# Patient Record
Sex: Male | Born: 1950 | Race: White | Hispanic: No | Marital: Single | Smoking: Never smoker
Health system: Southern US, Community
[De-identification: ages and names within clinical notes are randomized; demographics above are authoritative.]

## PROBLEM LIST (undated history)

## (undated) DIAGNOSIS — M6281 Muscle weakness (generalized): Secondary | ICD-10-CM

## (undated) DIAGNOSIS — E871 Hypo-osmolality and hyponatremia: Secondary | ICD-10-CM

## (undated) DIAGNOSIS — D649 Anemia, unspecified: Secondary | ICD-10-CM

## (undated) DIAGNOSIS — C8330 Diffuse large B-cell lymphoma, unspecified site: Secondary | ICD-10-CM

## (undated) DIAGNOSIS — R1310 Dysphagia, unspecified: Secondary | ICD-10-CM

## (undated) DIAGNOSIS — D72829 Elevated white blood cell count, unspecified: Secondary | ICD-10-CM

## (undated) DIAGNOSIS — R2681 Unsteadiness on feet: Secondary | ICD-10-CM

## (undated) DIAGNOSIS — D179 Benign lipomatous neoplasm, unspecified: Secondary | ICD-10-CM

## (undated) DIAGNOSIS — N179 Acute kidney failure, unspecified: Secondary | ICD-10-CM

## (undated) DIAGNOSIS — S069X9A Unspecified intracranial injury with loss of consciousness of unspecified duration, initial encounter: Secondary | ICD-10-CM

## (undated) HISTORY — PX: APPENDECTOMY: SHX54

---
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
## 1974-07-25 DIAGNOSIS — S069X9A Unspecified intracranial injury with loss of consciousness of unspecified duration, initial encounter: Secondary | ICD-10-CM

## 1974-07-25 DIAGNOSIS — S069XAA Unspecified intracranial injury with loss of consciousness status unknown, initial encounter: Secondary | ICD-10-CM

## 1974-07-25 HISTORY — PX: BRAIN SURGERY: SHX531

## 1974-07-25 HISTORY — DX: Unspecified intracranial injury with loss of consciousness of unspecified duration, initial encounter: S06.9X9A

## 1974-07-25 HISTORY — DX: Unspecified intracranial injury with loss of consciousness status unknown, initial encounter: S06.9XAA

## 2016-12-01 DIAGNOSIS — L720 Epidermal cyst: Secondary | ICD-10-CM | POA: Diagnosis not present

## 2017-03-25 DIAGNOSIS — E860 Dehydration: Secondary | ICD-10-CM | POA: Diagnosis present

## 2017-03-25 DIAGNOSIS — R1310 Dysphagia, unspecified: Secondary | ICD-10-CM

## 2017-03-25 HISTORY — DX: Dysphagia, unspecified: R13.10

## 2017-03-27 ENCOUNTER — Emergency Department (HOSPITAL_COMMUNITY): Payer: Medicare HMO

## 2017-03-27 ENCOUNTER — Encounter (HOSPITAL_COMMUNITY): Payer: Self-pay | Admitting: Internal Medicine

## 2017-03-27 ENCOUNTER — Inpatient Hospital Stay (HOSPITAL_COMMUNITY)
Admission: EM | Admit: 2017-03-27 | Discharge: 2017-04-11 | DRG: 823 | Disposition: A | Discharge status: Skilled Nursing Facility | Payer: Medicare HMO | Attending: Family Medicine | Admitting: Family Medicine

## 2017-03-27 ENCOUNTER — Inpatient Hospital Stay (HOSPITAL_COMMUNITY): Payer: Medicare HMO

## 2017-03-27 DIAGNOSIS — C8588 Other specified types of non-Hodgkin lymphoma, lymph nodes of multiple sites: Secondary | ICD-10-CM | POA: Diagnosis not present

## 2017-03-27 DIAGNOSIS — R911 Solitary pulmonary nodule: Secondary | ICD-10-CM

## 2017-03-27 DIAGNOSIS — R740 Nonspecific elevation of levels of transaminase and lactic acid dehydrogenase [LDH]: Secondary | ICD-10-CM | POA: Diagnosis present

## 2017-03-27 DIAGNOSIS — R2681 Unsteadiness on feet: Secondary | ICD-10-CM | POA: Diagnosis not present

## 2017-03-27 DIAGNOSIS — D72829 Elevated white blood cell count, unspecified: Secondary | ICD-10-CM

## 2017-03-27 DIAGNOSIS — C8580 Other specified types of non-Hodgkin lymphoma, unspecified site: Secondary | ICD-10-CM

## 2017-03-27 DIAGNOSIS — E860 Dehydration: Secondary | ICD-10-CM | POA: Diagnosis present

## 2017-03-27 DIAGNOSIS — R591 Generalized enlarged lymph nodes: Secondary | ICD-10-CM

## 2017-03-27 DIAGNOSIS — C8518 Unspecified B-cell lymphoma, lymph nodes of multiple sites: Secondary | ICD-10-CM | POA: Diagnosis not present

## 2017-03-27 DIAGNOSIS — M6281 Muscle weakness (generalized): Secondary | ICD-10-CM | POA: Diagnosis not present

## 2017-03-27 DIAGNOSIS — R945 Abnormal results of liver function studies: Secondary | ICD-10-CM | POA: Diagnosis present

## 2017-03-27 DIAGNOSIS — D7589 Other specified diseases of blood and blood-forming organs: Secondary | ICD-10-CM | POA: Diagnosis not present

## 2017-03-27 DIAGNOSIS — J90 Pleural effusion, not elsewhere classified: Secondary | ICD-10-CM

## 2017-03-27 DIAGNOSIS — E871 Hypo-osmolality and hyponatremia: Secondary | ICD-10-CM | POA: Diagnosis not present

## 2017-03-27 DIAGNOSIS — N2889 Other specified disorders of kidney and ureter: Secondary | ICD-10-CM | POA: Diagnosis not present

## 2017-03-27 DIAGNOSIS — E43 Unspecified severe protein-calorie malnutrition: Secondary | ICD-10-CM | POA: Diagnosis present

## 2017-03-27 DIAGNOSIS — R32 Unspecified urinary incontinence: Secondary | ICD-10-CM | POA: Diagnosis present

## 2017-03-27 DIAGNOSIS — L89152 Pressure ulcer of sacral region, stage 2: Secondary | ICD-10-CM | POA: Diagnosis present

## 2017-03-27 DIAGNOSIS — H6123 Impacted cerumen, bilateral: Secondary | ICD-10-CM | POA: Diagnosis present

## 2017-03-27 DIAGNOSIS — R599 Enlarged lymph nodes, unspecified: Secondary | ICD-10-CM

## 2017-03-27 DIAGNOSIS — R531 Weakness: Secondary | ICD-10-CM

## 2017-03-27 DIAGNOSIS — D649 Anemia, unspecified: Secondary | ICD-10-CM | POA: Diagnosis not present

## 2017-03-27 DIAGNOSIS — Z5111 Encounter for antineoplastic chemotherapy: Secondary | ICD-10-CM | POA: Diagnosis not present

## 2017-03-27 DIAGNOSIS — I313 Pericardial effusion (noninflammatory): Secondary | ICD-10-CM | POA: Diagnosis not present

## 2017-03-27 DIAGNOSIS — Z87820 Personal history of traumatic brain injury: Secondary | ICD-10-CM

## 2017-03-27 DIAGNOSIS — C8330 Diffuse large B-cell lymphoma, unspecified site: Secondary | ICD-10-CM | POA: Diagnosis not present

## 2017-03-27 DIAGNOSIS — Z6820 Body mass index (BMI) 20.0-20.9, adult: Secondary | ICD-10-CM | POA: Diagnosis not present

## 2017-03-27 DIAGNOSIS — C8510 Unspecified B-cell lymphoma, unspecified site: Secondary | ICD-10-CM | POA: Diagnosis not present

## 2017-03-27 DIAGNOSIS — N189 Chronic kidney disease, unspecified: Secondary | ICD-10-CM | POA: Diagnosis not present

## 2017-03-27 DIAGNOSIS — C8250 Diffuse follicle center lymphoma, unspecified site: Secondary | ICD-10-CM | POA: Diagnosis not present

## 2017-03-27 DIAGNOSIS — N179 Acute kidney failure, unspecified: Secondary | ICD-10-CM | POA: Diagnosis not present

## 2017-03-27 DIAGNOSIS — Z95828 Presence of other vascular implants and grafts: Secondary | ICD-10-CM | POA: Diagnosis not present

## 2017-03-27 DIAGNOSIS — E278 Other specified disorders of adrenal gland: Secondary | ICD-10-CM | POA: Diagnosis present

## 2017-03-27 DIAGNOSIS — R7402 Elevation of levels of lactic acid dehydrogenase (LDH): Secondary | ICD-10-CM

## 2017-03-27 DIAGNOSIS — I6789 Other cerebrovascular disease: Secondary | ICD-10-CM | POA: Diagnosis not present

## 2017-03-27 DIAGNOSIS — R1311 Dysphagia, oral phase: Secondary | ICD-10-CM | POA: Diagnosis not present

## 2017-03-27 DIAGNOSIS — R1310 Dysphagia, unspecified: Secondary | ICD-10-CM | POA: Diagnosis present

## 2017-03-27 DIAGNOSIS — R590 Localized enlarged lymph nodes: Secondary | ICD-10-CM | POA: Diagnosis not present

## 2017-03-27 DIAGNOSIS — J9811 Atelectasis: Secondary | ICD-10-CM | POA: Diagnosis present

## 2017-03-27 DIAGNOSIS — R4182 Altered mental status, unspecified: Secondary | ICD-10-CM | POA: Diagnosis not present

## 2017-03-27 DIAGNOSIS — R109 Unspecified abdominal pain: Secondary | ICD-10-CM | POA: Diagnosis not present

## 2017-03-27 DIAGNOSIS — Z452 Encounter for adjustment and management of vascular access device: Secondary | ICD-10-CM | POA: Diagnosis not present

## 2017-03-27 DIAGNOSIS — R221 Localized swelling, mass and lump, neck: Secondary | ICD-10-CM | POA: Diagnosis not present

## 2017-03-27 DIAGNOSIS — N1330 Unspecified hydronephrosis: Secondary | ICD-10-CM | POA: Diagnosis not present

## 2017-03-27 DIAGNOSIS — L8990 Pressure ulcer of unspecified site, unspecified stage: Secondary | ICD-10-CM | POA: Insufficient documentation

## 2017-03-27 DIAGNOSIS — C8590 Non-Hodgkin lymphoma, unspecified, unspecified site: Secondary | ICD-10-CM | POA: Diagnosis not present

## 2017-03-27 DIAGNOSIS — L049 Acute lymphadenitis, unspecified: Secondary | ICD-10-CM | POA: Diagnosis not present

## 2017-03-27 DIAGNOSIS — C8519 Unspecified B-cell lymphoma, extranodal and solid organ sites: Secondary | ICD-10-CM | POA: Diagnosis not present

## 2017-03-27 DIAGNOSIS — R918 Other nonspecific abnormal finding of lung field: Secondary | ICD-10-CM | POA: Diagnosis not present

## 2017-03-27 DIAGNOSIS — R488 Other symbolic dysfunctions: Secondary | ICD-10-CM | POA: Diagnosis not present

## 2017-03-27 DIAGNOSIS — R944 Abnormal results of kidney function studies: Secondary | ICD-10-CM | POA: Diagnosis not present

## 2017-03-27 HISTORY — DX: Unspecified intracranial injury with loss of consciousness of unspecified duration, initial encounter: S06.9X9A

## 2017-03-27 HISTORY — DX: Benign lipomatous neoplasm, unspecified: D17.9

## 2017-03-27 HISTORY — DX: Dysphagia, unspecified: R13.10

## 2017-03-27 LAB — DIFFERENTIAL
Basophils Absolute: 0 10*3/uL (ref 0.0–0.1)
Basophils Relative: 0 %
EOS PCT: 1 %
Eosinophils Absolute: 0.1 10*3/uL (ref 0.0–0.7)
LYMPHS ABS: 0.9 10*3/uL (ref 0.7–4.0)
LYMPHS PCT: 6 %
MONO ABS: 1.3 10*3/uL — AB (ref 0.1–1.0)
Monocytes Relative: 8 %
Neutro Abs: 13.1 10*3/uL — ABNORMAL HIGH (ref 1.7–7.7)
Neutrophils Relative %: 85 %

## 2017-03-27 LAB — COMPREHENSIVE METABOLIC PANEL
ALK PHOS: 84 U/L (ref 38–126)
ALT: 28 U/L (ref 17–63)
AST: 38 U/L (ref 15–41)
Albumin: 2.6 g/dL — ABNORMAL LOW (ref 3.5–5.0)
Anion gap: 8 (ref 5–15)
BILIRUBIN TOTAL: 0.7 mg/dL (ref 0.3–1.2)
BUN: 31 mg/dL — AB (ref 6–20)
CHLORIDE: 100 mmol/L — AB (ref 101–111)
CO2: 29 mmol/L (ref 22–32)
CREATININE: 2.01 mg/dL — AB (ref 0.61–1.24)
Calcium: 11.4 mg/dL — ABNORMAL HIGH (ref 8.9–10.3)
GFR calc Af Amer: 38 mL/min — ABNORMAL LOW (ref >60)
GFR, EST NON AFRICAN AMERICAN: 33 mL/min — AB (ref >60)
Glucose, Bld: 114 mg/dL — ABNORMAL HIGH (ref 65–99)
Potassium: 4.1 mmol/L (ref 3.5–5.1)
Sodium: 137 mmol/L (ref 135–145)
Total Protein: 6.6 g/dL (ref 6.5–8.1)

## 2017-03-27 LAB — CREATININE, URINE, RANDOM: Creatinine, Urine: 109.9 mg/dL

## 2017-03-27 LAB — CBC
HEMATOCRIT: 33.6 % — AB (ref 39.0–52.0)
HEMOGLOBIN: 10.7 g/dL — AB (ref 13.0–17.0)
MCH: 28.9 pg (ref 26.0–34.0)
MCHC: 31.8 g/dL (ref 30.0–36.0)
MCV: 90.8 fL (ref 78.0–100.0)
Platelets: 437 10*3/uL — ABNORMAL HIGH (ref 150–400)
RBC: 3.7 MIL/uL — AB (ref 4.22–5.81)
RDW: 13.9 % (ref 11.5–15.5)
WBC: 14.4 10*3/uL — AB (ref 4.0–10.5)

## 2017-03-27 LAB — URINALYSIS, ROUTINE W REFLEX MICROSCOPIC
Bilirubin Urine: NEGATIVE
GLUCOSE, UA: NEGATIVE mg/dL
Hgb urine dipstick: NEGATIVE
Ketones, ur: NEGATIVE mg/dL
LEUKOCYTES UA: NEGATIVE
Nitrite: NEGATIVE
PH: 6 (ref 5.0–8.0)
PROTEIN: NEGATIVE mg/dL
Specific Gravity, Urine: 1.013 (ref 1.005–1.030)

## 2017-03-27 LAB — PHOSPHORUS: Phosphorus: 4.3 mg/dL (ref 2.5–4.6)

## 2017-03-27 LAB — SODIUM, URINE, RANDOM: SODIUM UR: 29 mmol/L

## 2017-03-27 LAB — MAGNESIUM: MAGNESIUM: 2.1 mg/dL (ref 1.7–2.4)

## 2017-03-27 LAB — I-STAT CG4 LACTIC ACID, ED: Lactic Acid, Venous: 1.33 mmol/L (ref 0.5–1.9)

## 2017-03-27 LAB — TSH: TSH: 1.929 u[IU]/mL (ref 0.350–4.500)

## 2017-03-27 LAB — I-STAT TROPONIN, ED: TROPONIN I, POC: 0 ng/mL (ref 0.00–0.08)

## 2017-03-27 LAB — SAVE SMEAR

## 2017-03-27 MED ORDER — VANCOMYCIN HCL IN DEXTROSE 1-5 GM/200ML-% IV SOLN
1000.0000 mg | Freq: Once | INTRAVENOUS | Status: DC
  Filled 2017-03-27: qty 200

## 2017-03-27 MED ORDER — PIPERACILLIN-TAZOBACTAM 3.375 G IVPB: 3.3750 g | Freq: Three times a day (TID) | INTRAVENOUS | Status: DC

## 2017-03-27 MED ORDER — SODIUM CHLORIDE 0.9 % IV SOLN
1000.0000 mL | INTRAVENOUS | Status: DC
  Administered 2017-03-27: 1000 mL via INTRAVENOUS

## 2017-03-27 MED ORDER — PIPERACILLIN-TAZOBACTAM 3.375 G IVPB 30 MIN
3.3750 g | Freq: Once | INTRAVENOUS | Status: DC
  Administered 2017-03-27: 3.375 g via INTRAVENOUS
  Filled 2017-03-27: qty 50

## 2017-03-27 MED ORDER — HEPARIN SODIUM (PORCINE) 5000 UNIT/ML IJ SOLN
5000.0000 [IU] | Freq: Three times a day (TID) | INTRAMUSCULAR | Status: DC
  Administered 2017-03-27 – 2017-03-28 (×4): 5000 [IU] via SUBCUTANEOUS
  Filled 2017-03-27 (×4): qty 1

## 2017-03-27 MED ORDER — ACETAMINOPHEN 325 MG PO TABS: 650.0000 mg | ORAL_TABLET | Freq: Four times a day (QID) | ORAL | Status: DC | PRN

## 2017-03-27 MED ORDER — POTASSIUM CHLORIDE IN NACL 20-0.9 MEQ/L-% IV SOLN
INTRAVENOUS | Status: AC
  Administered 2017-03-27 (×2): via INTRAVENOUS
  Filled 2017-03-27 (×3): qty 1000

## 2017-03-27 MED ORDER — ACETAMINOPHEN 650 MG RE SUPP: 650.0000 mg | Freq: Four times a day (QID) | RECTAL | Status: DC | PRN

## 2017-03-27 MED ORDER — VANCOMYCIN HCL IN DEXTROSE 1-5 GM/200ML-% IV SOLN: 1000.0000 mg | INTRAVENOUS | Status: DC

## 2017-03-27 MED ORDER — THIAMINE HCL 100 MG/ML IJ SOLN
200.0000 mg | Freq: Every day | INTRAMUSCULAR | Status: DC
  Administered 2017-03-27 – 2017-03-28 (×2): 200 mg via INTRAVENOUS
  Filled 2017-03-27 (×2): qty 2
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
## 2017-03-27 NOTE — ED Notes (Signed)
Patient refusing urinary catheter at this time.  States he does not need to urinate and does not want to be catheterized.
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
## 2017-03-27 NOTE — ED Notes (Signed)
Patient transported to X-ray 

## 2017-03-27 NOTE — Progress Notes (Signed)
Admission note:  Arrival Method: via stretcher from ED Mental Orientation: Alert and oriented x1. Telemetry: box #6, CCMD notified. Assessment: see doc flow sheet Skin: warm and dry.  IV: L AC NSL. Pain: 0-10. Safety Measures: discussed and reviewed with pt and family, verbalized understanding.  Fall Prevention Safety Plan: bed alarm on, call bell and telephone within reach. Admission Screening: in progress  6700 Orientation: Patient has been oriented to the unit, staff and to the room.
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
## 2017-03-27 NOTE — ED Provider Notes (Signed)
Level V caveat patient brain injured. History is obtained from his family who accompanies him and from paramedics. Patient has been choking on his food and has been eating less for the past 1 month. Today he was too weak to get off the toilet and he has had trouble walking due to generalized weakness progressively worsening for approximately the past week. Patient denies pain anywhere. Denies shortness of breath. On exam he is alert.Marland Kitchen HEENT exam no facial asymmetry, mucous membranes dry Moves all extremity as well. Cranial nerves II through XII intact. Lungs clear auscultation abdomen soft nontender. Heart tachycardic, regular rhythm   Orlie Dakin, MD 03/27/17 1146

## 2017-03-27 NOTE — Progress Notes (Signed)
Pharmacy Antibiotic Note  Tyler [name] is a 66 y.o. male admitted on 03/27/2017 with sepsis.    Plan: Zosyn 3.375 gm iv q8h Van 1 g q24h  Monitor renal fx, cx, vt prn     Temp (24hrs), Avg:97.1 F (36.2 C), Min:96 F (35.6 C), Max:98.1 F (36.7 C)   Recent Labs Lab 03/27/17 1134 03/27/17 1150  WBC 14.4*  --   CREATININE 2.01*  --   LATICACIDVEN  --  1.33    CrCl cannot be calculated (Unknown ideal weight.).    Allergies not on file  Levester Fresh, PharmD, BCPS, BCCCP Clinical Pharmacist Clinical phone for 03/27/2017 from 7a-3:30p: [phone] If after 3:30p, please call main pharmacy at: [phone] 03/27/2017 1:47 PM

## 2017-03-27 NOTE — ED Notes (Signed)
Bladder scan results: > 431mL

## 2017-03-28 ENCOUNTER — Encounter (HOSPITAL_COMMUNITY): Payer: Self-pay

## 2017-03-28 ENCOUNTER — Inpatient Hospital Stay (HOSPITAL_COMMUNITY): Payer: Medicare HMO

## 2017-03-28 DIAGNOSIS — L8990 Pressure ulcer of unspecified site, unspecified stage: Secondary | ICD-10-CM | POA: Insufficient documentation

## 2017-03-28 LAB — COMPREHENSIVE METABOLIC PANEL
ALT: 47 U/L (ref 17–63)
AST: 55 U/L — AB (ref 15–41)
Albumin: 2.4 g/dL — ABNORMAL LOW (ref 3.5–5.0)
Alkaline Phosphatase: 98 U/L (ref 38–126)
Anion gap: 7 (ref 5–15)
BILIRUBIN TOTAL: 1.4 mg/dL — AB (ref 0.3–1.2)
BUN: 30 mg/dL — ABNORMAL HIGH (ref 6–20)
CHLORIDE: 105 mmol/L (ref 101–111)
CO2: 27 mmol/L (ref 22–32)
Calcium: 11.1 mg/dL — ABNORMAL HIGH (ref 8.9–10.3)
Creatinine, Ser: 2.07 mg/dL — ABNORMAL HIGH (ref 0.61–1.24)
GFR, EST AFRICAN AMERICAN: 36 mL/min — AB (ref >60)
GFR, EST NON AFRICAN AMERICAN: 31 mL/min — AB (ref >60)
Glucose, Bld: 93 mg/dL (ref 65–99)
POTASSIUM: 4 mmol/L (ref 3.5–5.1)
Sodium: 139 mmol/L (ref 135–145)
TOTAL PROTEIN: 6.2 g/dL — AB (ref 6.5–8.1)

## 2017-03-28 LAB — CBC
HCT: 32 % — ABNORMAL LOW (ref 39.0–52.0)
Hemoglobin: 10.2 g/dL — ABNORMAL LOW (ref 13.0–17.0)
MCH: 29 pg (ref 26.0–34.0)
MCHC: 31.9 g/dL (ref 30.0–36.0)
MCV: 90.9 fL (ref 78.0–100.0)
PLATELETS: 431 10*3/uL — AB (ref 150–400)
RBC: 3.52 MIL/uL — AB (ref 4.22–5.81)
RDW: 14.1 % (ref 11.5–15.5)
WBC: 12.8 10*3/uL — AB (ref 4.0–10.5)

## 2017-03-28 LAB — KAPPA/LAMBDA LIGHT CHAINS
Kappa free light chain: 77.1 mg/L — ABNORMAL HIGH (ref 3.3–19.4)
Kappa, lambda light chain ratio: 0.73 (ref 0.26–1.65)
Lambda free light chains: 105.2 mg/L — ABNORMAL HIGH (ref 5.7–26.3)

## 2017-03-28 LAB — PTH, INTACT AND CALCIUM
CALCIUM TOTAL (PTH): 11.2 mg/dL — AB (ref 8.6–10.2)
PTH: 7 pg/mL — ABNORMAL LOW (ref 15–65)

## 2017-03-28 MED ORDER — SODIUM CHLORIDE 0.9 % IV SOLN
INTRAVENOUS | Status: DC
  Administered 2017-03-28 – 2017-04-03 (×9): via INTRAVENOUS

## 2017-03-28 MED ORDER — ZOLEDRONIC ACID 4 MG/5ML IV CONC
4.0000 mg | Freq: Once | INTRAVENOUS | Status: AC
  Administered 2017-03-28: 4 mg via INTRAVENOUS
  Filled 2017-03-28: qty 5

## 2017-03-28 MED ORDER — VITAMIN B-1 100 MG PO TABS
200.0000 mg | ORAL_TABLET | Freq: Every day | ORAL | Status: DC
  Administered 2017-03-29: 200 mg via ORAL
  Filled 2017-03-28: qty 2

## 2017-03-28 MED ORDER — ZOLEDRONIC ACID 4 MG/5ML IV CONC
4.0000 mg | Freq: Once | INTRAVENOUS | Status: DC
  Filled 2017-03-28: qty 5

## 2017-03-28 MED ORDER — ENSURE ENLIVE PO LIQD
237.0000 mL | Freq: Two times a day (BID) | ORAL | Status: DC
  Administered 2017-03-28 – 2017-03-31 (×4): 237 mL via ORAL

## 2017-03-28 NOTE — Evaluation (Signed)
Occupational Therapy Evaluation Patient Details Name: Tyler [name] MRN: [id number] DOB: [birth year] Today's Date: 03/28/2017    History of Present Illness 66 y.o. 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   Clinical Impression   At his baseline, pt walks and performs self care independently. He lives in the basement of his aunt and uncle's home. Pt presents with baseline impairment in cognition, generalized weakness, poor activity tolerance and decreased standing balance. He requires min assist for mobility and ADL and is a high fall risk. Pt will need rehab upon discharge in a SNF. His siblings were in the room during the session and have concerns about pt continuing to live with his aunt and uncle as they are elderly. Will follow acutely.    Follow Up Recommendations  SNF;Supervision/Assistance - 24 hour    Equipment Recommendations       Recommendations for Other Services       Precautions / Restrictions Precautions Precautions: Fall Restrictions Weight Bearing Restrictions: No      Mobility Bed Mobility Overal bed mobility: Needs Assistance Bed Mobility: Supine to Sit     Supine to sit: Min guard     General bed mobility comments: Used bedrail; inefficient movement with overall decr weight shifting; got "stuck" while trying to flex trunk and elevate, so held thigh with hands to help with the weight shift towards EOB  Transfers Overall transfer level: Needs assistance Equipment used: Rolling walker (2 wheeled) Transfers: Sit to/from Stand Sit to Stand: Min assist         General transfer comment: Min assist to steady; slow rise; indicated he didn't feel right standing without external support, so brought RW to him    Balance Overall balance assessment: Needs assistance Sitting-balance support: Feet supported Sitting balance-Leahy Scale:  Fair       Standing balance-Leahy Scale: Poor Standing balance comment: Dependent on UE suport                           ADL either performed or assessed with clinical judgement   ADL Overall ADL's : Needs assistance/impaired Eating/Feeding: Set up;Sitting   Grooming: Supervision/safety;Sitting   Upper Body Bathing: Set up;Sitting   Lower Body Bathing: Minimal assistance;Sit to/from stand   Upper Body Dressing : Minimal assistance;Sitting   Lower Body Dressing: Minimal assistance;Sit to/from stand   Toilet Transfer: Minimal assistance;Ambulation;RW   Toileting- Clothing Manipulation and Hygiene: Minimal assistance;Sit to/from stand       Functional mobility during ADLs: Minimal assistance;Rolling walker       Vision Baseline Vision/History: Wears glasses Wears Glasses: Distance only Patient Visual Report: No change from baseline       Perception     Praxis      Pertinent Vitals/Pain Pain Assessment: No/denies pain     Hand Dominance Right   Extremity/Trunk Assessment Upper Extremity Assessment Upper Extremity Assessment: Generalized weakness   Lower Extremity Assessment Lower Extremity Assessment: Generalized weakness       Communication Communication Communication: No difficulties   Cognition Arousal/Alertness: Awake/alert Behavior During Therapy: WFL for tasks assessed/performed Overall Cognitive Status: History of cognitive impairments - at baseline                                     General  Comments       Exercises     Shoulder Instructions      Home Living Family/patient expects to be discharged to:: Skilled nursing facility Living Arrangements: Other relatives                               Additional Comments: elderly aunt and uncle      Prior Functioning/Environment Level of Independence: Independent        Comments: typically walks long distances, relies on assist of aunt and uncle  for IADL        OT Problem List: Decreased strength;Decreased activity tolerance;Impaired balance (sitting and/or standing);Decreased coordination;Decreased cognition;Decreased knowledge of use of DME or AE;Decreased safety awareness      OT Treatment/Interventions: Self-care/ADL training;DME and/or AE instruction;Patient/family education;Balance training;Therapeutic activities    OT Goals(Current goals can be found in the care plan section) Acute Rehab OT Goals Patient Stated Goal: did not state OT Goal Formulation: With patient Time For Goal Achievement: 04/11/17 Potential to Achieve Goals: Good ADL Goals Pt Will Perform Grooming: with supervision;standing Pt Will Perform Upper Body Dressing: with supervision;with set-up;sitting Pt Will Perform Lower Body Dressing: with supervision;with set-up;sit to/from stand Pt Will Transfer to Toilet: with supervision;ambulating;regular height toilet Pt Will Perform Toileting - Clothing Manipulation and hygiene: with supervision;sit to/from stand  OT Frequency: Min 2X/week   Barriers to D/C: Decreased caregiver support          Co-evaluation              AM-PAC PT "6 Clicks" Daily Activity     Outcome Measure Help from another person eating meals?: None Help from another person taking care of personal grooming?: A Little Help from another person toileting, which includes using toliet, bedpan, or urinal?: A Little Help from another person bathing (including washing, rinsing, drying)?: A Little Help from another person to put on and taking off regular upper body clothing?: A Little Help from another person to put on and taking off regular lower body clothing?: A Little 6 Click Score: 19   End of Session Equipment Utilized During Treatment: Gait belt;Rolling walker  Activity Tolerance: Patient tolerated treatment well Patient left: in chair;with call bell/phone within reach;with chair alarm set;with family/visitor present  OT  Visit Diagnosis: Unsteadiness on feet (R26.81);Other abnormalities of gait and mobility (R26.89);Muscle weakness (generalized) (M62.81);Adult, failure to thrive (R62.7);Other symptoms and signs involving cognitive function                Time: 8466-5993 OT Time Calculation (min): 28 min Charges:  OT General Charges $OT Visit: 1 Visit OT Evaluation $OT Eval Moderate Complexity: 1 Mod G-Codes:     03-31-2017 Nestor Lewandowsky, OTR/L Pager: [phone]  Werner Lean, [place] March 31, 2017, 4:29 PM

## 2017-03-28 NOTE — Evaluation (Signed)
Physical Therapy Evaluation Patient Details Name: Tyler [name] MRN: [id number] DOB: [birth year] Today's Date: 03/28/2017   History of Present Illness  66 y.o. 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  Clinical Impression   Pt admitted with above diagnosis. Pt currently with functional limitations due to the deficits listed below (see PT Problem List). Prior to this illness leading to admission, Tyler [name] was able to walk independently community distances; Now requires assist and UE support for steadiness in standing and walking; At this point, SNF is very appropriate to maximize independence and safety with mobility; After post-acute rehab, I agree taht it is time to look for a safe long-term living situation; Pt will benefit from skilled PT to increase their independence and safety with mobility to allow discharge to the venue listed below.       Follow Up Recommendations SNF    Equipment Recommendations  Rolling walker with 5" wheels;3in1 (PT)    Recommendations for Other Services       Precautions / Restrictions Precautions Precautions: Fall      Mobility  Bed Mobility Overal bed mobility: Needs Assistance Bed Mobility: Supine to Sit     Supine to sit: Min guard     General bed mobility comments: Used bedrail; inefficient movement with overall decr weight shifting; got "stuck" while trying to flex trunk and elevate, so held thigh with hands to help with the weight shift towards EOB  Transfers Overall transfer level: Needs assistance Equipment used: None Transfers: Sit to/from Stand Sit to Stand: Min assist         General transfer comment: Min assist to steady; slow rise; indicated he didn't feel right standing without external support, so brought RW to him  Ambulation/Gait Ambulation/Gait assistance: Min assist;Mod assist;+2  safety/equipment Ambulation Distance (Feet): 60 Feet Assistive device: Rolling walker (2 wheeled) Gait Pattern/deviations: Step-through pattern;Trunk flexed   Gait velocity interpretation: Below normal speed for age/gender General Gait Details: tending to have hips, knees and trunk slightly flexed throughout gait cycle; multiple cues for RW proximity and posture; with fatigue, noted tends to have RW too far in front; tended to reach for recliner prematurely  Stairs            Wheelchair Mobility    Modified Rankin (Stroke Patients Only)       Balance Overall balance assessment: Needs assistance Sitting-balance support: Feet supported Sitting balance-Tyler [name] Scale: Fair       Standing balance-Tyler [name] Scale: Poor Standing balance comment: Dependent on UE suport                             Pertinent Vitals/Pain Pain Assessment: No/denies pain    Home Living Family/patient expects to be discharged to:: Skilled nursing facility Living Arrangements: Other relatives                    Prior Function Level of Independence: Independent         Comments: [place]ks to walk     Hand Dominance        Extremity/Trunk Assessment   Upper Extremity Assessment Upper Extremity Assessment: Defer to OT evaluation    Lower Extremity Assessment Lower Extremity Assessment: Generalized weakness       Communication   Communication: No difficulties  Cognition Arousal/Alertness: Awake/alert Behavior During Therapy: Tyler [name] for tasks assessed/performed  Overall Cognitive Status: History of cognitive impairments - at baseline                                        General Comments      Exercises     Assessment/Plan    PT Assessment Patient needs continued PT services  PT Problem List Decreased strength;Decreased activity tolerance;Decreased balance;Decreased mobility;Decreased coordination;Decreased cognition;Decreased knowledge of use of  DME;Decreased safety awareness;Decreased knowledge of precautions       PT Treatment Interventions DME instruction;Gait training;Functional mobility training;Therapeutic activities;Therapeutic exercise;Balance training;Neuromuscular re-education;Cognitive remediation;Patient/family education    PT Goals (Current goals can be found in the Care Plan section)  Acute Rehab PT Goals Patient Stated Goal: did not state PT Goal Formulation: Patient unable to participate in goal setting Time For Goal Achievement: 04/11/17 Potential to Achieve Goals: Good    Frequency Min 2X/week   Barriers to discharge        Co-evaluation               AM-PAC PT "6 Clicks" Daily Activity  Outcome Measure Difficulty turning over in bed (including adjusting bedclothes, sheets and blankets)?: A Lot Difficulty moving from lying on back to sitting on the side of the bed? : A Lot Difficulty sitting down on and standing up from a chair with arms (e.g., wheelchair, bedside commode, etc,.)?: A Lot Help needed moving to and from a bed to chair (including a wheelchair)?: A Lot Help needed walking in hospital room?: A Lot Help needed climbing 3-5 steps with a railing? : A Lot 6 Click Score: 12    End of Session Equipment Utilized During Treatment: Gait belt Activity Tolerance: Patient tolerated treatment well Patient left: in chair;with call bell/phone within reach;with chair alarm set;with family/visitor present Nurse Communication: Mobility status PT Visit Diagnosis: Unsteadiness on feet (R26.81);Other abnormalities of gait and mobility (R26.89);Muscle weakness (generalized) (M62.81)    Time: 7510-2585 PT Time Calculation (min) (ACUTE ONLY): 32 min   Charges:   PT Evaluation $PT Eval Moderate Complexity: 1 Mod     PT G Codes:        Tyler [name], PT  Acute Rehabilitation Services Pager [phone] Office [phone]   Tyler [name] 03/28/2017, 4:13 PM

## 2017-03-28 NOTE — Consult Note (Signed)
[facility] Nurse wound consult note Reason for Consult: Consult requested for sacrum.   Wound type: Stage 2 pressure injury noted as present on admission to sacrum by the bedside nurse.   Pressure Injury POA: Yes Measurement: .4X.4X.1cm Wound bed: pink and dry Drainage (amount, consistency, odor) no odor or drainage Periwound: intact skin surrounding Dressing procedure/placement/frequency: Foam dressing to protect and promote healing. Please re-consult if further assistance is needed.  Thank-you,  Julien Girt MSN, [place], [place], [place], [place]

## 2017-03-28 NOTE — Progress Notes (Signed)
PT Cancellation Note  Patient Details Name: SANEL STEMMER MRN: [id number] DOB: 1951-01-24   Cancelled Treatment:    Reason Eval/Treat Not Completed: Patient at procedure or test/unavailable   At time of attempt, going off floor for xray;   Will follow up later today as time allows;  Otherwise, will follow up for PT tomorrow;   Thank you,  Roney Marion, PT  Acute Rehabilitation Services Pager [phone] Office [place] 03/28/2017, 11:33 AM
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
## 2017-03-28 NOTE — Progress Notes (Signed)
OT Cancellation Note  Patient Details Name: Tyler [name] MRN: [id number] DOB: 12-08-1950   Cancelled Treatment:    Reason Eval/Treat Not Completed: Patient at procedure or test/ unavailable (Pt leaving for xray. Will reattempt.)  Malka So 03/28/2017, 10:55 AM  03/28/2017 Nestor Lewandowsky, OTR/L Pager: [phone]

## 2017-03-28 NOTE — Consult Note (Signed)
[facility] CM Primary Care Navigator  03/28/2017  CORGAN MORMILE 1951-06-30 [id number]   Met with patient, sister Izora Gala) and brother [name]) at the bedside to identify possible discharge needs. Patient noted to have confusion and information were verified with patient's uncle by his brother and sister. Both sister and brother (from out of state) report that patient had "weakness - unable to get up/ walk" and "loss of appetite" that had led to this admission. Patientendorsed Dr. Jenean Lindau or [facility]- whom he use to see from years ago as primary care provider with [facility] at [facility] (called and confirmed with Shirlean Mylar and states will be able to see patient back at the office if needed).    Patient's uncle confirmed that patient has not been on any medications at home.  Sister states using [facility] in [place] if he needs any "immediate medications".  Patient's brother states that uncle has been providingtransportation for patient, otherwise, patient "walks around town".  [employer] transportation benefits discussed with patient and siblings.  Patient lives with aunt Angelita Ingles) and uncle Shireen Quan provides assistance with his needs at home.  Discharge plan is undetermined for now per brother and sister.  Still awaiting for PT/ OT evaluation/ recommendation and physician order at this time.  Patient's sister and brother voiced understanding to callprimary provider's officewhen patientgets home,to schedulea post discharge follow-upwithin a week or sooner if needed. Patient letter (with primary provider's contact number) was provided as a reminder.  Explained to patient, brother and sister regarding[facility] CM services available for health management but they denied any current needs or concerns at this time. They expressedunderstandingto seek referral from primary provider to [facility] care management if deemed necessaryfor services in the  future.  [facility] care management information provided for future needs that may arise.  For questions, please contact:  Dannielle Huh, BSN, RN- [facility] Primary Care Navigator  Telephone: [phone] [place]

## 2017-03-28 NOTE — Progress Notes (Signed)
Medicine attending: I examined this patient today together with resident physician Dr. Ina Homes and I concur with his evaluation and management plan which we discussed together.  Ongoing evaluation for unexplained weakness, weight loss, and hypercalcemia.  Please see separate attending admission note for complete details.

## 2017-03-28 NOTE — Progress Notes (Signed)
Initial Nutrition Assessment  DOCUMENTATION CODES:   Severe malnutrition in context of chronic illness  INTERVENTION:   Ensure Enlive po BID, each supplement provides 350 kcal and 20 grams of protein  Magic cup BID with meals, each supplement provides 290 kcal and 9 grams of protein   NUTRITION DIAGNOSIS:   Malnutrition (Severe) related to chronic illness (suspected malignancy with hypercalcemia, wt loss, dysphagia, weakness) as evidenced by severe depletion of body fat, severe depletion of muscle mass.  GOAL:   Patient will meet greater than or equal to 90% of their needs  MONITOR:   PO intake, Supplement acceptance, Labs, Weight trends  REASON FOR ASSESSMENT:   Consult Assessment of nutrition requirement/status  ASSESSMENT:   66 yo male admitted with generalized weakness, dysphagia and w tloss of 1 month duration, hypercalcemia. Pt with hx of TBI over 40 years ago  Uncle reporting that over the past month pt had stopped eating and appears to gag on food per MD notes. On visit today, pt reports he eats 3-4 meals per day. He states he eats eggs at breakfast and some other "stuff" and then he eats at "[employer] for lunch. But pt unable to elaborate much more regarding po intake.  Noted pt oriented x 2, definitely appeared confused on visit today.  Diet advanced to Regular this AM post SLP evaluation, pt had not eaten breakfast on visit today  Pt reports UBW around 170-180 pounds but current wt 151 pounds. >/= 11% wt loss, Unsure of time frame, no previous wt encounters  Nutrition-Focused physical exam completed. Findings are mild/moderate to severe fat depletion, mild/moderate to severe muscle depletion, and no edema.   Labs: corrected calcium 12.4 (albumin 2.4 Meds: NS with KCl at 125 ml/hr, thiamine, zometa  Diet Order:  Diet regular Room service appropriate? Yes; Fluid consistency: Thin  Skin:  Wound (see comment) (stage II buttock)  Last BM:  no documented  BM  Height:   Ht Readings from Last 1 Encounters:  03/28/17 6' (1.829 m)    Weight:   Wt Readings from Last 1 Encounters:  03/28/17 151 lb 14.4 oz (68.9 kg)    Ideal Body Weight:     BMI:  Body mass index is 20.6 kg/m.  Estimated Nutritional Needs:   Kcal:  1900-2100 kcals  Protein:  95-105 g  Fluid:  >/= 1.9 L  EDUCATION NEEDS:   No education needs identified at this time  [place], [place], LDN [phone] Pager  [phone] Weekend/On-Call Pager

## 2017-03-28 NOTE — Progress Notes (Addendum)
   Subjective: Doing well this AM. Has no questions or concerns. Continues to state that he has no issues eating/drinking and has been doing fine at home. Alert and oriented but concentration diminished. Denies urinary retention or incontinence, muscle weakness, dysphagia.   Objective: Vital signs in last 24 hours: Vitals:   03/27/17 1521 03/27/17 1718 03/27/17 2225 03/28/17 0519  BP: 98/75 124/73 125/65 128/68  Pulse: (!) 111 (!) 108 (!) 104 (!) 107  Resp: 18 17 (!) 24 (!) 24  Temp: 99 F (37.2 C) 99 F (37.2 C) 98.2 F (36.8 C) 98.3 F (36.8 C)  TempSrc: Oral Oral Oral Oral  SpO2: 99% 100% 98% 97%  Weight:   151 lb 14.4 oz (68.9 kg)    Physical Exam  Constitutional: He is oriented to person, place, and time.  Cachetic appearing  HENT:  Mouth/Throat: Oropharynx is clear and moist.  Cardiovascular: Normal rate, regular rhythm, normal heart sounds and intact distal pulses.   Pulmonary/Chest: Effort normal and breath sounds normal. He has no wheezes.  Abdominal: Soft. Bowel sounds are normal.  Musculoskeletal: He exhibits no edema.  Neurological: He is alert and oriented to person, place, and time.  Patellar reflexes diminished in LEs   Assessment/Plan: Active Problems:   Generalized weakness   History of traumatic brain injury   Hypercalcemia   AKI (acute kidney injury) ([place])   Normocytic anemia  1. Hypercalcemia  - Corrected Ca 12.5 with albumin 2.4  - Protein gap < 4, Alk phos 98, PTH appropriately suppressed at 7, TSH WNL - Checking SPEP and Kappa/Lambda light chains - Primary concern right now is malignancy. With his AKI and anemia we need to rule out plasma cell dyscrasias including MM. However, with his symptoms of dysphagia a barium swallow study would help to rule out squamous cell esophageal cancer. - Will continue work-up. Dehydration can cause hypercalcemia, however, with several hours of IVF his calcium has not decreased therefore it is less likely due to  dehydration   - Treat with Zoledronic acid 4 mg over 30 minutes  - Speech consult: no overt signs of dysphagia or aspiration will resume regular diet  2. Generalized Weakness  - Associated symptoms include decreased PO intake due to dysphagia, urinary retention, and weight loss - No health screening in the past   - CT head illustrating no acute infarct, generalized atrophy, dilated ventricles out of proportion to the amount of atrophy indicating possible NPH.  - Likely 2/2 hypercalcemia  3. AKI - Baseline creatinine unknown, Cr 2.01 on admission  - Trend: 2.01 -> 2.09 - FeNa indicative of pre-renal pathology  - UA unremarkable with no protein, hgb, RBCs, or casts - Renal ultrasound showing right hydronephrosis with no hypoechogenicity  - Abdominal x-ray - Continue IVF and rule out MM  Dispo: Anticipated discharge in approximately > 1 day(s).   Ina Homes, MD 03/28/2017, 6:32 AM My Pager: [phone]

## 2017-03-28 NOTE — Evaluation (Signed)
Clinical/Bedside Swallow Evaluation Patient Details  Name: Tyler [name] MRN: [id number] Date of Birth: 08-Sep-1950  Today's Date: 03/28/2017 Time: SLP Start Time (ACUTE ONLY): 0865 SLP Stop Time (ACUTE ONLY): 0951 SLP Time Calculation (min) (ACUTE ONLY): 20 min  Past Medical History:  Past Medical History:  Diagnosis Date  . AKI (acute kidney injury) ([place]) 03/2017  . Dehydration 03/2017  . Dysphagia 03/2017  . Family history of adverse reaction to anesthesia    sister has nausea  . Fatty tumor   . History of traumatic brain injury    Past Surgical History:  Past Surgical History:  Procedure Laterality Date  . APPENDECTOMY    . BRAIN SURGERY  1976   HPI:  66 y.o. 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 Per MD notes, uncle states that over the last month he has stopped eating and appears to gag on food. The gagging does not always occur and he does not appear to have the same problem with drinking fluids. He has since become progressively weak.  Started on sepsis protocol, undergoing w/u.    Assessment / Plan / Recommendation Clinical Impression  Pt presents with functional oropharyngeal swallow marked by strong mastication, brisk swallow response, no overt s/s of aspiration.  No focal deficits; no issues with swallowing/respiratory reciprocity.  Pt passed three oz water test without deficit.  No indication today of dysphagia -per chart review, symptoms are intermittent.  For now, recommend resuming a regular diet, thin liquids. Please reconsult SLP if s/s of dysphagia recur.   SLP Visit Diagnosis: Dysphagia, unspecified (R13.10)    Aspiration Risk  No limitations    Diet Recommendation     Medication Administration: Whole meds with liquid    Other  Recommendations Oral Care Recommendations: Oral care BID   Follow up Recommendations        Frequency and  Duration            Prognosis        Swallow Study   General HPI: 66 y.o. 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 Uncle at bedside, states that over the last month he has stopped eating and appears to gag on food. The gagging does not always occur and he does not appear to have the same problem with drinking fluids. He has since become progressively weak.  Started on sepsis protocol, undergoing w/u.  Type of Study: Bedside Swallow Evaluation Previous Swallow Assessment: none Diet Prior to this Study: NPO Temperature Spikes Noted: No Respiratory Status: Room air History of Recent Intubation: No Behavior/Cognition: Alert;Cooperative Oral Cavity Assessment: Within Functional Limits Oral Care Completed by SLP: No Oral Cavity - Dentition: Adequate natural dentition Vision: Functional for self-feeding Self-Feeding Abilities: Able to feed self Patient Positioning: Upright in bed Baseline Vocal Quality: Normal Volitional Cough: Strong Volitional Swallow: Able to elicit    Oral/Motor/Sensory Function Overall Oral Motor/Sensory Function: Within functional limits   Ice Chips Ice chips: Within functional limits   Thin Liquid Thin Liquid: Within functional limits    Nectar Thick Nectar Thick Liquid: Not tested   Honey Thick Honey Thick Liquid: Not tested   Puree Puree: Within functional limits   Solid   GO   Solid: Within functional limits        Tyler [name],  Tyler [name] 03/28/2017,9:51 AM  Estill Bamberg L. Tivis Ringer, [place] CCC/SLP Pager [phone]

## 2017-03-29 ENCOUNTER — Inpatient Hospital Stay (HOSPITAL_COMMUNITY): Payer: Medicare HMO

## 2017-03-29 DIAGNOSIS — E860 Dehydration: Secondary | ICD-10-CM

## 2017-03-29 DIAGNOSIS — R221 Localized swelling, mass and lump, neck: Secondary | ICD-10-CM

## 2017-03-29 LAB — BASIC METABOLIC PANEL
Anion gap: 6 (ref 5–15)
BUN: 19 mg/dL (ref 6–20)
CHLORIDE: 102 mmol/L (ref 101–111)
CO2: 27 mmol/L (ref 22–32)
CREATININE: 1.63 mg/dL — AB (ref 0.61–1.24)
Calcium: 9.8 mg/dL (ref 8.9–10.3)
GFR calc non Af Amer: 42 mL/min — ABNORMAL LOW (ref >60)
GFR, EST AFRICAN AMERICAN: 49 mL/min — AB (ref >60)
Glucose, Bld: 104 mg/dL — ABNORMAL HIGH (ref 65–99)
POTASSIUM: 3.8 mmol/L (ref 3.5–5.1)
SODIUM: 135 mmol/L (ref 135–145)

## 2017-03-29 LAB — HEPATIC FUNCTION PANEL
ALBUMIN: 2.2 g/dL — AB (ref 3.5–5.0)
ALK PHOS: 121 U/L (ref 38–126)
ALT: 111 U/L — AB (ref 17–63)
AST: 141 U/L — AB (ref 15–41)
BILIRUBIN DIRECT: 1.1 mg/dL — AB (ref 0.1–0.5)
BILIRUBIN TOTAL: 1.7 mg/dL — AB (ref 0.3–1.2)
Indirect Bilirubin: 0.6 mg/dL (ref 0.3–0.9)
Total Protein: 5.6 g/dL — ABNORMAL LOW (ref 6.5–8.1)

## 2017-03-29 LAB — LACTATE DEHYDROGENASE: LDH: 262 U/L — ABNORMAL HIGH (ref 98–192)

## 2017-03-29 LAB — CBC
HEMATOCRIT: 32.1 % — AB (ref 39.0–52.0)
HEMOGLOBIN: 10.4 g/dL — AB (ref 13.0–17.0)
MCH: 29.5 pg (ref 26.0–34.0)
MCHC: 32.4 g/dL (ref 30.0–36.0)
MCV: 90.9 fL (ref 78.0–100.0)
PLATELETS: 419 10*3/uL — AB (ref 150–400)
RBC: 3.53 MIL/uL — AB (ref 4.22–5.81)
RDW: 14.4 % (ref 11.5–15.5)
WBC: 11.6 10*3/uL — ABNORMAL HIGH (ref 4.0–10.5)

## 2017-03-29 LAB — PROTIME-INR
INR: 1.07
PROTHROMBIN TIME: 13.9 s (ref 11.4–15.2)

## 2017-03-29 LAB — APTT: aPTT: 28 seconds (ref 24–36)

## 2017-03-29 MED ORDER — ALLOPURINOL 300 MG PO TABS
300.0000 mg | ORAL_TABLET | Freq: Every day | ORAL | Status: DC
  Administered 2017-03-29 – 2017-04-11 (×14): 300 mg via ORAL
  Filled 2017-03-29: qty 3
  Filled 2017-03-29 (×13): qty 1

## 2017-03-29 MED ORDER — SODIUM CHLORIDE 0.9 % IV SOLN
300.0000 mg | INTRAVENOUS | Status: DC
  Filled 2017-03-29: qty 300

## 2017-03-29 MED ORDER — IOPAMIDOL (ISOVUE-300) INJECTION 61%
15.0000 mL | INTRAVENOUS | Status: AC
  Administered 2017-03-29 (×2): 15 mL via ORAL

## 2017-03-29 NOTE — Progress Notes (Signed)
Medicine attending: I examined this patient today together with student physician Ms. Eston Esters and resident physician Dr. Ina Homes and I concur with her evaluation and management plan which we discussed together. CT chest images reviewed with radiologist and interventional radiologist.  Small palpable soft tissue nodule in the left neck appears to be the tip of the iceberg.  There is a underlying 5 x 6 cm soft tissue mass in the left neck, likely lymph nodes.  Additional areas of abnormality in the left mediastinum, left paravertebral area, right retrocrural area, and atypical nodularity above the right kidney.  Small left pleural effusion. Calcium and creatinine trending down with hydration and Zometa.  Urine output borderline.  We will add as needed loop diuretic. He has developed an acute transaminitis.  Only medication he received in the hospital was Zometa. LDH is mildly elevated but this may reflect acute inflammation in the liver. Findings remain suspicious for malignancy, favor lymphoma. His uncle and healthcare spokesperson, brother, and sister-in-law are present.  Status and management plan discussed.  Uncle will take responsibility to sign for consent for procedure.  We did discuss with the patient the need for a biopsy.  He does have some cognitive deficits and it is not clear that he fully comprehends everything.  His uncle gives his verbal consent to proceed and will sign any documentation necessary. We will get a CT abdomen pelvis today and anticipate biopsy of the left neck soft tissue mass tomorrow.

## 2017-03-29 NOTE — Progress Notes (Addendum)
Bladder scan shows >421ml in bladder. MD paged.  Foley inserted as ordered.
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
## 2017-03-29 NOTE — Care Management Note (Signed)
Case Management Note  Patient Details  Name: KRISTIAN MOGG MRN: [id number] Date of Birth: 1951-06-21  Subjective/Objective:                 Patient admitted from home with uncle. Hx TBI. Presented with generalized weakness, incontinence, dysphagia, and weight loss of 1 month duration; imaging acquired yesterday is concerning for possible malignancy, will continue to work up with an abdominal CT today and US guided biopsy of the left neck mass tomorrow.   Action/Plan:  CM will follow for DC planning  Expected Discharge Date:                  Expected Discharge Plan:     In-House Referral:     Discharge planning Services  CM Consult  Post Acute Care Choice:    Choice offered to:     DME Arranged:    DME Agency:     HH Arranged:    HH Agency:     Status of Service:  In process, will continue to follow  If discussed at Long Length of Stay Meetings, dates discussed:    Additional Comments:  Carles Collet, RN 03/29/2017, 12:05 PM
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
## 2017-03-30 ENCOUNTER — Ambulatory Visit
Admit: 2017-03-30 | Discharge: 2017-03-30 | Disposition: A | Discharge status: Home / Self Care | Payer: Commercial Managed Care - HMO | Attending: Radiation Oncology | Admitting: Radiation Oncology

## 2017-03-30 ENCOUNTER — Inpatient Hospital Stay (HOSPITAL_COMMUNITY): Payer: Medicare HMO

## 2017-03-30 ENCOUNTER — Encounter (HOSPITAL_COMMUNITY): Payer: Self-pay

## 2017-03-30 DIAGNOSIS — R591 Generalized enlarged lymph nodes: Secondary | ICD-10-CM

## 2017-03-30 DIAGNOSIS — R221 Localized swelling, mass and lump, neck: Secondary | ICD-10-CM

## 2017-03-30 DIAGNOSIS — R599 Enlarged lymph nodes, unspecified: Secondary | ICD-10-CM

## 2017-03-30 LAB — COMPREHENSIVE METABOLIC PANEL
ALK PHOS: 137 U/L — AB (ref 38–126)
ALT: 126 U/L — AB (ref 17–63)
AST: 115 U/L — AB (ref 15–41)
Albumin: 2.1 g/dL — ABNORMAL LOW (ref 3.5–5.0)
Anion gap: 7 (ref 5–15)
BUN: 16 mg/dL (ref 6–20)
CALCIUM: 8.4 mg/dL — AB (ref 8.9–10.3)
CO2: 23 mmol/L (ref 22–32)
CREATININE: 1.48 mg/dL — AB (ref 0.61–1.24)
Chloride: 101 mmol/L (ref 101–111)
GFR, EST AFRICAN AMERICAN: 55 mL/min — AB (ref >60)
GFR, EST NON AFRICAN AMERICAN: 47 mL/min — AB (ref >60)
Glucose, Bld: 105 mg/dL — ABNORMAL HIGH (ref 65–99)
Potassium: 3.6 mmol/L (ref 3.5–5.1)
Sodium: 131 mmol/L — ABNORMAL LOW (ref 135–145)
Total Bilirubin: 3.2 mg/dL — ABNORMAL HIGH (ref 0.3–1.2)
Total Protein: 5.4 g/dL — ABNORMAL LOW (ref 6.5–8.1)

## 2017-03-30 LAB — CBC
HCT: 29.1 % — ABNORMAL LOW (ref 39.0–52.0)
Hemoglobin: 9.6 g/dL — ABNORMAL LOW (ref 13.0–17.0)
MCH: 29.4 pg (ref 26.0–34.0)
MCHC: 33 g/dL (ref 30.0–36.0)
MCV: 89.3 fL (ref 78.0–100.0)
PLATELETS: 392 10*3/uL (ref 150–400)
RBC: 3.26 MIL/uL — AB (ref 4.22–5.81)
RDW: 14.2 % (ref 11.5–15.5)
WBC: 13.5 10*3/uL — AB (ref 4.0–10.5)

## 2017-03-30 LAB — MULTIPLE MYELOMA PANEL, SERUM
ALBUMIN/GLOB SERPL: 0.8 (ref 0.7–1.7)
ALPHA 1: 0.4 g/dL (ref 0.0–0.4)
ALPHA2 GLOB SERPL ELPH-MCNC: 0.9 g/dL (ref 0.4–1.0)
Albumin SerPl Elph-Mcnc: 2.6 g/dL — ABNORMAL LOW (ref 2.9–4.4)
B-GLOBULIN SERPL ELPH-MCNC: 0.8 g/dL (ref 0.7–1.3)
GAMMA GLOB SERPL ELPH-MCNC: 1.5 g/dL (ref 0.4–1.8)
GLOBULIN, TOTAL: 3.6 g/dL (ref 2.2–3.9)
IGG (IMMUNOGLOBIN G), SERUM: 1404 mg/dL (ref 700–1600)
IGM (IMMUNOGLOBULIN M), SRM: 126 mg/dL (ref 20–172)
IgA: 238 mg/dL (ref 61–437)
M PROTEIN SERPL ELPH-MCNC: 0.5 g/dL — AB
Total Protein ELP: 6.2 g/dL (ref 6.0–8.5)

## 2017-03-30 LAB — HEPATITIS C ANTIBODY (REFLEX): HCV Ab: 0.1 s/co ratio (ref 0.0–0.9)

## 2017-03-30 LAB — URIC ACID: URIC ACID, SERUM: 4.9 mg/dL (ref 4.4–7.6)

## 2017-03-30 LAB — HCV COMMENT:

## 2017-03-30 LAB — BETA 2 MICROGLOBULIN, SERUM: Beta-2 Microglobulin: 4.2 mg/L — ABNORMAL HIGH (ref 0.6–2.4)

## 2017-03-30 LAB — HIV ANTIBODY (ROUTINE TESTING W REFLEX): HIV Screen 4th Generation wRfx: NONREACTIVE

## 2017-03-30 MED ORDER — FENTANYL CITRATE (PF) 100 MCG/2ML IJ SOLN
INTRAMUSCULAR | Status: AC
  Filled 2017-03-30: qty 2

## 2017-03-30 MED ORDER — MIDAZOLAM HCL 2 MG/2ML IJ SOLN
INTRAMUSCULAR | Status: AC
  Filled 2017-03-30: qty 2

## 2017-03-30 MED ORDER — PREDNISONE 50 MG PO TABS
60.0000 mg | ORAL_TABLET | Freq: Every day | ORAL | Status: AC
  Administered 2017-03-30 – 2017-04-03 (×5): 60 mg via ORAL
  Filled 2017-03-30 (×3): qty 1

## 2017-03-30 MED ORDER — FENTANYL CITRATE (PF) 100 MCG/2ML IJ SOLN
INTRAMUSCULAR | Status: AC | PRN
  Administered 2017-03-30 (×2): 50 ug via INTRAVENOUS

## 2017-03-30 MED ORDER — LIDOCAINE HCL (PF) 1 % IJ SOLN
INTRAMUSCULAR | Status: AC
  Filled 2017-03-30: qty 30

## 2017-03-30 MED ORDER — MIDAZOLAM HCL 2 MG/2ML IJ SOLN
INTRAMUSCULAR | Status: AC | PRN
  Administered 2017-03-30 (×2): 1 mg via INTRAVENOUS

## 2017-03-30 MED ORDER — PREDNISONE 20 MG PO TABS
40.0000 mg | ORAL_TABLET | Freq: Every day | ORAL | Status: AC
  Administered 2017-03-30 – 2017-04-03 (×5): 40 mg via ORAL
  Filled 2017-03-30 (×7): qty 2
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
## 2017-03-30 NOTE — Procedures (Signed)
Interventional Radiology Procedure Note  Procedure: [place] core biopsy left neck mass  Complications: None  Estimated Blood Loss: None  Recommendations: - Path pending   Signed,  Criselda Peaches, MD
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
## 2017-03-30 NOTE — Progress Notes (Signed)
MD aware of HR 125 and temp 99.4

## 2017-03-30 NOTE — Sedation Documentation (Signed)
Patient is resting comfortably. 

## 2017-03-30 NOTE — Sedation Documentation (Signed)
Bandaid L neck intact

## 2017-03-30 NOTE — Plan of Care (Signed)
Problem: Health Behavior/Discharge Planning: Goal: Ability to manage health-related needs will improve Outcome: Progressing Spoke with family about need for biopsy and evaluation of liver mass in order to make plan for future treatment.
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
## 2017-03-30 NOTE — Progress Notes (Signed)
Medicine attending: I examined this patient today together with resident physician Dr. Rochele Pages and student Dr. Eston Esters and I concur with their evaluation and management plan which we discussed together. Foley catheter placed for more accurate I's and O's.  Some degree of urinary retention on bladder scan. Allopurinol started.  Uric acid this morning in normal range at 4.9. Both calcium and renal function continue to improve. Despite CT findings of bulky retroperitoneal lymphadenopathy and left paraspinal soft tissue masses with erosive changes and adjacent T12 and L4, patient denies any back pain.  Motor strength of his lower extremities remains normal at 5/5.  Reflexes absent but symmetric unchanged from prior exams. Persistent transaminitis and now elevation of bilirubin and alkaline phosphatase.  No gross biliary obstruction on CT scan but study was noncontrast.  Suspect we are seeing some biliary obstruction from bulky intra-abdominal lymphoma. 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  We have asked radiation and medical oncology to consult.  I would also like to get opinion from urology with respect to prophylactic placement of a right ureteral stent to protect his kidney until he is started on definitive treatment.
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
## 2017-03-31 DIAGNOSIS — N1330 Unspecified hydronephrosis: Secondary | ICD-10-CM

## 2017-03-31 DIAGNOSIS — R590 Localized enlarged lymph nodes: Secondary | ICD-10-CM

## 2017-03-31 DIAGNOSIS — R911 Solitary pulmonary nodule: Secondary | ICD-10-CM

## 2017-03-31 DIAGNOSIS — J90 Pleural effusion, not elsewhere classified: Secondary | ICD-10-CM

## 2017-03-31 DIAGNOSIS — R740 Nonspecific elevation of levels of transaminase and lactic acid dehydrogenase [LDH]: Secondary | ICD-10-CM

## 2017-03-31 DIAGNOSIS — R531 Weakness: Secondary | ICD-10-CM

## 2017-03-31 DIAGNOSIS — R221 Localized swelling, mass and lump, neck: Secondary | ICD-10-CM

## 2017-03-31 LAB — COMPREHENSIVE METABOLIC PANEL
ALBUMIN: 2.2 g/dL — AB (ref 3.5–5.0)
ALT: 105 U/L — AB (ref 17–63)
AST: 69 U/L — AB (ref 15–41)
Alkaline Phosphatase: 153 U/L — ABNORMAL HIGH (ref 38–126)
Anion gap: 6 (ref 5–15)
BILIRUBIN TOTAL: 2 mg/dL — AB (ref 0.3–1.2)
BUN: 18 mg/dL (ref 6–20)
CHLORIDE: 103 mmol/L (ref 101–111)
CO2: 22 mmol/L (ref 22–32)
CREATININE: 1.38 mg/dL — AB (ref 0.61–1.24)
Calcium: 8.3 mg/dL — ABNORMAL LOW (ref 8.9–10.3)
GFR calc Af Amer: 60 mL/min — ABNORMAL LOW (ref >60)
GFR, EST NON AFRICAN AMERICAN: 51 mL/min — AB (ref >60)
GLUCOSE: 153 mg/dL — AB (ref 65–99)
Potassium: 3.9 mmol/L (ref 3.5–5.1)
Sodium: 131 mmol/L — ABNORMAL LOW (ref 135–145)
Total Protein: 5.7 g/dL — ABNORMAL LOW (ref 6.5–8.1)

## 2017-03-31 LAB — CBC
HCT: 32.8 % — ABNORMAL LOW (ref 39.0–52.0)
Hemoglobin: 10.7 g/dL — ABNORMAL LOW (ref 13.0–17.0)
MCH: 29 pg (ref 26.0–34.0)
MCHC: 32.6 g/dL (ref 30.0–36.0)
MCV: 88.9 fL (ref 78.0–100.0)
PLATELETS: 358 10*3/uL (ref 150–400)
RBC: 3.69 MIL/uL — ABNORMAL LOW (ref 4.22–5.81)
RDW: 14 % (ref 11.5–15.5)
WBC: 10.2 10*3/uL (ref 4.0–10.5)

## 2017-03-31 MED ORDER — HEPARIN SODIUM (PORCINE) 5000 UNIT/ML IJ SOLN
5000.0000 [IU] | Freq: Three times a day (TID) | INTRAMUSCULAR | Status: DC
  Administered 2017-03-31 – 2017-04-03 (×7): 5000 [IU] via SUBCUTANEOUS
  Administered 2017-04-04: 5 [IU] via SUBCUTANEOUS
  Administered 2017-04-05 – 2017-04-11 (×15): 5000 [IU] via SUBCUTANEOUS
  Filled 2017-03-31 (×27): qty 1

## 2017-03-31 MED ORDER — ENSURE ENLIVE PO LIQD
237.0000 mL | Freq: Three times a day (TID) | ORAL | Status: DC
  Administered 2017-04-01 – 2017-04-11 (×14): 237 mL via ORAL

## 2017-03-31 NOTE — Progress Notes (Signed)
Per conversation with the Nurse who had discussions with others a part of the care for the patient we are not going to proceed with assisting with an Advanced Directive and [place] at this point.
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
## 2017-03-31 NOTE — Plan of Care (Signed)
Family continues to remain overly attentive to patients care and even instructs him that he needs more blankets (even though he tells them he's not cold). When taking meds, patient was drinking ensure and family repeatedly insisted that he drink more so the pills would go down.  Patient informed them that his meds were gone, but family would not listen.  RN assessment found that patient responded appropriately to most questions and appears mainly oriented.  Family and live-in caregivers have requested information on advance directives to "put things in order." RN has concerns that patient may not be included in these decisions.
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
## 2017-03-31 NOTE — Progress Notes (Signed)
Physical Therapy Treatment Patient Details Name: Tyler [name] MRN: [id number] DOB: 19-Nov-1950 Today's Date: 03/31/2017    History of Present Illness 66 y.o. 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    PT Comments    Patient able to ambulate 46' with RW and mod assist for balance.  Patient fatigued quickly with standing x2 min twice to be cleaned following BM's.  Agree with need for SNF at d/c.   Follow Up Recommendations  SNF;Supervision/Assistance - 24 hour     Equipment Recommendations  Rolling walker with 5" wheels;3in1 (PT)    Recommendations for Other Services       Precautions / Restrictions Precautions Precautions: Fall Restrictions Weight Bearing Restrictions: No    Mobility  Bed Mobility Overal bed mobility: Needs Assistance Bed Mobility: Supine to Sit;Sit to Supine     Supine to sit: Min assist Sit to supine: Min assist   General bed mobility comments: Patient required repeated verbal cueing to move to sitting EOB.  Assist to raise trunk.  Required assist to bring LE's onto bed to return to supine.  Transfers Overall transfer level: Needs assistance Equipment used: Rolling walker (2 wheeled) Transfers: Sit to/from Stand Sit to Stand: Min assist;Mod assist         General transfer comment: Repeated cues for hand placement.  Min assist to rise from elevated bed.  Mod assist to move stand <> sit on toilet.  Ambulation/Gait Ambulation/Gait assistance: Mod assist Ambulation Distance (Feet): 24 Feet Assistive device: Rolling walker (2 wheeled) Gait Pattern/deviations: Step-through pattern;Decreased stride length;Shuffle;Trunk flexed Gait velocity: decreased Gait velocity interpretation: Below normal speed for age/gender General Gait Details: Flexed posture with RW too far ahead of him.  Cues for upright posture and to stay close to RW.  Patient stood at toilet and at bed for pericare following BM, 2 min each time.   Stairs            Wheelchair Mobility    Modified Rankin (Stroke Patients Only)       Balance             Standing balance-Leahy Scale: Poor Standing balance comment: Dependent on UE suport                            Cognition Arousal/Alertness: Awake/alert Behavior During Therapy: WFL for tasks assessed/performed Overall Cognitive Status: History of cognitive impairments - at baseline                                        Exercises      General Comments        Pertinent Vitals/Pain Pain Assessment: No/denies pain    Home Living                      Prior Function            PT Goals (current goals can now be found in the care plan section) Acute Rehab PT Goals Patient Stated Goal: did not state Progress towards PT goals: Not progressing toward goals - comment (Limited today by needing to be cleaned x2.)    Frequency    Min 2X/week      PT Plan Current plan remains appropriate  Co-evaluation              AM-PAC PT "6 Clicks" Daily Activity  Outcome Measure  Difficulty turning over in bed (including adjusting bedclothes, sheets and blankets)?: A Lot Difficulty moving from lying on back to sitting on the side of the bed? : Unable Difficulty sitting down on and standing up from a chair with arms (e.g., wheelchair, bedside commode, etc,.)?: Unable Help needed moving to and from a bed to chair (including a wheelchair)?: A Lot Help needed walking in hospital room?: A Lot Help needed climbing 3-5 steps with a railing? : Total 6 Click Score: 9    End of Session Equipment Utilized During Treatment: Gait belt Activity Tolerance: Patient tolerated treatment well;Patient limited by fatigue Patient left: in bed;with call bell/phone within reach;with bed alarm set;with family/visitor present Nurse Communication: Mobility  status (Large BM, cleaned) PT Visit Diagnosis: Unsteadiness on feet (R26.81);Other abnormalities of gait and mobility (R26.89);Muscle weakness (generalized) (M62.81)     Time: 8841-6606 PT Time Calculation (min) (ACUTE ONLY): 38 min  Charges:  $Gait Training: 23-37 mins $Therapeutic Activity: 8-22 mins                    G Codes:       Carita Pian. Sanjuana Kava, [facility] Acute Rehab Services Pager [place] 03/31/2017, 1:48 PM

## 2017-03-31 NOTE — Progress Notes (Signed)
Oncology consult received from Dr Beryle Beams. Patient will be seen today. Thanks.  [name] MD MS

## 2017-03-31 NOTE — Care Management Important Message (Signed)
Important Message  Patient Details  Name: Tyler [name] MRN: [id number] Date of Birth: [birth year]   Medicare Important Message Given:  Yes    Bharat Antillon Abena 03/31/2017, 9:03 AM

## 2017-03-31 NOTE — Progress Notes (Signed)
Medicine attending: I examined this patient today together with resident physician Dr. Ina Homes and student Dr. Ms. Eston Esters and I concur with her evaluation and management plan which we discussed together. We appreciate input from all consultants. He tolerated needle biopsy of the left neck mass yesterday. Renal and hepatic function are improving.  Calcium now in normal range.  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 Urology consultant does not feel that he needs ureteral stent placement at this time. Physical exam remains stable.  No complaints of back pain.  Motor strength of the upper and lower extremities 5/5.  Reflexes absent symmetric at the knees no change. 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 Unfortunately, needle biopsy showed only necrotic tissue. I have asked ear nose and throat surgery to consult to do an incisional biopsy of the left neck mass for adequate tissue.

## 2017-03-31 NOTE — Plan of Care (Signed)
Dr. Text to please update family in the room about current POC and any updates - per family request.

## 2017-03-31 NOTE — Consult Note (Signed)
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            [phone]    All of the patients questions were answered with apparent satisfaction. The patient knows to call the clinic with any problems, questions or concerns.  I spent 50 minutes counseling the patient face to face. The total time spent in the appointment was 80 minutes and more than 50% was on counseling and direct patient cares.    [name] MD Erda AAHIVMS [facility] Hematology/Oncology Physician [facility]  (Office):       [phone] (Work cell):  [phone] (Fax):           [phone]  03/31/2017 10:46 AM
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
## 2017-03-31 NOTE — Progress Notes (Signed)
   03/31/17 1100  Clinical Encounter Type  Visited With Patient and family together;Health care provider  Visit Type Initial  Referral From Nurse  Consult/Referral To Chaplain  Spiritual Encounters  Spiritual Needs Emotional;Literature  Stress Factors  Patient Stress Factors Family relationships  Family Stress Factors Family relationships   Per consult for AD I visited with patient and sister and uncle.  Family indicated they wanted him to fill out and AD.  In speaking with the family I let them know that the patient had to be able to understand and sign the name with the notary.  I spoke with the nurse and we agreed we would talk to the patient together to be sure the patient understood what was going on.  Provided support for the family and explained to the patient that I wanted to honor what he wants to do and that there would be follow up to be sure he had the right information.   Chaplain Katherene Ponto

## 2017-04-01 ENCOUNTER — Encounter (HOSPITAL_COMMUNITY): Payer: Self-pay | Admitting: Certified Registered"

## 2017-04-01 ENCOUNTER — Encounter (HOSPITAL_COMMUNITY)
Admission: EM | Disposition: A | Discharge status: Skilled Nursing Facility | Payer: Self-pay | Source: Home / Self Care | Attending: Oncology

## 2017-04-01 ENCOUNTER — Inpatient Hospital Stay (HOSPITAL_COMMUNITY): Payer: Medicare HMO | Admitting: Anesthesiology

## 2017-04-01 DIAGNOSIS — R740 Nonspecific elevation of levels of transaminase and lactic acid dehydrogenase [LDH]: Secondary | ICD-10-CM

## 2017-04-01 DIAGNOSIS — R7402 Elevation of levels of lactic acid dehydrogenase (LDH): Secondary | ICD-10-CM

## 2017-04-01 HISTORY — PX: MASS BIOPSY: SHX5445

## 2017-04-01 LAB — CULTURE, BLOOD (ROUTINE X 2)
Culture: NO GROWTH
Culture: NO GROWTH
SPECIAL REQUESTS: ADEQUATE
Special Requests: ADEQUATE

## 2017-04-01 LAB — COMPREHENSIVE METABOLIC PANEL
ALBUMIN: 2.1 g/dL — AB (ref 3.5–5.0)
ALT: 72 U/L — ABNORMAL HIGH (ref 17–63)
AST: 51 U/L — AB (ref 15–41)
Alkaline Phosphatase: 111 U/L (ref 38–126)
Anion gap: 6 (ref 5–15)
BUN: 23 mg/dL — AB (ref 6–20)
CHLORIDE: 104 mmol/L (ref 101–111)
CO2: 20 mmol/L — ABNORMAL LOW (ref 22–32)
Calcium: 7.9 mg/dL — ABNORMAL LOW (ref 8.9–10.3)
Creatinine, Ser: 1.02 mg/dL (ref 0.61–1.24)
GFR calc Af Amer: >60 mL/min (ref >60)
Glucose, Bld: 131 mg/dL — ABNORMAL HIGH (ref 65–99)
POTASSIUM: 4.5 mmol/L (ref 3.5–5.1)
SODIUM: 130 mmol/L — AB (ref 135–145)
Total Bilirubin: 1.5 mg/dL — ABNORMAL HIGH (ref 0.3–1.2)
Total Protein: 5 g/dL — ABNORMAL LOW (ref 6.5–8.1)

## 2017-04-01 SURGERY — BIOPSY, MASS, NECK
Anesthesia: General | Site: Neck | Laterality: Left

## 2017-04-01 MED ORDER — EPHEDRINE 5 MG/ML INJ
INTRAVENOUS | Status: AC
  Filled 2017-04-01: qty 10

## 2017-04-01 MED ORDER — LIDOCAINE 2% (20 MG/ML) 5 ML SYRINGE
INTRAMUSCULAR | Status: AC
  Filled 2017-04-01: qty 5

## 2017-04-01 MED ORDER — LACTATED RINGERS IV SOLN
INTRAVENOUS | Status: DC
Start: 2017-04-01 — End: 2017-04-11

## 2017-04-01 MED ORDER — ONDANSETRON HCL 4 MG/2ML IJ SOLN
INTRAMUSCULAR | Status: AC
  Filled 2017-04-01: qty 2

## 2017-04-01 MED ORDER — PROPOFOL 10 MG/ML IV BOLUS
INTRAVENOUS | Status: DC | PRN
  Administered 2017-04-01: 50 mg via INTRAVENOUS
  Administered 2017-04-01: 150 mg via INTRAVENOUS

## 2017-04-01 MED ORDER — ONDANSETRON HCL 4 MG/2ML IJ SOLN
INTRAMUSCULAR | Status: DC | PRN
  Administered 2017-04-01: 4 mg via INTRAVENOUS

## 2017-04-01 MED ORDER — FENTANYL CITRATE (PF) 100 MCG/2ML IJ SOLN
25.0000 ug | INTRAMUSCULAR | Status: DC | PRN
  Administered 2017-04-01: 50 ug via INTRAVENOUS

## 2017-04-01 MED ORDER — PHENYLEPHRINE 40 MCG/ML (10ML) SYRINGE FOR IV PUSH (FOR BLOOD PRESSURE SUPPORT)
PREFILLED_SYRINGE | INTRAVENOUS | Status: DC | PRN
  Administered 2017-04-01: 40 ug via INTRAVENOUS
  Administered 2017-04-01 (×3): 80 ug via INTRAVENOUS
  Administered 2017-04-01: 120 ug via INTRAVENOUS

## 2017-04-01 MED ORDER — 0.9 % SODIUM CHLORIDE (POUR BTL) OPTIME
TOPICAL | Status: DC | PRN
  Administered 2017-04-01: 1000 mL

## 2017-04-01 MED ORDER — PROPOFOL 10 MG/ML IV BOLUS
INTRAVENOUS | Status: AC
  Filled 2017-04-01: qty 20

## 2017-04-01 MED ORDER — FENTANYL CITRATE (PF) 250 MCG/5ML IJ SOLN
INTRAMUSCULAR | Status: DC | PRN
  Administered 2017-04-01 (×2): 50 ug via INTRAVENOUS

## 2017-04-01 MED ORDER — ONDANSETRON HCL 4 MG/2ML IJ SOLN: 4.0000 mg | Freq: Once | INTRAMUSCULAR | Status: DC | PRN

## 2017-04-01 MED ORDER — DEXAMETHASONE SODIUM PHOSPHATE 10 MG/ML IJ SOLN
INTRAMUSCULAR | Status: DC | PRN
  Administered 2017-04-01: 10 mg via INTRAVENOUS

## 2017-04-01 MED ORDER — EPHEDRINE SULFATE-NACL 50-0.9 MG/10ML-% IV SOSY
PREFILLED_SYRINGE | INTRAVENOUS | Status: DC | PRN
  Administered 2017-04-01: 10 mg via INTRAVENOUS

## 2017-04-01 MED ORDER — PHENYLEPHRINE 40 MCG/ML (10ML) SYRINGE FOR IV PUSH (FOR BLOOD PRESSURE SUPPORT)
PREFILLED_SYRINGE | INTRAVENOUS | Status: AC
  Filled 2017-04-01: qty 10

## 2017-04-01 MED ORDER — MIDAZOLAM HCL 2 MG/2ML IJ SOLN
INTRAMUSCULAR | Status: AC
  Filled 2017-04-01: qty 2

## 2017-04-01 MED ORDER — LIDOCAINE-EPINEPHRINE 1 %-1:100000 IJ SOLN
INTRAMUSCULAR | Status: AC
  Filled 2017-04-01: qty 1

## 2017-04-01 MED ORDER — FENTANYL CITRATE (PF) 250 MCG/5ML IJ SOLN
INTRAMUSCULAR | Status: AC
  Filled 2017-04-01: qty 5

## 2017-04-01 MED ORDER — CEFAZOLIN SODIUM-DEXTROSE 2-4 GM/100ML-% IV SOLN
INTRAVENOUS | Status: AC
  Filled 2017-04-01: qty 100

## 2017-04-01 MED ORDER — DEXAMETHASONE SODIUM PHOSPHATE 10 MG/ML IJ SOLN
INTRAMUSCULAR | Status: AC
  Filled 2017-04-01: qty 1

## 2017-04-01 MED ORDER — LIDOCAINE-EPINEPHRINE 1 %-1:100000 IJ SOLN
INTRAMUSCULAR | Status: DC | PRN
  Administered 2017-04-01: 2 mL

## 2017-04-01 MED ORDER — LIDOCAINE 2% (20 MG/ML) 5 ML SYRINGE
INTRAMUSCULAR | Status: DC | PRN
  Administered 2017-04-01: 60 mg via INTRAVENOUS

## 2017-04-01 MED ORDER — FENTANYL CITRATE (PF) 100 MCG/2ML IJ SOLN
INTRAMUSCULAR | Status: AC
  Filled 2017-04-01: qty 2

## 2017-04-01 MED ORDER — CEFAZOLIN SODIUM-DEXTROSE 2-4 GM/100ML-% IV SOLN
2.0000 g | Freq: Once | INTRAVENOUS | Status: AC
  Administered 2017-04-01: 2 g via INTRAVENOUS

## 2017-04-01 SURGICAL SUPPLY — 41 items
BLADE SURG 15 STRL LF DISP TIS (BLADE) ×1 IMPLANT
BLADE SURG 15 STRL SS (BLADE) ×2
BNDG GAUZE ELAST 4 BULKY (GAUZE/BANDAGES/DRESSINGS) ×3 IMPLANT
CANISTER SUCT 3000ML PPV (MISCELLANEOUS) ×3 IMPLANT
CLEANER TIP ELECTROSURG 2X2 (MISCELLANEOUS) IMPLANT
CONT SPEC 4OZ CLIKSEAL STRL BL (MISCELLANEOUS) ×3 IMPLANT
COVER SURGICAL LIGHT HANDLE (MISCELLANEOUS) ×3 IMPLANT
CRADLE DONUT ADULT HEAD (MISCELLANEOUS) ×3 IMPLANT
DRAIN PENROSE 1/4X12 LTX STRL (WOUND CARE) ×3 IMPLANT
DRAPE HALF SHEET 40X57 (DRAPES) IMPLANT
DRSG EMULSION OIL 3X3 NADH (GAUZE/BANDAGES/DRESSINGS) IMPLANT
ELECT COATED BLADE 2.86 ST (ELECTRODE) ×3 IMPLANT
ELECT NEEDLE TIP 2.8 STRL (NEEDLE) IMPLANT
ELECT REM PT RETURN 9FT ADLT (ELECTROSURGICAL) ×3
ELECTRODE REM PT RTRN 9FT ADLT (ELECTROSURGICAL) ×1 IMPLANT
GAUZE SPONGE 4X4 12PLY STRL (GAUZE/BANDAGES/DRESSINGS) IMPLANT
GLOVE BIO SURGEON STRL SZ7.5 (GLOVE) ×3 IMPLANT
GOWN STRL REUS W/ TWL LRG LVL3 (GOWN DISPOSABLE) ×2 IMPLANT
GOWN STRL REUS W/TWL LRG LVL3 (GOWN DISPOSABLE) ×4
KIT BASIN OR (CUSTOM PROCEDURE TRAY) ×3 IMPLANT
KIT ROOM TURNOVER OR (KITS) ×3 IMPLANT
NEEDLE HYPO 25GX1X1/2 BEV (NEEDLE) ×3 IMPLANT
NS IRRIG 1000ML POUR BTL (IV SOLUTION) ×3 IMPLANT
PAD ARMBOARD 7.5X6 YLW CONV (MISCELLANEOUS) ×6 IMPLANT
PENCIL BUTTON HOLSTER BLD 10FT (ELECTRODE) ×3 IMPLANT
SUT CHROMIC 4 0 P 3 18 (SUTURE) IMPLANT
SUT ETHILON 2 0 FS 18 (SUTURE) ×3 IMPLANT
SUT ETHILON 4 0 PS 2 18 (SUTURE) IMPLANT
SUT ETHILON 5 0 P 3 18 (SUTURE) ×2
SUT NYLON ETHILON 5-0 P-3 1X18 (SUTURE) ×1 IMPLANT
SUT SILK 3 0 (SUTURE) ×2
SUT SILK 3-0 18XBRD TIE 12 (SUTURE) ×1 IMPLANT
SUT SILK 4 0 (SUTURE)
SUT SILK 4-0 18XBRD TIE 12 (SUTURE) IMPLANT
SUT VIC AB 4-0 PS2 18 (SUTURE) ×3 IMPLANT
SWAB COLLECTION DEVICE MRSA (MISCELLANEOUS) IMPLANT
SWAB CULTURE ESWAB REG 1ML (MISCELLANEOUS) IMPLANT
SYR BULB IRRIGATION 50ML (SYRINGE) ×3 IMPLANT
SYR CONTROL 10ML LL (SYRINGE) ×3 IMPLANT
SYR TB 1ML LUER SLIP (SYRINGE) IMPLANT
TRAY ENT MC OR (CUSTOM PROCEDURE TRAY) ×3 IMPLANT

## 2017-04-01 NOTE — Progress Notes (Signed)
Medicine attending: We greatly appreciate prompt intervention by ear nose and throat surgery.  Patient taken for an open biopsy of a left neck mass this morning.  Stable pre-and post op. Renal function and calcium now normal following hydration, bisphosphonates, and steroids.  Liver functions continue to improve. 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 We will get a baseline echocardiogram in anticipation of need for anthracycline containing chemotherapy. He will need bone marrow biopsy and a PET scan to complete staging. He will need central vascular access for chemotherapy. Continue hydration and allopurinol.  Day 3 of 5 prednisone.

## 2017-04-01 NOTE — Progress Notes (Signed)
   Subjective: Heading to the OR this AM. Resting comfortably in bed. Discussed that he will go for a biopsy and then we will await results. Called family to inform them that he is heading to the OR. All questions and concerns answered.   Objective: Vital signs in last 24 hours: Vitals:   03/31/17 1000 03/31/17 1829 03/31/17 2034 04/01/17 0447  BP: 135/79 132/77 (!) 146/84 123/71  Pulse: (!) 110 99 (!) 115 95  Resp: '18  20 20  '$ Temp: 98 F (36.7 C) 98.2 F (36.8 C) 98.1 F (36.7 C) 98.4 F (36.9 C)  TempSrc: Oral Oral Oral Oral  SpO2: 99% 98% 96% 95%  Weight:   165 lb 12.6 oz (75.2 kg)   Height:       Physical Exam  Constitutional: No distress.  Musculoskeletal: Normal range of motion.  Neurological: He is alert.  Skin: He is not diaphoretic.   Assessment/Plan: Principal Problem:   Hypercalcemia Active Problems:   Generalized weakness   History of traumatic brain injury (1976)   Acute Kidney Injury (AKI)   Normocytic anemia   Pressure injury of skin   Mass of left side of neck   Lymphadenopathy of head and neck   Retroperitoneal lymphadenopathy   Solitary pulmonary nodule   Pleural effusion on left   Hydronephrosis of right kidney  1. Hypercalcemia 2/2 suspected multifocal high-grade non-Hodgkin's lymphoma - Improving s/p 4 mg Zoledronic acid, corrected Ca 9.7 with albumin of 2.2 - Multiple Myeloma work-up negative   - CT chest illustrating mass in the left neck measuring 5.5 x 6.1 cm, a soft tissue density within or adjacent to the central left lobe measuring 2.8 x 1.9 cm (positioned medial to the left lower lobe segmental bronchi), and 5 mm left upper lobe pulmonary nodule. - CT abdomen showing retroperitoneal adenopathy, large paraspinal soft tissue masses at T12 and L4 with adjacent lytic osseous changes, and extensive nodularity throughout the right perinephric fat. - Radiation oncology consulted: anticipate the utility of radiotherapy once biopsy results come back.   - Seen by urology: Recommending no inpatient procedure, but would like to do outpatient cystoscopy, right ureteroscopy, and bilateral retrograde pyelogram - Consulted Dr. Irene Limbo with medi-onc, appreciate recommendation  - Continue Prednisone 100 mg QD - Will likely need to be transferred to [facility] for further treatment   - S/p ultrasound guided biopsy of the Left neck mass, biopsy showing diffuse necrosis, inadequate sample  - Consulted ENT for open neck biopsy, heading to OR for biopsy this AM - Family informed of the treatment/work-up plan   2. Generalized Weakness 2/2 Hypercalcemia  - Associated symptoms include decreased PO intake due to dysphagia, urinary retention, and weight loss - Nutrition consult recommend adding Ensure Enlive TID - OT/PT consult recommending SNF on discharge   3. AKI. Resolved - Baseline creatinine unknown, Cr 2.01 on admission  - Trend: 2.01 ->2.09 -> 1.68 -> 1.48 -> 1.38 -> 1.02  4. Transaminitis  - AST, ALT and Alk phos trending down  - Hepatitis C and HIV negative   Dispo: Anticipated discharge in approximately >1 day(s).   Ina Homes, MD 04/01/2017, 6:12 AM My Pager: [phone]

## 2017-04-01 NOTE — Anesthesia Procedure Notes (Signed)
Procedure Name: LMA Insertion Date/Time: 04/01/2017 9:27 AM Performed by: Sampson Si E Pre-anesthesia Checklist: Patient identified, Emergency Drugs available, Suction available and Patient being monitored Patient Re-evaluated:Patient Re-evaluated prior to induction Oxygen Delivery Method: Circle System Utilized Preoxygenation: Pre-oxygenation with 100% oxygen Induction Type: IV induction Ventilation: Mask ventilation without difficulty LMA: LMA inserted LMA Size: 4.0 Number of attempts: 1 Placement Confirmation: positive ETCO2 Tube secured with: Tape Dental Injury: Teeth and Oropharynx as per pre-operative assessment

## 2017-04-01 NOTE — Brief Op Note (Signed)
03/27/2017 - 04/01/2017  10:12 AM  PATIENT:  Tyler [name]  66 y.o. male  PRE-OPERATIVE DIAGNOSIS:  Neck Mass   POST-OPERATIVE DIAGNOSIS:  neck Mass  PROCEDURE:  Procedure(s): OPEN BIOPSY LEFT NECK MASS (Left)  SURGEON:  Surgeon(s) and Role:    Melida Quitter, MD - Primary  PHYSICIAN ASSISTANT:   ASSISTANTS: none   ANESTHESIA:   general  EBL:  Total I/O In: 1000 [I.V.:1000] Out: 420 [Urine:400; Blood:20]  BLOOD ADMINISTERED:none  DRAINS: Penrose drain in the left neck   LOCAL MEDICATIONS USED:  LIDOCAINE   SPECIMEN:  Source of Specimen:  left neck mass for lymphoma protocol  DISPOSITION OF SPECIMEN:  PATHOLOGY  COUNTS:  YES  TOURNIQUET:  * No tourniquets in log *  DICTATION: .Other Dictation: Dictation Number [id number]  PLAN OF CARE: Return to hospital room  PATIENT DISPOSITION:  PACU - hemodynamically stable.   Delay start of Pharmacological VTE agent (>24hrs) due to surgical blood loss or risk of bleeding: yes

## 2017-04-01 NOTE — Plan of Care (Signed)
Problem: Safety: Goal: Ability to remain free from injury will improve Outcome: Progressing Patient resting in bed and does not attempt to get out of bed.

## 2017-04-01 NOTE — Anesthesia Postprocedure Evaluation (Signed)
Anesthesia Post Note  Patient: PASCUAL MANTEL  Procedure(s) Performed: Procedure(s) (LRB): OPEN BIOPSY LEFT NECK MASS (Left)     Patient location during evaluation: PACU Anesthesia Type: General Level of consciousness: awake and alert Pain management: pain level controlled Vital Signs Assessment: post-procedure vital signs reviewed and stable Respiratory status: spontaneous breathing, nonlabored ventilation, respiratory function stable and patient connected to nasal cannula oxygen Cardiovascular status: blood pressure returned to baseline and stable Postop Assessment: no signs of nausea or vomiting Anesthetic complications: no    Last Vitals:  Vitals:   04/01/17 1216 04/01/17 1229  BP: 124/69 127/70  Pulse: 87 91  Resp: 16 16  Temp:    SpO2: 97% 98%    Last Pain:  Vitals:   04/01/17 1127  TempSrc: Oral  PainSc:                  Wallis Spizzirri P Akito Boomhower

## 2017-04-01 NOTE — Transfer of Care (Signed)
Immediate Anesthesia Transfer of Care Note  Patient: Tyler [name]  Procedure(s) Performed: Procedure(s): OPEN BIOPSY LEFT NECK MASS (Left)  Patient Location: PACU  Anesthesia Type:General  Level of Consciousness: awake and alert   Airway & Oxygen Therapy: Patient Spontanous Breathing  Post-op Assessment: Report given to RN  Post vital signs: Reviewed and stable  Last Vitals:  Vitals:   03/31/17 2034 04/01/17 0447  BP: (!) 146/84 123/71  Pulse: (!) 115 95  Resp: 20 20  Temp: 36.7 C 36.9 C  SpO2: 96% 95%    Last Pain:  Vitals:   04/01/17 0447  TempSrc: Oral  PainSc:          Complications: No apparent anesthesia complications

## 2017-04-01 NOTE — Anesthesia Preprocedure Evaluation (Addendum)
Anesthesia Evaluation  Patient identified by MRN, date of birth, ID band Patient awake    Reviewed: Allergy & Precautions, NPO status , Patient's Chart, lab work & pertinent test results  Airway Mallampati: II  TM Distance: >3 FB Neck ROM: Full    Dental  (+) Teeth Intact, Chipped, Dental Advisory Given,    Pulmonary neg pulmonary ROS,    Pulmonary exam normal breath sounds clear to auscultation       Cardiovascular negative cardio ROS Normal cardiovascular exam Rhythm:Regular Rate:Normal  ECG: ST, rate 114   Neuro/Psych Traumatic Brain Injury negative psych ROS   GI/Hepatic negative GI ROS, Neg liver ROS,   Endo/Other  negative endocrine ROS  Renal/GU AKI     Musculoskeletal Neck Mass  weakness   Abdominal   Peds  Hematology  (+) anemia , Possible lymphoma    Anesthesia Other Findings Hypercalcemia   Reproductive/Obstetrics                          Anesthesia Physical Anesthesia Plan  ASA: III  Anesthesia Plan: General   Post-op Pain Management:    Induction: Intravenous  PONV Risk Score and Plan: 2 and Ondansetron and Dexamethasone  Airway Management Planned: LMA  Additional Equipment:   Intra-op Plan:   Post-operative Plan: Extubation in OR  Informed Consent: I have reviewed the patients History and Physical, chart, labs and discussed the procedure including the risks, benefits and alternatives for the proposed anesthesia with the patient or authorized representative who has indicated his/her understanding and acceptance.   Dental advisory given  Plan Discussed with: CRNA  Anesthesia Plan Comments:       Anesthesia Quick Evaluation

## 2017-04-02 ENCOUNTER — Encounter (HOSPITAL_COMMUNITY): Payer: Self-pay | Admitting: Otolaryngology
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
## 2017-04-02 NOTE — Progress Notes (Signed)
   Subjective:    Patient ID: MARCELLE [name], male    DOB: Jun 01, 1951, 66 y.o.   MRN: [id number]  HPI Doing well.  No specific complaints.  Review of Systems     Objective:   Physical Exam AF VSS Alert, NAD Left neck incision clean and intact with Penrose drain in place, little drainage, fluff dressing in place.    Assessment & Plan:  Left neck mass s/p incisional biopsy  Plan to leave Penrose drain in place until drainage decreases or treatment gets underway.  Pathology pending.

## 2017-04-02 NOTE — Progress Notes (Addendum)
   Subjective:  Feels well this morning, wants coffee. Per staff, he was up all night and IV came out. Mittens were placed. IV replaced this morning. He denies pain at his surgical site. Denies fevers, chills, chest pain, or SOB.  Objective:  Vital signs in last 24 hours: Vitals:   04/01/17 1335 04/01/17 1715 04/01/17 2056 04/02/17 0501  BP: 121/74 (!) 150/81 133/72 125/68  Pulse: (!) 114 (!) 105 (!) 103 100  Resp: '16 18 19 20  '$ Temp:  98 F (36.7 C) 98 F (36.7 C) 98.5 F (36.9 C)  TempSrc:  Oral Oral Oral  SpO2: 95% 96% 94% 95%  Weight:   165 lb 9.1 oz (75.1 kg)   Height:       General: resting in bed, no acute distress Cardiac: RRR, no rubs, murmurs or gallops Pulm: clear to auscultation bilaterally, moving normal volumes of air Abd: soft, nontender, nondistended, BS present Ext: warm and well perfused, no pedal edema Neuro: alert and oriented X3, strength 5/5 lower extremities, sensation intact   Assessment/Plan:  Principal Problem:   Hypercalcemia Active Problems:   Generalized weakness   History of traumatic brain injury (1976)   Acute Kidney Injury (AKI)   Normocytic anemia   Pressure injury of skin   Neck mass   Adenopathy   Retroperitoneal lymphadenopathy   Solitary pulmonary nodule   Pleural effusion on left   Hydronephrosis of right kidney   Elevated LDH  Hypercalcemia 2/2 suspectedmultifocal high-grade non-Hodgkin's lymphoma - Improving s/p 4 mg Zoledronic acid, corrected Ca 9.4 with albumin of 2.1 - Multiple Myeloma work-up negative  - CT chest illustrating mass in the left neck measuring 5.5 x 6.1 cm, a soft tissue density within or adjacent to the central left lobe measuring 2.8 x 1.9 cm (positioned medial to the left lower lobe segmental bronchi), and 5 mm left upper lobe pulmonary nodule. - CT abdomen showing retroperitoneal adenopathy, large paraspinal soft tissue masses at T12 and L4 with adjacent lytic osseous changes, and extensive nodularity  throughout the right perinephric fat. - Radiation oncology consulted: anticipate the utility of radiotherapy once biopsy results come back.  - Seen by urology: Recommending no inpatient procedure, but would like to do outpatient cystoscopy, right ureteroscopy, and bilateral retrograde pyelogram - Consulted Dr. Irene Limbo with med-onc, appreciate recommendations - Continue Prednisone 100 mg QD; Day 4 of 5 - Continue Allopurinol - May need to be transferred to [facility] for further treatment  - S/p ultrasound guided biopsy of the Left neck mass, biopsy showing diffuse necrosis, inadequate sample  - Consulted ENT for open neck biopsy performed 9/8; pathology pending - Will obtain Echocardiogram for anticipated use of anthracycline - Family informed of the treatment/work-up plan   Generalized Weakness 2/2 Hypercalcemia  - Associated symptoms include decreased PO intake due to dysphagia, urinary retention, and weight loss - Nutrition consult recommend adding Ensure Enlive TID - OT/PT consult recommending SNF on discharge   AKI. Resolved - Baseline creatinine unknown, Cr 2.01 on admission  - Trend: 2.01 ->2.09 ->1.68 -> 1.48 -> 1.38 -> 1.02  Transaminitis  - AST, ALT and Alk phos trending down  - Hepatitis C and HIV negative   Dispo: Anticipated discharge pending clinical course.   Zada Finders, MD 04/02/2017, 8:41 AM

## 2017-04-02 NOTE — Progress Notes (Signed)
Medicine attending: Clinical status and outstanding data reviewed with resident physician Dr. Zada Finders and I concur with his evaluation and management plan which we discussed together. He tolerated open biopsy of the left neck mass done yesterday.  Renal function and calcium remained stable and in normal range through most recent September 8 values. 5 day course of prednisone 100 mg daily in divided doses in progress pending biopsy confirmation of suspected lymphoma.  Hopefully we will get some preliminary results late tomorrow. Echocardiogram ordered results pending.  He will need a Port-A-Cath for chemotherapy but we will wait until we confirm lymphoma diagnosis before proceeding with this. Medical and radiation oncology on board.  Anticipate transfer to [facility] long hospital medical oncology service for definitive treatment once biopsy results available.

## 2017-04-03 ENCOUNTER — Inpatient Hospital Stay (HOSPITAL_COMMUNITY): Payer: Medicare HMO

## 2017-04-03 DIAGNOSIS — I313 Pericardial effusion (noninflammatory): Secondary | ICD-10-CM

## 2017-04-03 LAB — COMPREHENSIVE METABOLIC PANEL
ALBUMIN: 2 g/dL — AB (ref 3.5–5.0)
ALT: 63 U/L (ref 17–63)
AST: 35 U/L (ref 15–41)
Alkaline Phosphatase: 89 U/L (ref 38–126)
Anion gap: 3 — ABNORMAL LOW (ref 5–15)
BILIRUBIN TOTAL: 0.6 mg/dL (ref 0.3–1.2)
BUN: 22 mg/dL — AB (ref 6–20)
CO2: 24 mmol/L (ref 22–32)
Calcium: 7.5 mg/dL — ABNORMAL LOW (ref 8.9–10.3)
Chloride: 106 mmol/L (ref 101–111)
Creatinine, Ser: 1.03 mg/dL (ref 0.61–1.24)
GFR calc Af Amer: >60 mL/min (ref >60)
GFR calc non Af Amer: >60 mL/min (ref >60)
GLUCOSE: 133 mg/dL — AB (ref 65–99)
POTASSIUM: 3.8 mmol/L (ref 3.5–5.1)
SODIUM: 133 mmol/L — AB (ref 135–145)
TOTAL PROTEIN: 4.8 g/dL — AB (ref 6.5–8.1)

## 2017-04-03 LAB — CBC
HCT: 27.7 % — ABNORMAL LOW (ref 39.0–52.0)
Hemoglobin: 9 g/dL — ABNORMAL LOW (ref 13.0–17.0)
MCH: 29 pg (ref 26.0–34.0)
MCHC: 32.5 g/dL (ref 30.0–36.0)
MCV: 89.4 fL (ref 78.0–100.0)
PLATELETS: 387 10*3/uL (ref 150–400)
RBC: 3.1 MIL/uL — ABNORMAL LOW (ref 4.22–5.81)
RDW: 15 % (ref 11.5–15.5)
WBC: 12.2 10*3/uL — AB (ref 4.0–10.5)

## 2017-04-03 LAB — ECHOCARDIOGRAM COMPLETE
HEIGHTINCHES: 72 in
WEIGHTICAEL: 2652.57 [oz_av]

## 2017-04-03 LAB — LACTATE DEHYDROGENASE: LDH: 171 U/L (ref 98–192)

## 2017-04-03 LAB — URIC ACID: Uric Acid, Serum: 2.2 mg/dL — ABNORMAL LOW (ref 4.4–7.6)

## 2017-04-03 MED ORDER — LIDOCAINE HCL (PF) 2 % IJ SOLN
0.0000 mL | Freq: Once | INTRAMUSCULAR | Status: DC | PRN
  Filled 2017-04-03: qty 20

## 2017-04-03 NOTE — Progress Notes (Signed)
   Subjective:    Patient ID: Tyler [name], male    DOB: 1950-12-15, 66 y.o.   MRN: [id number]  HPI Doing well with no complaints.  Review of Systems     Objective:   Physical Exam Alert, NAD Left neck incision clean and intact, Penrose drain in place, clear fluid expressible.    Assessment & Plan:  Left neck mass s/p open biopsy  Pathology pending.  Still some drainage from Penrose, will leave in place for now.

## 2017-04-03 NOTE — Progress Notes (Signed)
Occupational Therapy Treatment Patient Details Name: Tyler [name] MRN: [id number] DOB: 12-29-1950 Today's Date: 04/03/2017    History of present illness 66 y.o. 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   OT comments  Pt participated in ADL retraining session today with focus on bed mobility, functional transfers for ADL's, grooming & feeding. Pt cont to require frequent vc's for safety with all functional mobility and for adherence to task at hand. Pt sitting up in chair with chair alarm, eating breakfast at conclusion of treatment session today. Cont to recommend SNF Rehab w/ 24/7 Supervision/Assist.   Follow Up Recommendations  SNF;Supervision/Assistance - 24 hour    Equipment Recommendations  Other (comment) (Defer to next venue)    Recommendations for Other Services      Precautions / Restrictions Precautions Precautions: Fall Restrictions Weight Bearing Restrictions: No       Mobility Bed Mobility Overal bed mobility: Needs Assistance Bed Mobility: Supine to Sit;Sit to Supine     Supine to sit: Min assist     General bed mobility comments: Patient required repeated verbal cueing to move to sitting EOB.  Assist to raise trunk.   Transfers Overall transfer level: Needs assistance Equipment used: Rolling walker (2 wheeled) Transfers: Sit to/from [facility] Sit to Stand: Min assist Stand pivot transfers: Mod assist (Mod A for safety and sequencing)       General transfer comment: Repeated cues for hand placement.  Min assist to rise from elevated bed.  Mod assist for SPT using RW and transferring to recliner chair after ambulating around bed    Balance Overall balance assessment: Needs assistance Sitting-balance support: Feet supported Sitting balance-Leahy Scale: Good       Standing balance-Leahy Scale: Poor Standing  balance comment: Dependent on UE suport                           ADL either performed or assessed with clinical judgement   ADL Overall ADL's : Needs assistance/impaired Eating/Feeding: Set up;Sitting   Grooming: Wash/dry hands;Wash/dry face;Set up;Supervision/safety;Sitting Grooming Details (indicate cue type and reason): Sitting EOB for grooming. Pt declined oral care stating that he already brushed his teeth this morning         Upper Body Dressing : Minimal assistance;Sitting       Toilet Transfer: Minimal assistance;Ambulation;RW;BSC Toilet Transfer Details (indicate cue type and reason): Pt with catheter but performed simulated transfer from EOB to recliner - ambulated using RW, Min A and vc's for safety/sequencing (around bed and to recliner this date). Mod Vc's required for safety noted. Toileting- Clothing Manipulation and Hygiene: Minimal assistance;Sit to/from stand       Functional mobility during ADLs: Minimal assistance;Rolling walker General ADL Comments: Pt seen for ADL retraining session today with focus on grooming, toilet transfers and orientation to date/time and situation. Pt with catheter and perserverating on need to "take a pee", pt was educated that he could at any time and catheter bag/catheter was shown to pt. This was repeated at least 5 times. Attempted unrinating in sitting and standing multiple times as pt was confused.     Vision Baseline Vision/History: Wears glasses (Pt requested glasses this date - searched room and no glasses were found at this time. ? if they may be at home ) Patient Visual Report: No change from baseline  Perception     Praxis      Cognition Arousal/Alertness: Awake/alert Behavior During Therapy: WFL for tasks assessed/performed Overall Cognitive Status: History of cognitive impairments - at baseline                                 General Comments: Some confusion during OT session. Pt  requires frequesnt redirection to task and consistent/moderate vc's for safety/sequencing with tasks performed.        Exercises     Shoulder Instructions       General Comments      Pertinent Vitals/ Pain       Pain Assessment: No/denies pain  Home Living                                          Prior Functioning/Environment              Frequency           Progress Toward Goals  OT Goals(current goals can now be found in the care plan section)  Progress towards OT goals: Progressing toward goals  Acute Rehab OT Goals Patient Stated Goal: did not state  Plan Discharge plan remains appropriate    Co-evaluation                 AM-PAC PT "6 Clicks" Daily Activity     Outcome Measure   Help from another person eating meals?: None Help from another person taking care of personal grooming?: A Little Help from another person toileting, which includes using toliet, bedpan, or urinal?: A Little Help from another person bathing (including washing, rinsing, drying)?: A Little Help from another person to put on and taking off regular upper body clothing?: A Little Help from another person to put on and taking off regular lower body clothing?: A Little 6 Click Score: 19    End of Session Equipment Utilized During Treatment: Gait belt;Rolling walker  OT Visit Diagnosis: Unsteadiness on feet (R26.81);Other abnormalities of gait and mobility (R26.89);Muscle weakness (generalized) (M62.81);Adult, failure to thrive (R62.7);Other symptoms and signs involving cognitive function   Activity Tolerance Patient tolerated treatment well   Patient Left in chair;with call bell/phone within reach;with chair alarm set;with family/visitor present   Nurse Communication          Time: [phone] OT Time Calculation (min): 27 min  Charges: OT General Charges $OT Visit: 1 Visit OT Treatments $Self Care/Home Management : 23-37 mins   Almyra Deforest, OTR/L 04/03/2017, 8:33 AM

## 2017-04-03 NOTE — Clinical Social Work Note (Signed)
CSW talked with patient's sister, Waylan Boga ([phone]) regarding facility choice and family wants [facility]. Call made to [facility], admissions director regarding accepting patient and she has to consult with billing to determine if they are in network with [facility], but won't be able to do so until Tuesday morning. Insurance authorization will be initiated Tuesday morning and CSW will get response from [place] with [employer] regarding patient's insurance.  Rocko Fesperman Givens, MSW, LCSW Licensed Clinical Social Worker [place] [phone]

## 2017-04-03 NOTE — Discharge Summary (Signed)
Name: Tyler [name] MRN: [id number] DOB: [birth year] 66 y.o. PCP: Andreas Blower, MD  Date of Admission: 03/27/2017 11:03 AM Date of Discharge: 04/04/2017 Attending Physician: Annia Belt, MD  TRANSFER NOTE  Transfer Diagnosis: 1. 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   Why:  For an appointment in 1-2 weeks when you get home. Contact information: [place] [address] [phone]          Hospital Course by problem list: Principal Problem:   Hypercalcemia Active Problems:   Generalized weakness   History of traumatic brain injury (1976)   Acute Kidney Injury (AKI)   Normocytic anemia   Pressure injury of skin   Neck mass   Adenopathy   Retroperitoneal lymphadenopathy   Solitary pulmonary nodule   Pleural effusion on left   Hydronephrosis of right kidney   Elevated LDH   Large cell lymphoma (HCC)   1. Multifocal High-grade Non-Hodgkin's Lymphoma. Tyler [name] is a 66 y.o. Male with a PMHx significant for TBI >40 years prior presenting for progressive weakness, weight loss, and altered mental status for 1 month duration. In the ED, he was found to have hypercalcemia, acute kidney injury, and a normocytic anemia. Evaluation of of hypercalcemia was intitiated, endocrine causes including hyperparathyroidism and thyroid dysfunction were excluded. The patient denies taking any calcium supplementation or medication that may cause hypercalcemia. Subsequently an  abdominal x-ray illustrated a mass in the left lower lung. CT thorax and abdomen showed a left neck mass measuring 6-7cm, adenopathy of the thoracic cavity, and bulky adenopathy of the abdomen with compression of the right ureter. LFTs began to trend up and high dose steroids were started. Urology was consulted for possible palliative right ureter stent placement and recommended no inpatient intervention but further evaluation as an outpatient including outpatient cystoscopy, right ureteroscopy, and bilateral retrograde pyelogram. IR was consulted for a ultrasound guide biopsy of the left neck mass; however, unfortunately the biopsy showed diffuse necrosis and a diagnosis could not be made. ENT was then consulted and subsequently took Mr. Hurlbut for a left neck open biopsy. Biopsy pathology indicated high grade lymphoma. Radiation oncology was notified of the patient with anticipated utility of radiotherapy pending biopsy results. Medical oncology was also consulted and recommended continuing high dose prednisone for 5 days, tumor lysis syndrome prophylaxis with allopurinol, outpatient PET scan, and echocardiogram for baseline cardiac function prior to the initiation of chemotherapy.  Based on the biopsy results, the diagnosis of high grade lymphoma was made and the patient was transferred to [facility] for further evaluation and treatment.   At time of transfer the Tyler [name] calcium was 9.1, creatinine 0.86, LFT were slightly elevated, and hemoglobin was 9.0. Pertinent imaging including thoracic and abdominal CT, and TTE can be seen below.  Discharge Vitals:   BP (!) 150/83 (BP Location: Right Arm)   Pulse (!) 109   Temp 98 F (36.7 C) (Oral)   Resp 18   Ht  6' (1.829 m)   Wt 166 lb 0.1 oz (75.3 kg)   SpO2 96%   BMI 22.51 kg/m   Pertinent Labs, Studies, and Procedures:  CT Chest w/out Contrast  IMPRESSION: 1. Mildly motion degraded exam. 2. Left infrahilar soft tissue fullness is felt to  either represent localized adenopathy or a central left lower lobe lung nodule. Consider sampling by bronchoscopy. 3. Small left pleural effusion. Mild dependent airspace disease is primarily felt to represent atelectasis. Mild concurrent infection or aspiration cannot be excluded. 4. Left neck soft tissue mass is incompletely imaged and of indeterminate etiology. Recommend physical exam correlation and consideration of dedicated neck CT (ideally with contrast). 5. Right suprarenal nodularity with suggestion of retrocrural nodularity versus borderline adenopathy. Consider dedicated abdominal imaging to exclude etiologies such as a lymphoproliferative process. 6. Equivocal soft tissue fullness about the left paravertebral region. This would also be better evaluated with dedicated abdominal imaging. 7. A left upper lobe pulmonary nodule warrants followup attention.  CT Abdomen Pelvis w/out Contrast  IMPRESSION: 1. Bulky retroperitoneal adenopathy. Large left paraspinal soft tissue masses at T12 and L4 with adjacent lytic osseous changes. Extensive nodularity throughout the right perinephric fat. Indeterminate left adrenal nodule. 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 Metastatic disease from an unknown primary is not excluded. 2. Moderate right hydroureteronephrosis, apparently due to a small soft tissue mass in the right lumbar ureter. 3. Re- demonstration of 2.8 cm left infrahilar pulmonary nodule and small left and trace right dependent pleural effusions, which are slightly increased. 4. Moderate rectal stool, cannot exclude rectal fecal impaction. No evidence of bowel obstruction.  Transthoracic Echocardiogram  Study Conclusions - Left ventricle: The cavity size was normal. Wall thickness was   normal. Systolic function was normal. The estimated ejection   fraction was in the range of 50% to 55%. - Right ventricle: The cavity size was mildly dilated. - Right atrium: The atrium was mildly  dilated. - Pericardium, extracardiac: Small pericardial effusion along   inferior and lateral surface of RV Does not appear to be   hemdynamically significant.  Signed: Ina Homes, MD 04/04/2017, 4:41 PM   My Pager: [phone]

## 2017-04-03 NOTE — Progress Notes (Signed)
Oncology Short Note  Called and discussed with Dr Monica Martinez --- pathology results will not be available till tomorrow ECHO done -result pending Would get PET/CT as outpatient but would not delay treatment for this. Based on final diagnosis given patients concerning clinical presentation - might need to pursue 1st cycle of treatment as inpatient - decision pending pathology results. Will f/u with pathology results and patient tomorrow.  [name] MD MS

## 2017-04-03 NOTE — Progress Notes (Signed)
Physical Therapy Treatment Patient Details Name: Tyler [name] MRN: [id number] DOB: Apr 13, 1951 Today's Date: 04/03/2017    History of Present Illness 66 y.o. 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    PT Comments    Continuing work on functional mobility and activity tolerance;  Able to walk further today -- though fatigued with longer distances and had a significant loss of balance, needing Max assist to prevent fall; Overall progressing well; Anticipate continuing good progress at post-acute rehabilitation.    Follow Up Recommendations  SNF;Supervision/Assistance - 24 hour     Equipment Recommendations  Rolling walker with 5" wheels;3in1 (PT)    Recommendations for Other Services       Precautions / Restrictions Precautions Precautions: Fall    Mobility  Bed Mobility Overal bed mobility: Needs Assistance Bed Mobility: Supine to Sit     Supine to sit: Min assist     General bed mobility comments: Patient required repeated verbal cueing to move to sitting EOB.  Assist to raise trunk.   Transfers Overall transfer level: Needs assistance Equipment used: Rolling walker (2 wheeled) Transfers: Sit to/from Stand Sit to Stand: Min assist         General transfer comment: Repeated cues for hand placement.  Min assist to rise from elevated bed.  Cues for hand placement and control of descent to chair  Ambulation/Gait Ambulation/Gait assistance: Mod assist Ambulation Distance (Feet):  (Hallway ambulation) Assistive device: Rolling walker (2 wheeled) Gait Pattern/deviations: Step-through pattern;Decreased stride length;Shuffle;Trunk flexed     General Gait Details: Flexed posture with RW too far ahead of him.  Cues for upright posture and to stay close to RW. Noting fatigue with incr distance with one major loss of balance requiring max assist to  prevent fall   Stairs            Wheelchair Mobility    Modified Rankin (Stroke Patients Only)       Balance     Sitting balance-Leahy Scale: Good       Standing balance-Leahy Scale: Poor Standing balance comment: Dependent on UE suport                            Cognition Arousal/Alertness: Awake/alert Behavior During Therapy: WFL for tasks assessed/performed Overall Cognitive Status: History of cognitive impairments - at baseline                                 General Comments: Pt requires frequesnt redirection to task and consistent/moderate vc's for safety/sequencing with tasks performed.      Exercises      General Comments        Pertinent Vitals/Pain Pain Assessment: No/denies pain    Home Living                      Prior Function            PT Goals (current goals can now be found in the care plan section) Acute Rehab PT Goals Patient Stated Goal: did not state PT Goal Formulation: Patient unable to participate in goal setting Time For Goal Achievement: 04/11/17 Potential to Achieve Goals: Good Progress towards PT goals: Progressing toward goals    Frequency    Min 2X/week  PT Plan Current plan remains appropriate    Co-evaluation              AM-PAC PT "6 Clicks" Daily Activity  Outcome Measure  Difficulty turning over in bed (including adjusting bedclothes, sheets and blankets)?: A Little Difficulty moving from lying on back to sitting on the side of the bed? : A Lot Difficulty sitting down on and standing up from a chair with arms (e.g., wheelchair, bedside commode, etc,.)?: Unable Help needed moving to and from a bed to chair (including a wheelchair)?: A Lot Help needed walking in hospital room?: A Lot Help needed climbing 3-5 steps with a railing? : A Lot 6 Click Score: 12    End of Session Equipment Utilized During Treatment: Gait belt Activity Tolerance: Patient tolerated  treatment well;Patient limited by fatigue Patient left: in chair;with call bell/phone within reach;with chair alarm set Nurse Communication: Mobility status PT Visit Diagnosis: Unsteadiness on feet (R26.81);Other abnormalities of gait and mobility (R26.89);Muscle weakness (generalized) (M62.81)     Time: 8144-8185 PT Time Calculation (min) (ACUTE ONLY): 29 min  Charges:  $Gait Training: 23-37 mins                    G Codes:       Roney Marion, PT  Acute Rehabilitation Services Pager [phone] Office [place] 04/03/2017, 4:22 PM

## 2017-04-03 NOTE — Progress Notes (Signed)
  Echocardiogram 2D Echocardiogram has been performed.  Tyler [name] 04/03/2017, 12:36 PM

## 2017-04-03 NOTE — Progress Notes (Signed)
Medicine attending: I examined this patient today together with resident physician Dr. Ina Homes and I concur with his evaluation and management plan which we discussed together.   Renal function has normalized.  Calcium remains in normal range.  Echocardiogram done results pending. Small Penrose drain left neck status post excision of lymph node.  Patient in good spirits. LDH and liver functions now in normal range except for decreased albumin. Presumed diagnosis high-grade non-Hodgkin's lymphoma.  Final path should be out later today or tomorrow. I personally do not see the necessity for a PET scan since we have gross disease demonstrated on CT. He will need a bone marrow biopsy to complete staging. I believe to the discretion of the consulting oncologist whether or not he would like to transfer the patient to [facility] long for his first treatment or defer treatment to the outpatient setting.

## 2017-04-03 NOTE — Progress Notes (Signed)
I called the patient's room to see if his uncle was at the bedside as the patient's aunt stated that Mateo Flow was here in [place] seeing Mr. Colden. We are still waiting on pathology. We will follow up with Dr. Irene Limbo as well and attempt to contact family tomorrow.

## 2017-04-03 NOTE — Progress Notes (Signed)
The patient has undergone open biopsy over the weekend of his left neck mass with hopes of confirming a suspected diagnosis of lymphoma. We will await results and proceed with planning for radiotherapy in the near future. I will attempt to contact his uncle again today.     Carola Rhine, PAC

## 2017-04-03 NOTE — Progress Notes (Signed)
   Subjective: Doing well this AM. Sitting in his chair. No discomfort from his neck. Denies difficulty swallowing, swelling, or trouble breathing. Discussed the plan to await biopsy results before any further treatment and get an Echocardiogram of his heart in anticipation of chemotherapy. He agree with the treatment plan and has no questions or concerns. Will call to update his family as well.   Objective: Vital signs in last 24 hours: Vitals:   04/02/17 1000 04/02/17 1800 04/02/17 2008 04/03/17 0414  BP: 120/72 128/75 (!) 135/58 132/63  Pulse: 92 (!) 102 100 98  Resp: '20 20 19 20  '$ Temp: 98 F (36.7 C) 98.2 F (36.8 C) 98.4 F (36.9 C) 98.8 F (37.1 C)  TempSrc: Oral Oral Oral Oral  SpO2: 96% 98% 99% 100%  Weight:   165 lb 12.6 oz (75.2 kg)   Height:       Physical Exam  Constitutional: He appears well-developed and well-nourished.  Neck:  Drainage tube in place. Dressing dry with no apparent discharge.  Cardiovascular: Normal rate, regular rhythm, normal heart sounds and intact distal pulses.   Pulmonary/Chest: Effort normal and breath sounds normal.  Abdominal: Soft. Bowel sounds are normal.  Musculoskeletal: Normal range of motion. He exhibits no edema.  Neurological: He is alert.   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 - CT abdomen showing retroperitoneal adenopathy, large paraspinal soft tissue masses at T12 and L4 with adjacent lytic osseous changes, and extensive nodularity throughout the right perinephric fat. - Radiation oncology consulted: anticipate the utility of radiotherapy once biopsy results come back.  - Seen by Urology: Recommending no inpatient procedure, but would like to do outpatient cystoscopy, right ureteroscopy, and bilateral retrograde pyelogram - Consulted Dr. Irene Limbo with med-onc, agree with plan to get echocardiogram, suggests outpatient PET scan, and continued allopurinol therapy - ENT, Dr. Redmond Baseman will manage drain. Appreciate the help and the quick biopsy.  - LDH trend: 261 -> 171 - ContinuePrednisone 100 mg QD; Day 5 of 5 - Continue Allopurinol - Will obtain Echocardiogram for anticipated use of anthracycline - May need to be transferred to [facility] for further treatment  - Family informed of the treatment/work-up plan   Generalized Weakness 2/2 Hypercalcemia  - Associated symptoms include decreased PO intake due to dysphagia, urinary retention, and weight loss - Nutrition consult recommend adding Ensure Enlive TID - OT/PT consult recommending SNF on discharge   Transaminitis  - AST, ALT and Alk phos trending down  - Hepatitis C and HIV negative   AKI. Resolved  Dispo: Anticipated discharge in approximately >1 day(s).   Ina Homes, MD 04/03/2017, 6:37 AM My Pager: [phone]

## 2017-04-03 NOTE — Progress Notes (Signed)
Patient was in his chair sitting up and gazing in the air. Chaplain introduced herself with greetings. Chaplain started th Advance directives topic but the patient seemed very unaware of what was happening. Chaplain then shared with an attending medical staff who confirmed that the patient was unfit for signing any document. Chaplain Jaxn Chiquito provided compassionate presence and later left.   Chaplain Steffie Waggoner.

## 2017-04-03 NOTE — Op Note (Signed)
NAME:  Tyler [name], Tyler [name]                     ACCOUNT NO.:  MEDICAL RECORD NO.:  [id number]  LOCATION:                                 FACILITY:  PHYSICIAN:  Onnie Graham, MD     DATE OF BIRTH:  1951/05/31  DATE OF PROCEDURE:  04/01/2017 DATE OF DISCHARGE:                              OPERATIVE REPORT   PREOPERATIVE DIAGNOSIS:  Left neck mass.  POSTOPERATIVE DIAGNOSIS:  Left neck mass.  PROCEDURE:  Open biopsy, left neck mass.  SURGEON:  Leane Para. Redmond Baseman, MD.  ANESTHESIA:  General LMA.  COMPLICATIONS:  None.  INDICATION:  The patient is a 66 year old male, who was admitted a few days ago with hypercalcemia, anorexia, dysphagia, and ataxia.  Workup has demonstrated bulky adenopathy in the retroperitoneal space invading the vertebrae along with a left neck mass and left lung mass.  Needle biopsy of the left neck mass was inconclusive, so an open biopsy was requested.  Lymphoma is the presumptive diagnosis.  FINDINGS:  There was a mass immediately under the skin in the left lateral neck that had a pale fleshy appearance.  A chunk of the mass was removed directly under the skin.  The mass superficial extent of the mass extended from the larger deeper neck mass that measured about 6 cm on CT imaging.  Some of the tissue between the superficial mass and the deeper mass was also removed and sent for pathology with the superficial mass.  The necrotic center of the mass was encountered where nectar thick clear fluid drained.  Tissue was sent for lymphoma protocol.  DESCRIPTION OF PROCEDURE:  The patient was identified in the holding room and informed consent having been obtained including discussion of risks, benefits, alternatives, the patient was brought to the operative suite and put on the operating table in supine position.  Anesthesia was induced, and the patient was intubated with an LMA without difficulty. The patient was given intravenous antibiotics during the case.  The  eyes taped closed and the left neck incision was marked with a marking pen and injected with 1% lidocaine with 1:100,000 epinephrine.  The left neck was prepped and draped in sterile fashion.  Incision was made with a 15 blade scalpel and the skin was then elevated off the mass using the 15 blade scalpel.  Bovie electrocautery was then used to further dissect around the mass and the superficial extent of the mass was then removed using the electrocautery.  Some additional tissue was then removed from the deeper portion that encompassed the larger mass once again using Bovie electrocautery.  A clearish fluid was then began to drain from the mass and the suction tip was used to probe the mass and was found to enter into the center of the mass where there was an open space. Bleeding was controlled using ligature and Bovie electrocautery.  The tissue that was collected was sent for lymphoma protocol.  A 0.25-inch Penrose drain was placed in the depth of the wound and secured to the skin using a 2-0 nylon suture.  The subcutaneous layer was closed with 4- 0 Vicryl suture in a simple interrupted fashion.  The skin was closed with 5-0 nylon in a simple interrupted fashion.  Drapes were removed.  The patient was cleaned off. A Kerlix fluff dressing was placed around the neck.  He was returned to Anesthesia for wake-up, was extubated, and moved to the recovery room in stable condition.     Onnie Graham, MD     DDB/MEDQ  D:  04/01/2017  T:  04/01/2017  Job:  [id number]

## 2017-04-04 DIAGNOSIS — C8588 Other specified types of non-Hodgkin lymphoma, lymph nodes of multiple sites: Secondary | ICD-10-CM

## 2017-04-04 DIAGNOSIS — C8580 Other specified types of non-Hodgkin lymphoma, unspecified site: Secondary | ICD-10-CM

## 2017-04-04 LAB — SAVE SMEAR

## 2017-04-04 LAB — CBC WITH DIFFERENTIAL/PLATELET
BASOS ABS: 0 10*3/uL (ref 0.0–0.1)
BASOS PCT: 0 %
EOS ABS: 0 10*3/uL (ref 0.0–0.7)
EOS PCT: 0 %
HCT: 31.4 % — ABNORMAL LOW (ref 39.0–52.0)
Hemoglobin: 10.4 g/dL — ABNORMAL LOW (ref 13.0–17.0)
Lymphocytes Relative: 6 %
Lymphs Abs: 1.1 10*3/uL (ref 0.7–4.0)
MCH: 29.3 pg (ref 26.0–34.0)
MCHC: 33.1 g/dL (ref 30.0–36.0)
MCV: 88.5 fL (ref 78.0–100.0)
Monocytes Absolute: 1.2 10*3/uL — ABNORMAL HIGH (ref 0.1–1.0)
Monocytes Relative: 7 %
Neutro Abs: 14.2 10*3/uL — ABNORMAL HIGH (ref 1.7–7.7)
Neutrophils Relative %: 87 %
PLATELETS: 478 10*3/uL — AB (ref 150–400)
RBC: 3.55 MIL/uL — AB (ref 4.22–5.81)
RDW: 14.9 % (ref 11.5–15.5)
WBC: 16.5 10*3/uL — AB (ref 4.0–10.5)

## 2017-04-04 LAB — COMPREHENSIVE METABOLIC PANEL
ALT: 83 U/L — AB (ref 17–63)
AST: 44 U/L — AB (ref 15–41)
Albumin: 2.1 g/dL — ABNORMAL LOW (ref 3.5–5.0)
Alkaline Phosphatase: 88 U/L (ref 38–126)
Anion gap: 4 — ABNORMAL LOW (ref 5–15)
BILIRUBIN TOTAL: 0.6 mg/dL (ref 0.3–1.2)
BUN: 19 mg/dL (ref 6–20)
CO2: 25 mmol/L (ref 22–32)
CREATININE: 0.86 mg/dL (ref 0.61–1.24)
Calcium: 7.5 mg/dL — ABNORMAL LOW (ref 8.9–10.3)
Chloride: 103 mmol/L (ref 101–111)
Glucose, Bld: 116 mg/dL — ABNORMAL HIGH (ref 65–99)
Potassium: 3.7 mmol/L (ref 3.5–5.1)
Sodium: 132 mmol/L — ABNORMAL LOW (ref 135–145)
TOTAL PROTEIN: 4.6 g/dL — AB (ref 6.5–8.1)

## 2017-04-04 MED ORDER — LORAZEPAM 2 MG/ML IJ SOLN
1.0000 mg | Freq: Once | INTRAMUSCULAR | Status: AC
  Administered 2017-04-04: 1 mg via INTRAVENOUS
  Filled 2017-04-04: qty 1

## 2017-04-04 NOTE — Progress Notes (Signed)
Physical Therapy Treatment Patient Details Name: Tyler [name] MRN: [id number] DOB: 08-Dec-1950 Today's Date: 04/04/2017    History of Present Illness 66 y.o. 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    PT Comments    Continuing work on functional mobility and activity tolerance;  Able to walk with RW and min assist/minguard assist; Needing lots of encouragement to participate this afternoon, but ultimately willing; no gross loss of balance with amb today; assisted pt with cleaning up after incontinent of urine  Follow Up Recommendations  SNF;Supervision/Assistance - 24 hour     Equipment Recommendations  Rolling walker with 5" wheels;3in1 (PT)    Recommendations for Other Services       Precautions / Restrictions Precautions Precautions: Fall    Mobility  Bed Mobility                  Transfers Overall transfer level: Needs assistance Equipment used: Rolling walker (2 wheeled) Transfers: Sit to/from Stand Sit to Stand: Min guard         General transfer comment: Repeated cues for hand placement; Not needing assist to rise, but doing so impulsively; close guard for safety  Ambulation/Gait Ambulation/Gait assistance: Min assist Ambulation Distance (Feet):  (Hallway amb) Assistive device: Rolling walker (2 wheeled) Gait Pattern/deviations: Step-through pattern;Decreased stride length;Shuffle;Trunk flexed Gait velocity: decreased   General Gait Details: Flexed posture with RW too far ahead of him.  Cues for upright posture and to stay close to RW.    Stairs            Wheelchair Mobility    Modified Rankin (Stroke Patients Only)       Balance     Sitting balance-Leahy Scale: Good       Standing balance-Leahy Scale: Poor Standing balance comment: Dependent on UE suport                            Cognition  Arousal/Alertness: Awake/alert Behavior During Therapy: WFL for tasks assessed/performed Overall Cognitive Status: History of cognitive impairments - at baseline                                 General Comments: Pt requires frequesnt redirection to task and consistent/moderate vc's for safety/sequencing with tasks performed.      Exercises      General Comments General comments (skin integrity, edema, etc.): Pt was impulsively standing to get to the bathroom with residents when I arrived; I assisted with gettign to the bathroom and cleaning up pt prior to hallway ambulation      Pertinent Vitals/Pain Pain Assessment: No/denies pain    Home Living                      Prior Function            PT Goals (current goals can now be found in the care plan section) Acute Rehab PT Goals Patient Stated Goal: did not state PT Goal Formulation: Patient unable to participate in goal setting Time For Goal Achievement: 04/11/17 Potential to Achieve Goals: Good Progress towards PT goals: Progressing toward goals    Frequency    Min 2X/week      PT Plan Current plan remains appropriate    Co-evaluation  AM-PAC PT "6 Clicks" Daily Activity  Outcome Measure  Difficulty turning over in bed (including adjusting bedclothes, sheets and blankets)?: A Little Difficulty moving from lying on back to sitting on the side of the bed? : A Lot Difficulty sitting down on and standing up from a chair with arms (e.g., wheelchair, bedside commode, etc,.)?: Unable Help needed moving to and from a bed to chair (including a wheelchair)?: A Lot Help needed walking in hospital room?: A Lot Help needed climbing 3-5 steps with a railing? : A Lot 6 Click Score: 12    End of Session Equipment Utilized During Treatment: Gait belt Activity Tolerance: Patient tolerated treatment well;Patient limited by fatigue Patient left: in chair;with call bell/phone within  reach Nurse Communication: Mobility status PT Visit Diagnosis: Unsteadiness on feet (R26.81);Other abnormalities of gait and mobility (R26.89);Muscle weakness (generalized) (M62.81)     Time: 0964-3838 PT Time Calculation (min) (ACUTE ONLY): 23 min  Charges:  $Gait Training: 8-22 mins $Therapeutic Activity: 8-22 mins                    G Codes:       Roney Marion, PT  Acute Rehabilitation Services Pager [phone] Office [place] 04/04/2017, 4:55 PM

## 2017-04-04 NOTE — Care Management Important Message (Signed)
Important Message  Patient Details  Name: Tyler [name] MRN: [id number] Date of Birth: 05/25/1951   Medicare Important Message Given:  Yes    Nathen May 04/04/2017, 8:52 AM

## 2017-04-04 NOTE — Progress Notes (Signed)
Subjective: Tyler [name] is a 66 y.o. male with a PMH of TBI >40 years ago with presumed high grade lymphoma s/p lymph node biopsy on 04/01/17.  He is doing well, sitting up drinking coffee when I visited him.   Objective: BP 131/73   Pulse 88   Temp 98.4 F (36.9 C)   Resp 17   Ht 6' (1.829 m)   Wt 166 lb 0.1 oz (75.3 kg)   SpO2 95%   BMI 22.51 kg/m      Intake/Output Summary (Last 24 hours) at 04/04/17 0823 Last data filed at 04/04/17 0601  Gross per 24 hour  Intake             2185 ml  Output             1500 ml  Net              685 ml   General Appearance:  Alert, cachetic  HEENT: Penrose drain in place with bandage around the neck - not discharge or drainage noted. No erythema surrounding bandage.  Lungs:   Normal work of breathing on room air - clear to auscultation bilaterally  Heart:  Regular rate and rhythm, S1 and S2 normal  Abdomen:   Soft, non-tender, bowel sounds active all four quadrants,    no masses, no organomegaly   Lab Results:   Hepatic Function Panel     Component Value Date/Time   PROT 4.6 (L) 04/04/2017 0552   ALBUMIN 2.1 (L) 04/04/2017 0552   AST 44 (H) 04/04/2017 0552   ALT 83 (H) 04/04/2017 0552   ALKPHOS 88 04/04/2017 0552   BILITOT 0.6 04/04/2017 0552   BILIDIR 1.1 (H) 03/29/2017 0419   IBILI 0.6 03/29/2017 0419    Recent Labs Lab 03/29/17 0419 03/30/17 0405 03/31/17 0438 04/01/17 0632 04/03/17 0236 04/04/17 0552  BUN 19 16 18 23* 22* 19  CREATININE 1.63* 1.48* 1.38* 1.02 1.03 0.86  NA 135 131* 131* 130* 133* 132*  K 3.8 3.6 3.9 4.5 3.8 3.7  CO2 27 23 22 20* 24 25  CL 102 101 103 104 106 103  AST 141* 115* 69* 51* 35 44*  ALT 111* 126* 105* 72* 63 83*  PROT 5.6* 5.4* 5.7* 5.0* 4.8* 4.6*    Recent Labs Lab 03/29/17 0419 03/30/17 0405 03/31/17 0438 04/03/17 0236  WBC 11.6* 13.5* 10.2 12.2*  HGB 10.4* 9.6* 10.7* 9.0*  HCT 32.1* 29.1* 32.8* 27.7*  PLT 419* 392 358 387  MCV 90.9 89.3 88.9 89.4  MCH 29.5 29.4 29.0  29.0  MCHC 32.4 33.0 32.6 32.5  RDW 14.4 14.2 14.0 15.0    BMET    Component Value Date/Time   NA 132 (L) 04/04/2017 0552   K 3.7 04/04/2017 0552   CL 103 04/04/2017 0552   CO2 25 04/04/2017 0552   GLUCOSE 116 (H) 04/04/2017 0552   BUN 19 04/04/2017 0552   CREATININE 0.86 04/04/2017 0552   CALCIUM 7.5 (L) 04/04/2017 0552   CALCIUM 11.2 (H) 03/27/2017 1438   GFRNONAA >60 04/04/2017 0552   GFRAA >60 04/04/2017 0552   Lab Results  Component Value Date   CREATININE 0.86 04/04/2017   CREATININE 1.03 04/03/2017   CREATININE 1.02 04/01/2017   Echocardiogram, 04/03/17: ejection fraction of 50-55% with normal systolic function, slightly dilated right ventricle, mildly dilated right atrium, a small pericardial effusion that does not appear to be hemodynamically significant  Assessment: Tyler [name] is a 66 y.o. male with   a PMH of TBI >40 years ago who presented on 03/27/17 with hypercalcemia & has been found to have presumed lymphoma; awaiting biopsy results, pending transfer to cancer treatment center  Plan: 1. Hypercalcemia: 9.1 corrected calcium with albumin of 2.1  - Followed by Dr. Kale (Medical-Oncology): recommends outpatient PET scan but not to delay treatment. Will determine need pending biopsy. Results should return today.  - Continues to be followed by radiation oncology   - Bone marrow biopsy this AM  - Continue allopurinol - pharmacy does not currently recommend any dose adjustment based on renal function - Continue normal saline to reverse dehydration & establish brisk urine output, net +8317.5mL since admit - Continue allopurinol  2.  Weakness, dysphagia  - continue IVF & workup, likely due to cause of hypercalcemia  - Nutrition recommended Ensure TID  3. AKI Resolved  4. Transaminitis LFTs up this morning, will continue to monitor  - HepC/HIV negative   5. DVT prophylaxis  -resume heparin, d/c SCDs  6. Cerumen impaction: CT: nearly occlusive cerumen  involving both external auditory canals.   - patient not currently concerned, deferred exam   Dispo: anticipated d/c with biopsy results, either SNF or [facility]  This is a Medical Student Note.  The care of the patient was discussed with Dr. Helberg and the assessment and plan was formulated with their assistance.  Please see their note for official documentation of the patient encounter.  Signed [place] , MS3 04/04/2017 8:23 AM 

## 2017-04-04 NOTE — Progress Notes (Signed)
   Subjective: Doing well this AM. Discussed the plan for a bone marrow biopsy and the associated risks. He is agreeable with the plan. Discussed with the family yesterday as well. No questions or concerns this AM. Denies trouble urinating, eating, SOA, chest pain.   Objective: Vital signs in last 24 hours: Vitals:   04/03/17 1801 04/03/17 2051 04/04/17 0436 04/04/17 0500  BP: (!) 163/92 124/69 131/73   Pulse: 100 97 88   Resp: _0 Temp: 98.3 F (36.8 C) 98.5 F (36.9 C) 98.4 F (36.9 C)   TempSrc: Oral     SpO2: 97% 93% 95%   Weight:    166 lb 0.1 oz (75.3 kg)  Height:       Physical Exam  Neck:  Penrose drain in place. Bandage around neck. No discharge or drainage noted.  Cardiovascular: Normal rate, regular rhythm, normal heart sounds and intact distal pulses.   Pulmonary/Chest: Effort normal and breath sounds normal. No respiratory distress. He has no wheezes.  Abdominal: Soft. Bowel sounds are normal. He exhibits no distension. There is no tenderness.  Musculoskeletal: Normal range of motion. He exhibits edema.  Neurological: He is alert.   Assessment/Plan: Principal Problem:   Hypercalcemia Active Problems:   Generalized weakness   History of traumatic brain injury (1976)   Acute Kidney Injury (AKI)   Normocytic anemia   Pressure injury of skin   Neck mass   Adenopathy   Retroperitoneal lymphadenopathy   Solitary pulmonary nodule   Pleural effusion on left   Hydronephrosis of right kidney   Elevated LDH  Hypercalcemia 2/2 suspectedmultifocal high-grade non-Hodgkin's lymphoma - Improving s/p 4 mg Zoledronic acid, corrected Ca 9.1with albumin of 2.1 - Continue to be followed by Dr. Irene Limbo (Medical-oncology), recommending outpatient PET scan but not to delay treatment. Will determine need for inpatient treatment pending biopsy - Radiation oncology continues to follow, still anticipating the utility of radiotherapy.  - Follow-up with urology in place for  outpatient evaluation  - Dr. Redmond Baseman (ENT), continues to follow s/p left open neck biopsy with penrose drain placement  - S/p Prednisone 100 mg x 5 days  - Continue Allopurinol, discussed the need for renal adjustment with pharmacy. Not recommending any dose adjustment based on renal function  - Echo illustrating an EF 50-55% with normal systolic function, slightly dilated right ventricle, mildly dilated right atrium, and a small pericardial effusion that does not appear to be hemodynamically significant. - Biopsy results should be available today, at which point we will decide whether Mr. Greenough should be transferred to [facility] or discharged to a SNF - Bone marrow biopsy this AM - Family informed of the treatment/work-up plan   Generalized Weakness 2/2 Hypercalcemia  - Associated symptoms include decreased PO intake due to dysphagia, urinary retention, and weight loss - Nutrition consult recommend adding Ensure Enlive TID - OT/PT consult recommending SNF on discharge, social work working on placement     Transaminitis. - LFTs slightly increased this AM - Hep C/HIV negative AKI. Resolved  Dispo: Anticipated discharge in approximately 1-2 day(s).   Ina Homes, MD 04/04/2017, 6:16 AM My Pager: [phone]

## 2017-04-04 NOTE — Progress Notes (Signed)
Noted pt's diagnosis of lymphoma following open neck biopsy. We will plan to simulate tomorrow provided I can get in contact with the patient's uncle for consent. With anticipating XRT to begin Thursday to the lumbar spine.     Carola Rhine, PAC

## 2017-04-04 NOTE — Progress Notes (Signed)
I examined this patient today together with resident physician Dr. Ina Homes and 70 medical student Ms. Eston Esters and I concur with her evaluation and management plan which we discussed together. He is now completed 5 days of prednisone as preliminary treatment for presumed lymphoma. Biopsy of left neck mass confirms high-grade non-Hodgkin's lymphoma.  Additional studies being done on the material for subclassification. Echocardiogram shows ejection fraction 50-55% with normal left ventricular wall motion. Lengthy discussion with multiple family members prior to final results of biopsy. We will now coordinate his first treatment with oncology. They are very interested in him going to an extended care facility for short-term care when he is otherwise stable for discharge. I feel he should get his first treatment in the hospital.

## 2017-04-04 NOTE — Procedures (Signed)
PROCEDURE:  Bone marrow aspirate and biopsy.  INDICATION:  SuspectedMultifocal High-grade Non-Hodgkin's Lymphoma  PROCEDURE OPERATOR: Dr. Murriel Hopper (Assisted by Dr. Ina Homes)  CONSENT: Consent was obtained from the patient. The risks and benefits were explained. The patient agreed to undergo the procedure. The consent form was signed and placed in the chart.   PROCEDURE SUMMARY:  The patient was laid in the pronated position. The left posterior superior iliac spine was prepped and draped in a sterile fashion. The patient was premedicated with 1 mg of Ativan IV. The crest of the posterior superior iliac spine was located, and the skin as well as the surface of the bone was anesthetized with 2% lidocaine. A Kelly needle was introduced, and bone marrow aspirate was obtained without any difficulty. This was withdrawn, and the Jamshidi needle was advanced into the bone cavity. A bone marrow biopsy was obtained without any complications. Dr. Beryle Beams was present for the critical part of the procedure.

## 2017-04-05 ENCOUNTER — Telehealth: Payer: Self-pay | Admitting: *Deleted

## 2017-04-05 ENCOUNTER — Other Ambulatory Visit: Payer: Self-pay | Admitting: Hematology

## 2017-04-05 ENCOUNTER — Ambulatory Visit: Payer: Commercial Managed Care - HMO | Admitting: Radiation Oncology

## 2017-04-05 MED ORDER — ONDANSETRON HCL 4 MG/2ML IJ SOLN: 4.0000 mg | Freq: Once | INTRAMUSCULAR | Status: DC | PRN

## 2017-04-05 MED ORDER — FENTANYL CITRATE (PF) 100 MCG/2ML IJ SOLN: 25.0000 ug | INTRAMUSCULAR | Status: DC | PRN

## 2017-04-05 NOTE — Progress Notes (Signed)
After speaking with Dr. Irene Limbo, the plan is for high dose chemotherapy for his lymphoma. As the patient is neurologically intact as best we can tell, there is no role at this time for radiotherapy, and we will be available moving forward as needed, but hopeful that his chemotherapy will address his paraspinal disease. Please don't hesitate to call us if you have questions regarding this patient's case.     Carola Rhine, [place]

## 2017-04-05 NOTE — Progress Notes (Signed)
PROGRESS NOTE    Tyler [name]  YBO:[id number] DOB: May 29, 1951 DOA: 03/27/2017 PCP: Andreas Blower, MD    Brief Narrative:  66 y/o with hypercalcemia who was found to have a new diagnosis of non hodgkin lymphoma. 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) - oncology and radiation oncology on board and managing. - continue supportive therapy.  Principal Problem:   Hypercalcemia - resolved  Active Problems:   Generalized weakness - Pt to continue PT services     History of traumatic brain injury (1976)   Acute Kidney Injury (AKI)   Normocytic anemia   Pressure injury of skin      DVT prophylaxis: Heparin Code Status: Full Family Communication: none at bedside. Disposition Plan:  Pending oncology recommendations    Consultants:   Oncology  Radiation oncology   Procedures: biopsy   Antimicrobials: none   Subjective: Pt has no new complaints reported to me.  Objective: Vitals:   04/04/17 2108 04/05/17 0021 04/05/17 0439 04/05/17 0922  BP: 125/73 (!) 147/84 (!) 152/72   Pulse: (!) 102 97 87   Resp: 17 18 18    Temp: 98.7 F (37.1 C) 98.6 F (37 C) 98.9 F (37.2 C)   TempSrc:  Oral Oral   SpO2: 97% 94% 97%   Weight: 75 kg (165 lb 5.5 oz) 79 kg (174 lb 2.6 oz)  75.2 kg (165 lb 12.8 oz)  Height:  6' (1.829 m)      Intake/Output Summary (Last 24 hours) at 04/05/17 1018 Last data filed at 04/05/17 0615  Gross per 24 hour  Intake             1680 ml  Output              875 ml  Net              805 ml   Filed Weights   04/04/17 2108 04/05/17 0021 04/05/17 0922  Weight: 75 kg (165 lb 5.5 oz) 79 kg (174 lb 2.6 oz) 75.2 kg (165 lb 12.8 oz)    Examination:  General exam: Appears calm and comfortable, in nad. Respiratory system: Clear to auscultation. Respiratory effort  normal. Cardiovascular system: S1 & S2 heard, RRR. No JVD, murmurs, rubs, gallops or clicks. No pedal edema. Gastrointestinal system: Abdomen is nondistended, soft and nontender.  Central nervous system: Alert and oriented. No focal neurological deficits. Extremities: Symmetric 5 x 5 power Skin: No rashes, lesions, warm and dry Psychiatry: Mood & affect appropriate.     Data Reviewed: I have personally reviewed following labs and imaging studies  CBC:  Recent Labs Lab 03/30/17 0405 03/31/17 0438 04/03/17 0236 04/04/17 0808  WBC 13.5* 10.2 12.2* 16.5*  NEUTROABS  --   --   --  14.2*  HGB 9.6* 10.7* 9.0* 10.4*  HCT 29.1* 32.8* 27.7* 31.4*  MCV 89.3 88.9 89.4 88.5  PLT 392 358 387 277*   Basic Metabolic Panel:  Recent Labs Lab 03/30/17 0405 03/31/17 0438 04/01/17 0632 04/03/17 0236 04/04/17 0552  NA 131* 131* 130* 133* 132*  K 3.6 3.9 4.5 3.8 3.7  CL 101 103 104 106 103  CO2 23 22 20* 24 25  GLUCOSE 105* 153* 131* 133* 116*  BUN 16 18 23* 22* 19  CREATININE 1.48* 1.38* 1.02 1.03 0.86  CALCIUM 8.4* 8.3* 7.9* 7.5* 7.5*  GFR: Estimated Creatinine Clearance: 88.7 mL/min (by C-G formula based on SCr of 0.86 mg/dL). Liver Function Tests:  Recent Labs Lab 03/30/17 0405 03/31/17 0438 04/01/17 5643 04/03/17 0236 04/04/17 0552  AST 115* 69* 51* 35 44*  ALT 126* 105* 72* 63 83*  ALKPHOS 137* 153* 111 89 88  BILITOT 3.2* 2.0* 1.5* 0.6 0.6  PROT 5.4* 5.7* 5.0* 4.8* 4.6*  ALBUMIN 2.1* 2.2* 2.1* 2.0* 2.1*   No results for input(s): LIPASE, AMYLASE in the last 168 hours. No results for input(s): AMMONIA in the last 168 hours. Coagulation Profile:  Recent Labs Lab 03/29/17 1102  INR 1.07   Cardiac Enzymes: No results for input(s): CKTOTAL, CKMB, CKMBINDEX, TROPONINI in the last 168 hours. BNP (last 3 results) No results for input(s): PROBNP in the last 8760 hours. HbA1C: No results for input(s): HGBA1C in the last 72 hours. CBG: No results for input(s):  GLUCAP in the last 168 hours. Lipid Profile: No results for input(s): CHOL, HDL, LDLCALC, TRIG, CHOLHDL, LDLDIRECT in the last 72 hours. Thyroid Function Tests: No results for input(s): TSH, T4TOTAL, FREET4, T3FREE, THYROIDAB in the last 72 hours. Anemia Panel: No results for input(s): VITAMINB12, FOLATE, FERRITIN, TIBC, IRON, RETICCTPCT in the last 72 hours. Sepsis Labs: No results for input(s): PROCALCITON, LATICACIDVEN in the last 168 hours.  Recent Results (from the past 240 hour(s))  Blood Culture (routine x 2)     Status: None   Collection Time: 03/27/17 11:39 AM  Result Value Ref Range Status   Specimen Description BLOOD LEFT ANTECUBITAL  Final   Special Requests   Final    BOTTLES DRAWN AEROBIC AND ANAEROBIC Blood Culture adequate volume   Culture NO GROWTH 5 DAYS  Final   Report Status 04/01/2017 FINAL  Final  Blood Culture (routine x 2)     Status: None   Collection Time: 03/27/17  1:15 PM  Result Value Ref Range Status   Specimen Description BLOOD RIGHT ANTECUBITAL  Final   Special Requests   Final    BOTTLES DRAWN AEROBIC AND ANAEROBIC Blood Culture adequate volume   Culture NO GROWTH 5 DAYS  Final   Report Status 04/01/2017 FINAL  Final         Radiology Studies: No results found.      Scheduled Meds: . allopurinol  300 mg Oral Daily  . feeding supplement (ENSURE ENLIVE)  237 mL Oral TID BM  . heparin subcutaneous  5,000 Units Subcutaneous Q8H   Continuous Infusions: . sodium chloride 75 mL/hr at 04/03/17 1504  . lactated ringers Stopped (04/03/17 0700)     LOS: 9 days    Time spent: > 35 minutes    Velvet Bathe, MD [facility] Pager [phone]  If 7PM-7AM, please contact night-coverage [email or web address] Password [facility] 04/05/2017, 10:18 AM

## 2017-04-05 NOTE — Progress Notes (Signed)
Physical Therapy Treatment Patient Details Name: Tyler [name] MRN: [id number] DOB: 1951/02/01 Today's Date: 04/05/2017    History of Present Illness 66 y.o. 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    PT Comments    Pt cooperative and progressing with mobility but continues to require constant input for direction and safety awareness.   Follow Up Recommendations  SNF;Supervision/Assistance - 24 hour     Equipment Recommendations  Rolling walker with 5" wheels;3in1 (PT)    Recommendations for Other Services       Precautions / Restrictions Precautions Precautions: Fall Restrictions Weight Bearing Restrictions: No    Mobility  Bed Mobility Overal bed mobility: Needs Assistance Bed Mobility: Supine to Sit     Supine to sit: Min guard;HOB elevated Sit to supine: Min guard   General bed mobility comments: assist for catheter line only.  Cues for tasks  Transfers Overall transfer level: Needs assistance Equipment used: Rolling walker (2 wheeled) Transfers: Sit to/from Stand Sit to Stand: Min guard         General transfer comment: cues for hand placement  Ambulation/Gait Ambulation/Gait assistance: Min assist;Min guard Ambulation Distance (Feet): 450 Feet Assistive device: Rolling walker (2 wheeled) Gait Pattern/deviations: Step-through pattern;Decreased stride length;Shuffle;Trunk flexed Gait velocity: decreased Gait velocity interpretation: Below normal speed for age/gender General Gait Details: Flexed posture with RW too far ahead of him.  Cues for upright posture and to stay close to RW.    Stairs            Wheelchair Mobility    Modified Rankin (Stroke Patients Only)       Balance Overall balance assessment: Needs assistance Sitting-balance support: Feet supported Sitting balance-Leahy Scale: Good       Standing  balance-Leahy Scale: Poor                              Cognition Arousal/Alertness: Awake/alert Behavior During Therapy: WFL for tasks assessed/performed Overall Cognitive Status: History of cognitive impairments - at baseline                                 General Comments: Pt requires frequesnt redirection to task and consistent/moderate vc's for safety/sequencing with tasks performed.      Exercises      General Comments        Pertinent Vitals/Pain Pain Assessment: No/denies pain    Home Living                      Prior Function            PT Goals (current goals can now be found in the care plan section) Acute Rehab PT Goals Patient Stated Goal: did not state PT Goal Formulation: Patient unable to participate in goal setting Time For Goal Achievement: 04/11/17 Potential to Achieve Goals: Good Progress towards PT goals: Progressing toward goals    Frequency    Min 2X/week      PT Plan Current plan remains appropriate    Co-evaluation              AM-PAC PT "6 Clicks" Daily Activity  Outcome Measure  Difficulty turning over in bed (including adjusting bedclothes, sheets and blankets)?: A Little Difficulty moving from lying on  back to sitting on the side of the bed? : A Little Difficulty sitting down on and standing up from a chair with arms (e.g., wheelchair, bedside commode, etc,.)?: A Lot Help needed moving to and from a bed to chair (including a wheelchair)?: A Little Help needed walking in hospital room?: A Little Help needed climbing 3-5 steps with a railing? : A Little 6 Click Score: 17    End of Session Equipment Utilized During Treatment: Gait belt Activity Tolerance: Patient tolerated treatment well;Patient limited by fatigue Patient left: in chair;with call bell/phone within reach Nurse Communication: Mobility status PT Visit Diagnosis: Unsteadiness on feet (R26.81);Other abnormalities of gait and  mobility (R26.89);Muscle weakness (generalized) (M62.81)     Time: 1884-1660 PT Time Calculation (min) (ACUTE ONLY): 19 min  Charges:  $Gait Training: 8-22 mins                    G Codes:       Pg [phone]    Girolamo Lortie 04/05/2017, 4:23 PM
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
## 2017-04-05 NOTE — Progress Notes (Signed)
Pt's Uncle has many questions regarding pt's plan of care and treatment. Dr. Irene Limbo paged to answer questions for pt and family.

## 2017-04-05 NOTE — Progress Notes (Signed)
Occupational Therapy Treatment Patient Details Name: Tyler [name] MRN: [id number] DOB: August 21, 1950 Today's Date: 04/05/2017    History of present illness 66 y.o. 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   OT comments  Pt agreeable OT.  Walked with PT shortly prior to my arrival and requested back to bed at end of session  Follow Up Recommendations  SNF;Supervision/Assistance - 24 hour    Equipment Recommendations  3 in 1 bedside commode    Recommendations for Other Services      Precautions / Restrictions Precautions Precautions: Fall Restrictions Weight Bearing Restrictions: No       Mobility Bed Mobility           Sit to supine: Min guard   General bed mobility comments: assist for catheter line only.  Cues to sequence  Transfers   Equipment used: Rolling walker (2 wheeled)   Sit to Stand: Min guard         General transfer comment: cues for hand placement    Balance             Standing balance-Leahy Scale: Poor                             ADL either performed or assessed with clinical judgement   ADL       Grooming: Oral care;Min guard;Standing                                 General ADL Comments: stood to brush teeth with min guard for balance.  Cues to find cup of water to rinse with.  Guarded head on cabinet. Pt fatiqued and did not want to perform another task in standing.  Multimodal cues for safety to walk back to bed     Vision       Perception     Praxis      Cognition Arousal/Alertness: Awake/alert Behavior During Therapy: [facility] for tasks assessed/performed Overall Cognitive Status: History of cognitive impairments - at baseline                                 General Comments: Pt requires frequesnt redirection to task and consistent/moderate vc's for  safety/sequencing with tasks performed.        Exercises     Shoulder Instructions       General Comments      Pertinent Vitals/ Pain       Pain Assessment: No/denies pain  Home Living                                          Prior Functioning/Environment              Frequency  Min 2X/week        Progress Toward Goals  OT Goals(current goals can now be found in the care plan section)  Progress towards OT goals: Progressing toward goals  Acute Rehab OT Goals Time For Goal Achievement: 04/12/17 Potential to Achieve Goals: Good  Plan      Co-evaluation  AM-PAC PT "6 Clicks" Daily Activity     Outcome Measure   Help from another person eating meals?: None Help from another person taking care of personal grooming?: A Little Help from another person toileting, which includes using toliet, bedpan, or urinal?: A Little Help from another person bathing (including washing, rinsing, drying)?: A Little Help from another person to put on and taking off regular upper body clothing?: A Little Help from another person to put on and taking off regular lower body clothing?: A Little 6 Click Score: 19    End of Session        Activity Tolerance Patient limited by fatigue   Patient Left in bed;with call bell/phone within reach;with bed alarm set   Nurse Communication          Time: 1308-6578 OT Time Calculation (min): 10 min  Charges: OT General Charges $OT Visit: 1 Visit OT Treatments $Self Care/Home Management : 8-22 mins  Lesle Chris, OTR/L [phone] 04/05/2017   Tyler [name] 04/05/2017, 3:16 PM

## 2017-04-05 NOTE — Telephone Encounter (Signed)
Called patin6 uncle home number,patient aunt Romie Minus answered, said the uncle would be home soon and will return our call to speak with him regarding patient  Need for consent for ct simulation today at 3pm, will inform AlisonPerkins PAC, aunt confiirmed call back number to [phone] 9:09 AM

## 2017-04-05 NOTE — Progress Notes (Signed)
START ON PATHWAY REGIMEN - Lymphoma and CLL     A cycle is every 21 days:     Rituximab      Cyclophosphamide      Doxorubicin      Vincristine      Prednisone   **Always confirm dose/schedule in your pharmacy ordering system**    Patient Characteristics: Diffuse Large B Cell Lymphoma, First Line, Stage III and IV Disease Type: Not Applicable Disease Type: Diffuse Large B Cell Line of therapy: First Line [place] Stage: III Intent of Therapy: Curative Intent, Discussed with Patient

## 2017-04-05 NOTE — Clinical Social Work Note (Signed)
Call received from Theda Belfast with [facility] regarding patient coming to their facility for [place] re[facility]. CSW informed by Margarita Grizzle that they are now under an admit band and are not accepting patients right now - due to staffing and the imminent bad weather forecast.  Clinicals faxed to [facility] this morning and call made to [facility], [place] contact [phone], ext. [phone]) and message left regarding patient. Call made to patient's sister [name] [phone]) and updated her on [facility] not being able to accept patient. Their other choices are (1) [facility] and (2) [facility].  CSW noted that patient has moved to [place], room 1324. Call made to [place] and message left.    Kennede Lusk Givens, MSW, LCSW Licensed Clinical Social Worker [place] [phone]

## 2017-04-06 LAB — CBC WITH DIFFERENTIAL/PLATELET
Basophils Absolute: 0 10*3/uL (ref 0.0–0.1)
Basophils Relative: 0 %
EOS PCT: 3 %
Eosinophils Absolute: 0.4 10*3/uL (ref 0.0–0.7)
HCT: 33.9 % — ABNORMAL LOW (ref 39.0–52.0)
Hemoglobin: 11.2 g/dL — ABNORMAL LOW (ref 13.0–17.0)
LYMPHS ABS: 1.2 10*3/uL (ref 0.7–4.0)
Lymphocytes Relative: 9 %
MCH: 30.2 pg (ref 26.0–34.0)
MCHC: 33 g/dL (ref 30.0–36.0)
MCV: 91.4 fL (ref 78.0–100.0)
MONO ABS: 1.2 10*3/uL — AB (ref 0.1–1.0)
MONOS PCT: 9 %
Neutro Abs: 10.6 10*3/uL — ABNORMAL HIGH (ref 1.7–7.7)
Neutrophils Relative %: 79 %
PLATELETS: 428 10*3/uL — AB (ref 150–400)
RBC: 3.71 MIL/uL — ABNORMAL LOW (ref 4.22–5.81)
RDW: 16.3 % — ABNORMAL HIGH (ref 11.5–15.5)
WBC: 13.3 10*3/uL — ABNORMAL HIGH (ref 4.0–10.5)

## 2017-04-06 LAB — MAGNESIUM: MAGNESIUM: 1.6 mg/dL — AB (ref 1.7–2.4)

## 2017-04-06 LAB — COMPREHENSIVE METABOLIC PANEL
ALT: 54 U/L (ref 17–63)
AST: 23 U/L (ref 15–41)
Albumin: 2.4 g/dL — ABNORMAL LOW (ref 3.5–5.0)
Alkaline Phosphatase: 91 U/L (ref 38–126)
Anion gap: 4 — ABNORMAL LOW (ref 5–15)
BILIRUBIN TOTAL: 0.7 mg/dL (ref 0.3–1.2)
BUN: 14 mg/dL (ref 6–20)
CHLORIDE: 100 mmol/L — AB (ref 101–111)
CO2: 29 mmol/L (ref 22–32)
CREATININE: 1.03 mg/dL (ref 0.61–1.24)
Calcium: 8 mg/dL — ABNORMAL LOW (ref 8.9–10.3)
Glucose, Bld: 131 mg/dL — ABNORMAL HIGH (ref 65–99)
POTASSIUM: 4.3 mmol/L (ref 3.5–5.1)
Sodium: 133 mmol/L — ABNORMAL LOW (ref 135–145)
TOTAL PROTEIN: 5.2 g/dL — AB (ref 6.5–8.1)

## 2017-04-06 LAB — HEPATITIS B CORE ANTIBODY, TOTAL: Hep B Core Total Ab: NEGATIVE

## 2017-04-06 LAB — HEPATITIS B SURFACE ANTIGEN: HEP B S AG: NEGATIVE

## 2017-04-06 MED ORDER — VINCRISTINE SULFATE CHEMO INJECTION 1 MG/ML
2.0000 mg | Freq: Once | INTRAVENOUS | Status: AC
  Administered 2017-04-06: 2 mg via INTRAVENOUS
  Filled 2017-04-06: qty 2

## 2017-04-06 MED ORDER — PALONOSETRON HCL INJECTION 0.25 MG/5ML
0.2500 mg | Freq: Once | INTRAVENOUS | Status: AC
  Administered 2017-04-06: 0.25 mg via INTRAVENOUS
  Filled 2017-04-06: qty 5

## 2017-04-06 MED ORDER — SODIUM CHLORIDE 0.9 % IV SOLN
750.0000 mg/m2 | Freq: Once | INTRAVENOUS | Status: AC
  Administered 2017-04-06: 1460 mg via INTRAVENOUS
  Filled 2017-04-06: qty 73

## 2017-04-06 MED ORDER — EPINEPHRINE PF 1 MG/10ML IJ SOSY: 0.2500 mg | PREFILLED_SYRINGE | Freq: Once | INTRAMUSCULAR | Status: DC | PRN

## 2017-04-06 MED ORDER — SODIUM CHLORIDE 0.9% FLUSH: 3.0000 mL | INTRAVENOUS | Status: DC | PRN

## 2017-04-06 MED ORDER — DIPHENHYDRAMINE HCL 50 MG/ML IJ SOLN: 50.0000 mg | Freq: Once | INTRAMUSCULAR | Status: DC | PRN

## 2017-04-06 MED ORDER — COLD PACK MISC ONCOLOGY
1.0000 | Freq: Once | Status: AC | PRN
  Filled 2017-04-06: qty 1

## 2017-04-06 MED ORDER — DIPHENHYDRAMINE HCL 50 MG PO CAPS
50.0000 mg | ORAL_CAPSULE | Freq: Once | ORAL | Status: AC
  Administered 2017-04-06: 50 mg via ORAL
  Filled 2017-04-06: qty 1

## 2017-04-06 MED ORDER — SODIUM CHLORIDE 0.9 % IV SOLN: Freq: Once | INTRAVENOUS | Status: DC | PRN

## 2017-04-06 MED ORDER — ACETAMINOPHEN 325 MG PO TABS
650.0000 mg | ORAL_TABLET | Freq: Once | ORAL | Status: AC
  Administered 2017-04-06: 650 mg via ORAL
  Filled 2017-04-06: qty 2

## 2017-04-06 MED ORDER — METHYLPREDNISOLONE SODIUM SUCC 125 MG IJ SOLR: 125.0000 mg | Freq: Once | INTRAMUSCULAR | Status: DC | PRN

## 2017-04-06 MED ORDER — FAMOTIDINE IN NACL 20-0.9 MG/50ML-% IV SOLN
20.0000 mg | Freq: Once | INTRAVENOUS | Status: DC | PRN
  Filled 2017-04-06: qty 50

## 2017-04-06 MED ORDER — ALBUTEROL SULFATE (2.5 MG/3ML) 0.083% IN NEBU: 2.5000 mg | INHALATION_SOLUTION | Freq: Once | RESPIRATORY_TRACT | Status: DC | PRN

## 2017-04-06 MED ORDER — HEPARIN SOD (PORK) LOCK FLUSH 100 UNIT/ML IV SOLN: 250.0000 [IU] | Freq: Once | INTRAVENOUS | Status: DC | PRN

## 2017-04-06 MED ORDER — SODIUM CHLORIDE 0.9 % IV SOLN: Freq: Once | INTRAVENOUS | Status: DC

## 2017-04-06 MED ORDER — HEPARIN SOD (PORK) LOCK FLUSH 100 UNIT/ML IV SOLN: 500.0000 [IU] | Freq: Once | INTRAVENOUS | Status: DC | PRN

## 2017-04-06 MED ORDER — SODIUM CHLORIDE 0.9 % IV SOLN
375.0000 mg/m2 | Freq: Once | INTRAVENOUS | Status: AC
  Administered 2017-04-07: 700 mg via INTRAVENOUS
  Filled 2017-04-06: qty 20

## 2017-04-06 MED ORDER — DOXORUBICIN HCL CHEMO IV INJECTION 2 MG/ML
50.0000 mg/m2 | Freq: Once | INTRAVENOUS | Status: AC
  Administered 2017-04-06: 98 mg via INTRAVENOUS
  Filled 2017-04-06: qty 49

## 2017-04-06 MED ORDER — DIPHENHYDRAMINE HCL 50 MG/ML IJ SOLN: 25.0000 mg | Freq: Once | INTRAMUSCULAR | Status: DC | PRN

## 2017-04-06 MED ORDER — CEFAZOLIN SODIUM-DEXTROSE 2-4 GM/100ML-% IV SOLN
2.0000 g | INTRAVENOUS | Status: AC
  Administered 2017-04-07: 2 g via INTRAVENOUS
  Filled 2017-04-06: qty 100

## 2017-04-06 MED ORDER — EPINEPHRINE PF 1 MG/ML IJ SOLN
0.5000 mg | Freq: Once | INTRAMUSCULAR | Status: DC | PRN
  Filled 2017-04-06: qty 1

## 2017-04-06 MED ORDER — SODIUM CHLORIDE 0.9% FLUSH: 10.0000 mL | INTRAVENOUS | Status: DC | PRN

## 2017-04-06 MED ORDER — DEXAMETHASONE SODIUM PHOSPHATE 100 MG/10ML IJ SOLN
10.0000 mg | Freq: Once | INTRAMUSCULAR | Status: AC
  Administered 2017-04-06: 10 mg via INTRAVENOUS
  Filled 2017-04-06: qty 1

## 2017-04-06 MED ORDER — HOT PACK MISC ONCOLOGY
1.0000 | Freq: Once | Status: AC | PRN
  Filled 2017-04-06: qty 1

## 2017-04-06 MED ORDER — ALTEPLASE 2 MG IJ SOLR
2.0000 mg | Freq: Once | INTRAMUSCULAR | Status: DC | PRN
  Filled 2017-04-06: qty 2

## 2017-04-06 NOTE — Progress Notes (Signed)
Manual calculation of BSA and dosing for chemotherapy completed.  Verification by Clotilde Dieter RN and Jena Gauss RN

## 2017-04-06 NOTE — Progress Notes (Signed)
CM continuing to follow along for DC needs. DC plan continues to be for SNF placement. Marney Doctor RN,BSN,NCM [phone]

## 2017-04-06 NOTE — Progress Notes (Addendum)
CSW following for DC planning. Pt referred to SNFs, family's choices unable to offer bed. Awaiting more information re treatment plan in order to advise facilities of pt's needs and pursue specific facilities.  Pt's uncle main decision maker- met with uncle briefly today and returned to discuss however pt reported uncle and sister had to leave. Left voicemail with uncle to return call. Pt will need facility-obtained [employer] medicare pre-authorization for SNF once a facility is selected.   Sharren Bridge, MSW, LCSW Clinical Social Work 04/06/2017 [phone]  16:00 Spoke with pt's uncle and sister. They updated CSW that they are interested in [facility] for rehab once pt ready for DC. Educated them again on process of placement and current barriers (awaiting treatment plan, will need insurance pre-auth, etc.). They are understanding. They report they are prepared to consider paying out of pocket for SNF should the need arise. CSW contacted [facility] to inform them of pt's interest and will continue assisting with planning.
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
## 2017-04-06 NOTE — Progress Notes (Addendum)
Spoke with Dr Jana Hakim concerning patients Rituxan not being started yet today due to lateness of infusions because patient only had a peripheral IV and we had to infuse the chemotherapy at a slower rate for safety. Advised by Dr Jana Hakim to start Rituxan infusion on Friday morning 04/07/2017. Spoke with pharmacy and they said as long as we hang before 1100 it is fine.
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
## 2017-04-06 NOTE — Progress Notes (Signed)
PROGRESS NOTE    Tyler [name]  XBJ:[id number] DOB: February 26, 1951 DOA: 03/27/2017 PCP: Andreas Blower, MD    Brief Narrative:  66 y/o with hypercalcemia who was found to have a new diagnosis of non hodgkin lymphoma. Pt transferred to [facility] long hospital for further treatment options from oncological services   Assessment & Plan:     Neck mass/ Adenopathy/ Retroperitoneal lymphadenopathy/  Large cell lymphoma [facility]) - oncology and radiation oncology on board and managing. - Will continue supportive therapy.  Principal Problem:   Hypercalcemia - resolved after correction accounting for hypoalbuminemia  Active Problems:   Generalized weakness - Pt to continue PT services     History of traumatic brain injury (1976)   Acute Kidney Injury (AKI)   Normocytic anemia   Pressure injury of skin   DVT prophylaxis: Heparin Code Status: Full Family Communication: none at bedside. Disposition Plan:  Pending oncology recommendations    Consultants:   Oncology  Radiation oncology   Procedures: biopsy   Antimicrobials: none   Subjective: Pt has no new complaints reported to me.  Objective: Vitals:   04/05/17 1320 04/05/17 2231 04/06/17 0438 04/06/17 1429  BP: 138/88 136/72 (!) 154/88 122/78  Pulse: (!) 101 100 95 (!) 108  Resp: 16 17 18 17   Temp: 98.4 F (36.9 C) 98.2 F (36.8 C) 98.7 F (37.1 C) 98.4 F (36.9 C)  TempSrc: Oral Oral Oral Oral  SpO2: 97% 97% 96% 98%  Weight:      Height:        Intake/Output Summary (Last 24 hours) at 04/06/17 1836 Last data filed at 04/06/17 1400  Gross per 24 hour  Intake              480 ml  Output             1400 ml  Net             -920 ml   Filed Weights   04/04/17 2108 04/05/17 0021 04/05/17 0922  Weight: 75 kg (165 lb 5.5 oz) 79 kg (174 lb 2.6 oz) 75.2 kg (165 lb 12.8 oz)    Examination:Exam unchanged compared to 04/05/2017  General exam: Appears calm and comfortable, in nad. Respiratory system: Clear to  auscultation. Respiratory effort normal. Cardiovascular system: S1 & S2 heard, RRR. No JVD, murmurs, rubs, gallops or clicks. No pedal edema. Gastrointestinal system: Abdomen is nondistended, soft and nontender.  Central nervous system: Alert and oriented. No focal neurological deficits. Extremities: Symmetric 5 x 5 power Skin: No rashes, lesions, warm and dry Psychiatry: Mood & affect appropriate.   Data Reviewed: I have personally reviewed following labs and imaging studies  CBC:  Recent Labs Lab 03/31/17 0438 04/03/17 0236 04/04/17 0808 04/06/17 1553  WBC 10.2 12.2* 16.5* 13.3*  NEUTROABS  --   --  14.2* 10.6*  HGB 10.7* 9.0* 10.4* 11.2*  HCT 32.8* 27.7* 31.4* 33.9*  MCV 88.9 89.4 88.5 91.4  PLT 358 387 478* 308*   Basic Metabolic Panel:  Recent Labs Lab 03/31/17 0438 04/01/17 0632 04/03/17 0236 04/04/17 0552 04/06/17 1553  NA 131* 130* 133* 132* 133*  K 3.9 4.5 3.8 3.7 4.3  CL 103 104 106 103 100*  CO2 22 20* 24 25 29   GLUCOSE 153* 131* 133* 116* 131*  BUN 18 23* 22* 19 14  CREATININE 1.38* 1.02 1.03 0.86 1.03  CALCIUM 8.3* 7.9* 7.5* 7.5* 8.0*  MG  --   --   --   --  1.6*   GFR: Estimated Creatinine Clearance: 74 mL/min (by C-G formula based on SCr of 1.03 mg/dL). Liver Function Tests:  Recent Labs Lab 03/31/17 0438 04/01/17 5277 04/03/17 0236 04/04/17 0552 04/06/17 1553  AST 69* 51* 35 44* 23  ALT 105* 72* 63 83* 54  ALKPHOS 153* 111 89 88 91  BILITOT 2.0* 1.5* 0.6 0.6 0.7  PROT 5.7* 5.0* 4.8* 4.6* 5.2*  ALBUMIN 2.2* 2.1* 2.0* 2.1* 2.4*   No results for input(s): LIPASE, AMYLASE in the last 168 hours. No results for input(s): AMMONIA in the last 168 hours. Coagulation Profile: No results for input(s): INR, PROTIME in the last 168 hours. Cardiac Enzymes: No results for input(s): CKTOTAL, CKMB, CKMBINDEX, TROPONINI in the last 168 hours. BNP (last 3 results) No results for input(s): PROBNP in the last 8760 hours. HbA1C: No results for  input(s): HGBA1C in the last 72 hours. CBG: No results for input(s): GLUCAP in the last 168 hours. Lipid Profile: No results for input(s): CHOL, HDL, LDLCALC, TRIG, CHOLHDL, LDLDIRECT in the last 72 hours. Thyroid Function Tests: No results for input(s): TSH, T4TOTAL, FREET4, T3FREE, THYROIDAB in the last 72 hours. Anemia Panel: No results for input(s): VITAMINB12, FOLATE, FERRITIN, TIBC, IRON, RETICCTPCT in the last 72 hours. Sepsis Labs: No results for input(s): PROCALCITON, LATICACIDVEN in the last 168 hours.  No results found for this or any previous visit (from the past 240 hour(s)).       Radiology Studies: No results found.      Scheduled Meds: . allopurinol  300 mg Oral Daily  . feeding supplement (ENSURE ENLIVE)  237 mL Oral TID BM  . heparin subcutaneous  5,000 Units Subcutaneous Q8H  . riTUXimab (RITUXAN) IV infusion  375 mg/m2 (Treatment Plan Recorded) Intravenous Once   Continuous Infusions: . sodium chloride 75 mL/hr at 04/03/17 1504  . sodium chloride    . sodium chloride    . [START ON 04/07/2017]  ceFAZolin (ANCEF) IV    . famotidine    . lactated ringers Stopped (04/03/17 0700)     LOS: 10 days    Time spent: > 15 minutes    Velvet Bathe, MD [facility] Pager [phone]  If 7PM-7AM, please contact night-coverage [email or web address] Password [facility] 04/06/2017, 6:36 PM

## 2017-04-06 NOTE — Progress Notes (Signed)
Chemotherapy consent signed for Rituximab, Cyclophosphamide, Doxorubicin,  Vincristine and Prednisone.
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
## 2017-04-07 ENCOUNTER — Inpatient Hospital Stay (HOSPITAL_COMMUNITY): Payer: Medicare HMO

## 2017-04-07 ENCOUNTER — Encounter (HOSPITAL_COMMUNITY): Payer: Self-pay | Admitting: Interventional Radiology

## 2017-04-07 HISTORY — PX: IR US GUIDE VASC ACCESS RIGHT: IMG2390

## 2017-04-07 HISTORY — PX: IR FLUORO GUIDE PORT INSERTION RIGHT: IMG5741

## 2017-04-07 LAB — CBC WITH DIFFERENTIAL/PLATELET
Basophils Absolute: 0 10*3/uL (ref 0.0–0.1)
Basophils Relative: 0 %
Eosinophils Absolute: 0 10*3/uL (ref 0.0–0.7)
Eosinophils Relative: 0 %
HCT: 32.6 % — ABNORMAL LOW (ref 39.0–52.0)
HEMOGLOBIN: 11.3 g/dL — AB (ref 13.0–17.0)
LYMPHS ABS: 0.9 10*3/uL (ref 0.7–4.0)
LYMPHS PCT: 7 %
MCH: 31.2 pg (ref 26.0–34.0)
MCHC: 34.7 g/dL (ref 30.0–36.0)
MCV: 90.1 fL (ref 78.0–100.0)
MONOS PCT: 3 %
Monocytes Absolute: 0.4 10*3/uL (ref 0.1–1.0)
NEUTROS ABS: 11.2 10*3/uL — AB (ref 1.7–7.7)
NEUTROS PCT: 90 %
Platelets: 391 10*3/uL (ref 150–400)
RBC: 3.62 MIL/uL — ABNORMAL LOW (ref 4.22–5.81)
RDW: 16.2 % — ABNORMAL HIGH (ref 11.5–15.5)
WBC: 12.5 10*3/uL — ABNORMAL HIGH (ref 4.0–10.5)

## 2017-04-07 LAB — BASIC METABOLIC PANEL
ANION GAP: 4 — AB (ref 5–15)
BUN: 14 mg/dL (ref 6–20)
CO2: 24 mmol/L (ref 22–32)
Calcium: 7.6 mg/dL — ABNORMAL LOW (ref 8.9–10.3)
Chloride: 102 mmol/L (ref 101–111)
Creatinine, Ser: 0.87 mg/dL (ref 0.61–1.24)
GFR calc non Af Amer: >60 mL/min (ref >60)
GLUCOSE: 123 mg/dL — AB (ref 65–99)
POTASSIUM: 4.3 mmol/L (ref 3.5–5.1)
Sodium: 130 mmol/L — ABNORMAL LOW (ref 135–145)

## 2017-04-07 LAB — PROTIME-INR
INR: 0.99
Prothrombin Time: 13 seconds (ref 11.4–15.2)

## 2017-04-07 MED ORDER — MIDAZOLAM HCL 2 MG/2ML IJ SOLN
INTRAMUSCULAR | Status: AC
  Filled 2017-04-07: qty 4

## 2017-04-07 MED ORDER — SODIUM CHLORIDE 0.9% FLUSH
INTRAVENOUS | Status: AC | PRN
  Administered 2017-04-07: 10 mL via INTRAVENOUS

## 2017-04-07 MED ORDER — PREDNISONE 20 MG PO TABS
60.0000 mg | ORAL_TABLET | Freq: Every day | ORAL | Status: AC
  Administered 2017-04-07 – 2017-04-10 (×4): 60 mg via ORAL
  Filled 2017-04-07 (×5): qty 3

## 2017-04-07 MED ORDER — MIDAZOLAM HCL 2 MG/2ML IJ SOLN
INTRAMUSCULAR | Status: AC | PRN
  Administered 2017-04-07 (×2): 1 mg via INTRAVENOUS

## 2017-04-07 MED ORDER — ACETAMINOPHEN 325 MG PO TABS
650.0000 mg | ORAL_TABLET | Freq: Once | ORAL | Status: AC
  Administered 2017-04-07: 650 mg via ORAL
  Filled 2017-04-07: qty 2

## 2017-04-07 MED ORDER — CEFAZOLIN SODIUM-DEXTROSE 2-4 GM/100ML-% IV SOLN
INTRAVENOUS | Status: AC
  Administered 2017-04-07: 2 g via INTRAVENOUS
  Filled 2017-04-07: qty 100

## 2017-04-07 MED ORDER — FENTANYL CITRATE (PF) 100 MCG/2ML IJ SOLN
INTRAMUSCULAR | Status: AC
  Filled 2017-04-07: qty 4

## 2017-04-07 MED ORDER — LIDOCAINE-EPINEPHRINE (PF) 2 %-1:200000 IJ SOLN
INTRAMUSCULAR | Status: AC | PRN
  Administered 2017-04-07: 20 mL

## 2017-04-07 MED ORDER — LIDOCAINE-EPINEPHRINE (PF) 2 %-1:200000 IJ SOLN
INTRAMUSCULAR | Status: AC
  Filled 2017-04-07: qty 20

## 2017-04-07 MED ORDER — SODIUM CHLORIDE 0.9 % IV SOLN
10.0000 mg | Freq: Once | INTRAVENOUS | Status: AC
  Administered 2017-04-07: 10 mg via INTRAVENOUS
  Filled 2017-04-07: qty 1

## 2017-04-07 MED ORDER — PANTOPRAZOLE SODIUM 40 MG PO TBEC
40.0000 mg | DELAYED_RELEASE_TABLET | Freq: Every day | ORAL | Status: DC
  Administered 2017-04-07 – 2017-04-11 (×5): 40 mg via ORAL
  Filled 2017-04-07 (×5): qty 1

## 2017-04-07 MED ORDER — FENTANYL CITRATE (PF) 100 MCG/2ML IJ SOLN
INTRAMUSCULAR | Status: AC | PRN
  Administered 2017-04-07 (×2): 50 ug via INTRAVENOUS

## 2017-04-07 MED ORDER — TBO-FILGRASTIM 300 MCG/0.5ML ~~LOC~~ SOSY
300.0000 ug | PREFILLED_SYRINGE | Freq: Every day | SUBCUTANEOUS | Status: DC
  Administered 2017-04-07 – 2017-04-11 (×5): 300 ug via SUBCUTANEOUS
  Filled 2017-04-07 (×5): qty 0.5

## 2017-04-07 NOTE — Progress Notes (Signed)
Rituxan infusion rate/dosage verified with Aldean Baker, RN.

## 2017-04-07 NOTE — Procedures (Signed)
Interventional Radiology Procedure Note  Procedure: Placement of a right IJ approach single lumen PowerPort.  Tip is positioned at the superior cavoatrial junction and catheter is ready for immediate use.  Complications: No immediate Recommendations:  - Ok to shower tomorrow - Do not submerge for 7 days - Routine line care   Signed,  Nell Schrack K. Eula Mazzola, MD   
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
## 2017-04-07 NOTE — Progress Notes (Signed)
   Subjective:    Patient ID: Tyler [name], male    DOB: 1951-07-15, 66 y.o.   MRN: [id number]  HPI Has started therapy for B-cell lymphoma.  Neck draining mildly.  Review of Systems     Objective:   Physical Exam Alert, NAD Penrose drain and sutures removed.  Wound healing well.     Assessment & Plan:  B-cell lymphoma s/p open neck biopsy.  Drain and sutures removed.  Wound doing fine.  Will close with time.  Continue dry dressing change until drainage stops.

## 2017-04-07 NOTE — Progress Notes (Signed)
PROGRESS NOTE    Tyler [name]  EUM:[id number] DOB: 12-13-1950 DOA: 03/27/2017 PCP: Andreas Blower, MD    Brief Narrative:  66 y/o with hypercalcemia who was found to have a new diagnosis of non hodgkin lymphoma. Pt transferred to [facility] long hospital for further treatment options from oncological services   Assessment & Plan:     Neck mass/ Adenopathy/ Retroperitoneal lymphadenopathy/  Large cell lymphoma [facility]) - oncology and radiation oncology on board and managing. - Will continue supportive therapy. - port to be placed. Plan outlined by Dr. Irene Limbo with oncology:  Will start with Neupogen daily after 24 hours of chemotherapy for 5 days or while in the hospital since we cannot do Neulasta in the hospital. -He will need to be discharged on his supportive medications including antiemetics and prednisone. 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 -Port to be placed soon (ordered)  Principal Problem:   Hypercalcemia - resolved after correction accounting for hypoalbuminemia  Active Problems:   Generalized weakness - Pt to continue PT services     History of traumatic brain injury (1976)   Acute Kidney Injury (AKI)   Normocytic anemia   Pressure injury of skin   DVT prophylaxis: Heparin Code Status: Full Family Communication: none at bedside. Disposition Plan:  Once cleared for discharge by oncologist.   Consultants:   Oncology  Radiation oncology   Procedures: biopsy   Antimicrobials: none   Subjective: No new problems reported to me.  Objective: Vitals:   04/07/17 1120 04/07/17 1150 04/07/17 1210 04/07/17 1411  BP: 138/80 136/75 136/75 (!) 144/69  Pulse: 89 92 85 77  Resp: 16 15 18 18   Temp:  98 F (36.7 C) (!) 97.5 F (36.4 C) 98 F (36.7 C) (!) 97.5 F (36.4 C)  TempSrc: Oral Oral Oral Oral  SpO2: 98% 98% 97% 99%  Weight:      Height:        Intake/Output Summary (Last 24 hours) at 04/07/17 1513 Last data filed at 04/07/17 1411  Gross per 24 hour  Intake           3933.5 ml  Output             3150 ml  Net            783.5 ml   Filed Weights   04/04/17 2108 04/05/17 0021 04/05/17 0922  Weight: 75 kg (165 lb 5.5 oz) 79 kg (174 lb 2.6 oz) 75.2 kg (165 lb 12.8 oz)    Examination:Exam unchanged compared to 04/06/2017  General exam: Appears calm and comfortable, in nad. Respiratory system: Clear to auscultation. Respiratory effort normal. Cardiovascular system: S1 & S2 heard, RRR. No JVD, murmurs, rubs, gallops or clicks. No pedal edema. Gastrointestinal system: Abdomen is nondistended, soft and nontender.  Central nervous system: Alert and oriented. No focal neurological deficits. Extremities: Symmetric 5 x 5 power Skin: No rashes, lesions, warm and dry Psychiatry: Mood & affect appropriate.   Data Reviewed: I have personally reviewed following labs and imaging studies  CBC:  Recent Labs Lab 04/03/17 0236 04/04/17 0808 04/06/17 1553 04/07/17 0350  WBC 12.2* 16.5* 13.3* 12.5*  NEUTROABS  --  14.2* 10.6* 11.2*  HGB 9.0* 10.4* 11.2* 11.3*  HCT 27.7* 31.4*  33.9* 32.6*  MCV 89.4 88.5 91.4 90.1  PLT 387 478* 428* 412   Basic Metabolic Panel:  Recent Labs Lab 04/01/17 0632 04/03/17 0236 04/04/17 0552 04/06/17 1553 04/07/17 0350  NA 130* 133* 132* 133* 130*  K 4.5 3.8 3.7 4.3 4.3  CL 104 106 103 100* 102  CO2 20* 24 25 29 24   GLUCOSE 131* 133* 116* 131* 123*  BUN 23* 22* 19 14 14   CREATININE 1.02 1.03 0.86 1.03 0.87  CALCIUM 7.9* 7.5* 7.5* 8.0* 7.6*  MG  --   --   --  1.6*  --    GFR: Estimated Creatinine Clearance: 87.6 mL/min (by C-G formula based on SCr of 0.87 mg/dL). Liver Function Tests:  Recent Labs Lab 04/01/17 8786 04/03/17 0236  04/04/17 0552 04/06/17 1553  AST 51* 35 44* 23  ALT 72* 63 83* 54  ALKPHOS 111 89 88 91  BILITOT 1.5* 0.6 0.6 0.7  PROT 5.0* 4.8* 4.6* 5.2*  ALBUMIN 2.1* 2.0* 2.1* 2.4*   No results for input(s): LIPASE, AMYLASE in the last 168 hours. No results for input(s): AMMONIA in the last 168 hours. Coagulation Profile:  Recent Labs Lab 04/07/17 0350  INR 0.99   Cardiac Enzymes: No results for input(s): CKTOTAL, CKMB, CKMBINDEX, TROPONINI in the last 168 hours. BNP (last 3 results) No results for input(s): PROBNP in the last 8760 hours. HbA1C: No results for input(s): HGBA1C in the last 72 hours. CBG: No results for input(s): GLUCAP in the last 168 hours. Lipid Profile: No results for input(s): CHOL, HDL, LDLCALC, TRIG, CHOLHDL, LDLDIRECT in the last 72 hours. Thyroid Function Tests: No results for input(s): TSH, T4TOTAL, FREET4, T3FREE, THYROIDAB in the last 72 hours. Anemia Panel: No results for input(s): VITAMINB12, FOLATE, FERRITIN, TIBC, IRON, RETICCTPCT in the last 72 hours. Sepsis Labs: No results for input(s): PROCALCITON, LATICACIDVEN in the last 168 hours.  No results found for this or any previous visit (from the past 240 hour(s)).       Radiology Studies: No results found.      Scheduled Meds: . fentaNYL      . lidocaine-EPINEPHrine      . midazolam      . allopurinol  300 mg Oral Daily  . feeding supplement (ENSURE ENLIVE)  237 mL Oral TID BM  . heparin subcutaneous  5,000 Units Subcutaneous Q8H  . pantoprazole  40 mg Oral Daily  . predniSONE  60 mg Oral Q breakfast  . Tbo-filgastrim (GRANIX) SQ  300 mcg Subcutaneous q1800   Continuous Infusions: . sodium chloride 75 mL/hr at 04/03/17 1504  . sodium chloride    . sodium chloride    .  ceFAZolin (ANCEF) IV 2 g (04/07/17 1508)  . famotidine    . lactated ringers Stopped (04/03/17 0700)     LOS: 11 days    Time spent: > 15 minutes    Velvet Bathe, MD [facility] Pager [phone]  If 7PM-7AM, please contact night-coverage [email or web address] Password [facility] 04/07/2017, 3:13 PM

## 2017-04-08 DIAGNOSIS — C8588 Other specified types of non-Hodgkin lymphoma, lymph nodes of multiple sites: Secondary | ICD-10-CM

## 2017-04-08 DIAGNOSIS — Z5111 Encounter for antineoplastic chemotherapy: Secondary | ICD-10-CM

## 2017-04-08 DIAGNOSIS — E871 Hypo-osmolality and hyponatremia: Secondary | ICD-10-CM

## 2017-04-08 LAB — HEPATITIS B CORE ANTIBODY, IGM: Hep B C IgM: NEGATIVE

## 2017-04-08 NOTE — Progress Notes (Signed)
PROGRESS NOTE    Tyler [name]  VQQ:[id number] DOB: Sep 03, 1950 DOA: 03/27/2017 PCP: Andreas Blower, MD    Brief Narrative:  66 y/o with hypercalcemia who was found to have a new diagnosis of non hodgkin lymphoma. 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 - Will continue supportive therapy. - port placed by IR. Plan outlined by Dr. Irene Limbo with oncology:  Will start with Neupogen daily after 24 hours of chemotherapy for 5 days or while in the hospital since we cannot do Neulasta in the hospital. -He will need to be discharged on his supportive medications including antiemetics and prednisone. 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 -Port to be placed soon (ordered)  Principal Problem:   Hypercalcemia - resolved after correction accounting for hypoalbuminemia  Active Problems:   Generalized weakness - Pt to continue PT services     History of traumatic brain injury (1976)   Acute Kidney Injury (AKI) - resolved    Normocytic anemia - stable no active bleeding    Pressure injury of skin   DVT prophylaxis: Heparin Code Status: Full Family Communication: none at bedside. Disposition Plan:  Once cleared for discharge by oncologist.   Consultants:   Oncology  Radiation oncology   Procedures: biopsy   Antimicrobials: none   Subjective: No new problems reported to me.  Objective: Vitals:   04/07/17 1719 04/07/17 1754 04/07/17 2023 04/08/17 0501  BP: 128/84 110/77 125/82 131/71  Pulse:  (!) 102 100 92 98  Resp: 18 18 18 17   Temp: (!) 97.5 F (36.4 C) 97.7 F (36.5 C) 98.3 F (36.8 C) 98.4 F (36.9 C)  TempSrc: Axillary Oral Oral Oral  SpO2: 97% 99% 96% 99%  Weight:      Height:        Intake/Output Summary (Last 24 hours) at 04/08/17 1319 Last data filed at 04/08/17 1154  Gross per 24 hour  Intake           1757.5 ml  Output             1700 ml  Net             57.5 ml   Filed Weights   04/04/17 2108 04/05/17 0021 04/05/17 0922  Weight: 75 kg (165 lb 5.5 oz) 79 kg (174 lb 2.6 oz) 75.2 kg (165 lb 12.8 oz)    Examination:Exam unchanged compared to 04/07/2017  General exam: Appears calm and comfortable, in nad. Respiratory system: Clear to auscultation. Respiratory effort normal. Cardiovascular system: S1 & S2 heard, RRR. No JVD, murmurs, rubs, gallops or clicks. No pedal edema. Gastrointestinal system: Abdomen is nondistended, soft and nontender.  Central nervous system: Alert and oriented. No focal neurological deficits. Extremities: Symmetric 5 x 5 power Skin: No rashes, lesions, warm and dry Psychiatry: Mood & affect appropriate.   Data Reviewed: I have personally reviewed following labs and imaging studies  CBC:  Recent Labs Lab 04/03/17 0236 04/04/17 0808 04/06/17 1553 04/07/17 0350  WBC 12.2* 16.5* 13.3* 12.5*  NEUTROABS  --  14.2* 10.6* 11.2*  HGB 9.0* 10.4* 11.2* 11.3*  HCT 27.7* 31.4* 33.9* 32.6*  MCV 89.4 88.5 91.4 90.1  PLT 387 478* 428* 213   Basic Metabolic Panel:  Recent Labs Lab 04/03/17 0236 04/04/17 0552 04/06/17 1553 04/07/17 0350  NA 133* 132* 133* 130*  K 3.8 3.7 4.3 4.3  CL 106 103 100* 102  CO2 24 25 29 24   GLUCOSE 133* 116* 131* 123*  BUN 22* 19 14 14   CREATININE 1.03 0.86 1.03 0.87  CALCIUM 7.5* 7.5* 8.0* 7.6*  MG  --   --  1.6*  --    GFR: Estimated Creatinine Clearance: 87.6 mL/min (by C-G formula based on SCr of 0.87 mg/dL). Liver Function Tests:  Recent Labs Lab 04/03/17 0236 04/04/17 0552  04/06/17 1553  AST 35 44* 23  ALT 63 83* 54  ALKPHOS 89 88 91  BILITOT 0.6 0.6 0.7  PROT 4.8* 4.6* 5.2*  ALBUMIN 2.0* 2.1* 2.4*   No results for input(s): LIPASE, AMYLASE in the last 168 hours. No results for input(s): AMMONIA in the last 168 hours. Coagulation Profile:  Recent Labs Lab 04/07/17 0350  INR 0.99   Cardiac Enzymes: No results for input(s): CKTOTAL, CKMB, CKMBINDEX, TROPONINI in the last 168 hours. BNP (last 3 results) No results for input(s): PROBNP in the last 8760 hours. HbA1C: No results for input(s): HGBA1C in the last 72 hours. CBG: No results for input(s): GLUCAP in the last 168 hours. Lipid Profile: No results for input(s): CHOL, HDL, LDLCALC, TRIG, CHOLHDL, LDLDIRECT in the last 72 hours. Thyroid Function Tests: No results for input(s): TSH, T4TOTAL, FREET4, T3FREE, THYROIDAB in the last 72 hours. Anemia Panel: No results for input(s): VITAMINB12, FOLATE, FERRITIN, TIBC, IRON, RETICCTPCT in the last 72 hours. Sepsis Labs: No results for input(s): PROCALCITON, LATICACIDVEN in the last 168 hours.  No results found for this or any previous visit (from the past 240 hour(s)).       Radiology Studies: Ir US Guide Vasc Access Right  Result Date: 04/07/2017 INDICATION: 66 year old male with high-grade non-Hodgkin's B-cell lymphoma. He presents for portacatheter placement. EXAM: IMPLANTED PORT A CATH PLACEMENT WITH ULTRASOUND AND FLUOROSCOPIC GUIDANCE MEDICATIONS: 2 g Ancef; The antibiotic was administered within an appropriate time interval prior to skin puncture. ANESTHESIA/SEDATION: Versed 2 mg IV; Fentanyl 100 mcg IV; Moderate Sedation Time:  23 minutes The patient was continuously monitored during the procedure by the interventional radiology nurse under my direct supervision. FLUOROSCOPY TIME:  0 minutes, 6 seconds (1 mGy) COMPLICATIONS: None immediate. PROCEDURE: The right neck and chest was prepped with chlorhexidine, and draped in the usual sterile  fashion using maximum barrier technique (cap and mask, sterile gown, sterile gloves, large sterile sheet, hand hygiene and cutaneous antiseptic). Antibiotic prophylaxis was provided with 2g Ancef administered IV one hour prior to skin incision. Local anesthesia was attained by infiltration with 1% lidocaine with epinephrine. Ultrasound demonstrated patency of the right internal jugular vein, and this was documented with an image. Under real-time ultrasound guidance, this vein was accessed with a 21 gauge micropuncture needle and image documentation was performed. A small dermatotomy was made at the access site with an 11 scalpel. A 0.018" wire was advanced into the SVC and the access needle exchanged for a 84F micropuncture vascular sheath. The 0.018" wire was then removed and a 0.035" wire advanced into the IVC. An appropriate location for the subcutaneous reservoir was selected below the clavicle and an incision was made through the skin and underlying soft tissues. The subcutaneous  tissues were then dissected using a combination of blunt and sharp surgical technique and a pocket was formed. A single lumen power injectable portacatheter was then tunneled through the subcutaneous tissues from the pocket to the dermatotomy and the port reservoir placed within the subcutaneous pocket. The venous access site was then serially dilated and a peel away vascular sheath placed over the wire. The wire was removed and the port catheter advanced into position under fluoroscopic guidance. The catheter tip is positioned in the superior cavoatrial junction. This was documented with a spot image. The portacatheter was then tested and found to flush and aspirate well. The port was flushed with saline followed by 100 units/mL heparinized saline. The pocket was then closed in two layers using first subdermal inverted interrupted absorbable sutures followed by a running subcuticular suture. The epidermis was then sealed with  Dermabond. The dermatotomy at the venous access site was also closed with a single inverted subdermal suture and the epidermis sealed with Dermabond. IMPRESSION: Successful placement of a right IJ approach Power Port with ultrasound and fluoroscopic guidance. The catheter is ready for use. Electronically Signed   By: Jacqulynn Cadet M.D.   On: 04/07/2017 17:39   Ir Fluoro Guide Port Insertion Right  Result Date: 04/07/2017 INDICATION: 66 year old male with high-grade non-Hodgkin's B-cell lymphoma. He presents for portacatheter placement. EXAM: IMPLANTED PORT A CATH PLACEMENT WITH ULTRASOUND AND FLUOROSCOPIC GUIDANCE MEDICATIONS: 2 g Ancef; The antibiotic was administered within an appropriate time interval prior to skin puncture. ANESTHESIA/SEDATION: Versed 2 mg IV; Fentanyl 100 mcg IV; Moderate Sedation Time:  23 minutes The patient was continuously monitored during the procedure by the interventional radiology nurse under my direct supervision. FLUOROSCOPY TIME:  0 minutes, 6 seconds (1 mGy) COMPLICATIONS: None immediate. PROCEDURE: The right neck and chest was prepped with chlorhexidine, and draped in the usual sterile fashion using maximum barrier technique (cap and mask, sterile gown, sterile gloves, large sterile sheet, hand hygiene and cutaneous antiseptic). Antibiotic prophylaxis was provided with 2g Ancef administered IV one hour prior to skin incision. Local anesthesia was attained by infiltration with 1% lidocaine with epinephrine. Ultrasound demonstrated patency of the right internal jugular vein, and this was documented with an image. Under real-time ultrasound guidance, this vein was accessed with a 21 gauge micropuncture needle and image documentation was performed. A small dermatotomy was made at the access site with an 11 scalpel. A 0.018" wire was advanced into the SVC and the access needle exchanged for a 32F micropuncture vascular sheath. The 0.018" wire was then removed and a 0.035" wire  advanced into the IVC. An appropriate location for the subcutaneous reservoir was selected below the clavicle and an incision was made through the skin and underlying soft tissues. The subcutaneous tissues were then dissected using a combination of blunt and sharp surgical technique and a pocket was formed. A single lumen power injectable portacatheter was then tunneled through the subcutaneous tissues from the pocket to the dermatotomy and the port reservoir placed within the subcutaneous pocket. The venous access site was then serially dilated and a peel away vascular sheath placed over the wire. The wire was removed and the port catheter advanced into position under fluoroscopic guidance. The catheter tip is positioned in the superior cavoatrial junction. This was documented with a spot image. The portacatheter was then tested and found to flush and aspirate well. The port was flushed with saline followed by 100 units/mL heparinized saline. The pocket was then closed in two layers using  first subdermal inverted interrupted absorbable sutures followed by a running subcuticular suture. The epidermis was then sealed with Dermabond. The dermatotomy at the venous access site was also closed with a single inverted subdermal suture and the epidermis sealed with Dermabond. IMPRESSION: Successful placement of a right IJ approach Power Port with ultrasound and fluoroscopic guidance. The catheter is ready for use. Electronically Signed   By: Jacqulynn Cadet M.D.   On: 04/07/2017 17:39        Scheduled Meds: . allopurinol  300 mg Oral Daily  . feeding supplement (ENSURE ENLIVE)  237 mL Oral TID BM  . heparin subcutaneous  5,000 Units Subcutaneous Q8H  . pantoprazole  40 mg Oral Daily  . predniSONE  60 mg Oral Q breakfast  . Tbo-filgastrim (GRANIX) SQ  300 mcg Subcutaneous q1800   Continuous Infusions: . sodium chloride 75 mL/hr at 04/03/17 1504  . sodium chloride    . sodium chloride    . famotidine      . lactated ringers Stopped (04/03/17 0700)     LOS: 12 days    Time spent: > 15 minutes    Velvet Bathe, MD [facility] Pager [phone]  If 7PM-7AM, please contact night-coverage [email or web address] Password [email or web address] 04/08/2017, 1:19 PM

## 2017-04-08 NOTE — Progress Notes (Signed)
MD, patient is getting agitated receiving heparin shots q8h.  Could we switch this order to lovenox q24, so that he will have sq shots less often. He became agitated and did not want heparin shot last night despite numerous attempts. He urged that he already had a shot just a few hours earlier.Roderick Pee

## 2017-04-08 NOTE — Progress Notes (Signed)
Late entry for 04/06/2017 at 1430. Chemotherapy dosage and calculations checked and verified with Jonelle Sidle RN.

## 2017-04-08 NOTE — Progress Notes (Signed)
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 No overt evidence of tumor lysis syndrome. -We'll recheck labs tomorrow including uric acid and phosphorus -Continue daily Neupogen as ordered for 5-7 days while in the hospital. --We will continue to have pathology to review his case to define his molecular subtypes. FISH for BCL-2, BCL6 and cMYC requested to r/o double hit lymphoma -He will need to be discharged on his supportive medications including antiemetics and prednisone. -Prednisone 60 mg daily for additional 3 days with GI prophylaxis - If molecular markers suggest double hit large cell lymphoma would consider switching to R-EPOCH instead From his next cycle  -Dietitian consultation to optimize nutritional status. -PTOT evaluation  -We will likely need placement in acute rehab vs SNF -Port placed yesterday - no uncontrolled pain  #2 ARF- resolved  Creatinine down to 0.87 #3 Abnormal LFTs - resolved - likely from lymphoma #4 Hyponatremia - ?decreased solute intake  PLan -evaluation and mx of hyponatremia per hospitalist.  I  spent 25 minutes counseling the patient face to face. The total time spent in the appointment was 35 minutes and more than 50% was on counseling and direct patient cares.  [name] MD [place] AAHIVMS [facility] Hematology/Oncology Physician [facility]  (Office):       [phone] (Work cell):  [phone] (Fax):           [phone]  04/08/2017 3:18 PM

## 2017-04-08 NOTE — Progress Notes (Signed)
Doxorubicin administered to patient through left forearm peripheral IV, blood return noted at beginning of administration, during and after. Patient tolerated well, will continue to monitor.

## 2017-04-08 NOTE — Progress Notes (Signed)
Patient's uncle called and was updated re: status.

## 2017-04-08 NOTE — Progress Notes (Signed)
L neck dressing changed, gauze applied per order.site dry and intact.

## 2017-04-09 DIAGNOSIS — D72829 Elevated white blood cell count, unspecified: Secondary | ICD-10-CM

## 2017-04-09 LAB — COMPREHENSIVE METABOLIC PANEL
ALK PHOS: 81 U/L (ref 38–126)
ALT: 29 U/L (ref 17–63)
ANION GAP: 5 (ref 5–15)
AST: 16 U/L (ref 15–41)
Albumin: 2.5 g/dL — ABNORMAL LOW (ref 3.5–5.0)
BILIRUBIN TOTAL: 0.8 mg/dL (ref 0.3–1.2)
BUN: 18 mg/dL (ref 6–20)
CO2: 25 mmol/L (ref 22–32)
Calcium: 7.6 mg/dL — ABNORMAL LOW (ref 8.9–10.3)
Chloride: 103 mmol/L (ref 101–111)
Creatinine, Ser: 0.72 mg/dL (ref 0.61–1.24)
GFR calc non Af Amer: >60 mL/min (ref >60)
Glucose, Bld: 91 mg/dL (ref 65–99)
Potassium: 3.9 mmol/L (ref 3.5–5.1)
Sodium: 133 mmol/L — ABNORMAL LOW (ref 135–145)
TOTAL PROTEIN: 4.9 g/dL — AB (ref 6.5–8.1)

## 2017-04-09 LAB — CBC WITH DIFFERENTIAL/PLATELET
BASOS PCT: 0 %
Basophils Absolute: 0 10*3/uL (ref 0.0–0.1)
EOS ABS: 0 10*3/uL (ref 0.0–0.7)
Eosinophils Relative: 0 %
HEMATOCRIT: 28.9 % — AB (ref 39.0–52.0)
HEMOGLOBIN: 9.9 g/dL — AB (ref 13.0–17.0)
Lymphocytes Relative: 1 %
Lymphs Abs: 0.8 10*3/uL (ref 0.7–4.0)
MCH: 31.1 pg (ref 26.0–34.0)
MCHC: 34.3 g/dL (ref 30.0–36.0)
MCV: 90.9 fL (ref 78.0–100.0)
MONOS PCT: 2 %
Monocytes Absolute: 1.6 10*3/uL — ABNORMAL HIGH (ref 0.1–1.0)
NEUTROS ABS: 77.7 10*3/uL — AB (ref 1.7–7.7)
Neutrophils Relative %: 97 %
Platelets: 330 10*3/uL (ref 150–400)
RBC: 3.18 MIL/uL — ABNORMAL LOW (ref 4.22–5.81)
RDW: 17.2 % — ABNORMAL HIGH (ref 11.5–15.5)
WBC: 80.1 10*3/uL (ref 4.0–10.5)

## 2017-04-09 LAB — PHOSPHORUS: Phosphorus: 1.7 mg/dL — ABNORMAL LOW (ref 2.5–4.6)

## 2017-04-09 LAB — URIC ACID: URIC ACID, SERUM: 2.5 mg/dL — AB (ref 4.4–7.6)

## 2017-04-09 MED ORDER — SODIUM CHLORIDE 1 G PO TABS
2.0000 g | ORAL_TABLET | Freq: Once | ORAL | Status: AC
  Administered 2017-04-09: 2 g via ORAL
  Filled 2017-04-09: qty 2

## 2017-04-09 MED ORDER — K PHOS MONO-SOD PHOS DI & MONO 155-852-130 MG PO TABS
250.0000 mg | ORAL_TABLET | Freq: Two times a day (BID) | ORAL | Status: AC
  Administered 2017-04-09 – 2017-04-10 (×4): 250 mg via ORAL
  Filled 2017-04-09 (×5): qty 1

## 2017-04-09 NOTE — Progress Notes (Signed)
PROGRESS NOTE    Tyler [name]  VOJ:[id number] DOB: [birth year] DOA: 03/27/2017 PCP: Andreas Blower, MD    Brief Narrative:  66 y/o with hypercalcemia who was found to have a new diagnosis of non hodgkin lymphoma. Pt transferred to [facility] long hospital for further treatment options from oncological services   Assessment & Plan:     Neck mass/ Adenopathy/ Retroperitoneal lymphadenopathy/  Large cell lymphoma [facility]) - oncology and radiation oncology on board and managing. - Will continue supportive therapy. - port placed by IR. Plan outlined by Dr. Irene Limbo with oncology:  Plan -No overt prohibitive toxicities from chemotherapy at this time. No overt evidence of tumor lysis syndrome. --Continue daily Neupogen as ordered for 5-7 days while in the hospital. Leucocytosis from granix --monitor -anticipate this will drop as counts nadir from chemotherapy. No symptoms suggestive of infection. If counts keep increasing might hold granix at 5 days. 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 -PTOT evaluation  -We will likely need placement in acute rehab vs SNF - hopefully early this week. -no port related issues. -incentive spirometry  #3 ARF- resolved  Creatinine down to 0.87 #4 Abnormal LFTs - resolved - likely from lymphoma #5 Hyponatremia - ?decreased solute intake . Improving sodium levels up to 133 PLan -evaluation and mx of hyponatremia per hospitalist. #6 Mild hypophosphatemia. No evidence of TLS -neutraphos 1 pkt BID x 2 days -encourage improved po intake.  Hyponatremia - most likely due to poor oral solute intake. - administer salt tablet.  Principal Problem:   Hypercalcemia - resolved after correction accounting for hypoalbuminemia  Active Problems:   Generalized  weakness - Pt to continue PT services     History of traumatic brain injury (1976)   Acute Kidney Injury (AKI) - resolved    Normocytic anemia - stable no active bleeding    Pressure injury of skin   DVT prophylaxis: Heparin Code Status: Full Family Communication: none at bedside. Disposition Plan:  Once cleared for discharge by oncologist.   Consultants:   Oncology  Radiation oncology   Procedures: biopsy   Antimicrobials: none   Subjective: No new problems reported to me.  Objective: Vitals:   04/08/17 1456 04/08/17 2035 04/09/17 0152 04/09/17 0447  BP: 127/78 (!) 141/92  (!) 150/80  Pulse: 95 86  99  Resp: 16 18  18   Temp: 97.9 F (36.6 C) 98 F (36.7 C)  98.3 F (36.8 C)  TempSrc: Oral Oral  Oral  SpO2: 97% 98%  99%  Weight:   74 kg (163 lb 2.3 oz)   Height:        Intake/Output Summary (Last 24 hours) at 04/09/17 1436 Last data filed at 04/09/17 0848  Gross per 24 hour  Intake              360 ml  Output             1400 ml  Net            -1040 ml   Filed Weights   04/05/17 0021 04/05/17 0922 04/09/17 0152  Weight: 79 kg (174 lb 2.6 oz) 75.2 kg (165 lb 12.8 oz) 74 kg (163 lb 2.3 oz)    Examination:Exam unchanged compared to 04/08/2017  General exam: Appears calm and comfortable, in nad. Respiratory system: Clear  to auscultation. Respiratory effort normal. Cardiovascular system: S1 & S2 heard, RRR. No JVD, murmurs, rubs, gallops or clicks. No pedal edema. Gastrointestinal system: Abdomen is nondistended, soft and nontender.  Central nervous system: Alert and oriented. No focal neurological deficits. Extremities: Symmetric 5 x 5 power Skin: No rashes, lesions, warm and dry Psychiatry: Mood & affect appropriate.   Data Reviewed: I have personally reviewed following labs and imaging studies  CBC:  Recent Labs Lab 04/03/17 0236 04/04/17 0808 04/06/17 1553 04/07/17 0350 04/09/17 0518  WBC 12.2* 16.5* 13.3* 12.5* 80.1*  NEUTROABS   --  14.2* 10.6* 11.2* 77.7*  HGB 9.0* 10.4* 11.2* 11.3* 9.9*  HCT 27.7* 31.4* 33.9* 32.6* 28.9*  MCV 89.4 88.5 91.4 90.1 90.9  PLT 387 478* 428* 391 269   Basic Metabolic Panel:  Recent Labs Lab 04/03/17 0236 04/04/17 0552 04/06/17 1553 04/07/17 0350 04/09/17 0518  NA 133* 132* 133* 130* 133*  K 3.8 3.7 4.3 4.3 3.9  CL 106 103 100* 102 103  CO2 24 25 29 24 25   GLUCOSE 133* 116* 131* 123* 91  BUN 22* 19 14 14 18   CREATININE 1.03 0.86 1.03 0.87 0.72  CALCIUM 7.5* 7.5* 8.0* 7.6* 7.6*  MG  --   --  1.6*  --   --   PHOS  --   --   --   --  1.7*   GFR: Estimated Creatinine Clearance: 93.8 mL/min (by C-G formula based on SCr of 0.72 mg/dL). Liver Function Tests:  Recent Labs Lab 04/03/17 0236 04/04/17 0552 04/06/17 1553 04/09/17 0518  AST 35 44* 23 16  ALT 63 83* 54 29  ALKPHOS 89 88 91 81  BILITOT 0.6 0.6 0.7 0.8  PROT 4.8* 4.6* 5.2* 4.9*  ALBUMIN 2.0* 2.1* 2.4* 2.5*   No results for input(s): LIPASE, AMYLASE in the last 168 hours. No results for input(s): AMMONIA in the last 168 hours. Coagulation Profile:  Recent Labs Lab 04/07/17 0350  INR 0.99   Cardiac Enzymes: No results for input(s): CKTOTAL, CKMB, CKMBINDEX, TROPONINI in the last 168 hours. BNP (last 3 results) No results for input(s): PROBNP in the last 8760 hours. HbA1C: No results for input(s): HGBA1C in the last 72 hours. CBG: No results for input(s): GLUCAP in the last 168 hours. Lipid Profile: No results for input(s): CHOL, HDL, LDLCALC, TRIG, CHOLHDL, LDLDIRECT in the last 72 hours. Thyroid Function Tests: No results for input(s): TSH, T4TOTAL, FREET4, T3FREE, THYROIDAB in the last 72 hours. Anemia Panel: No results for input(s): VITAMINB12, FOLATE, FERRITIN, TIBC, IRON, RETICCTPCT in the last 72 hours. Sepsis Labs: No results for input(s): PROCALCITON, LATICACIDVEN in the last 168 hours.  No results found for this or any previous visit (from the past 240 hour(s)).       Radiology  Studies: Ir US Guide Vasc Access Right  Result Date: 04/07/2017 INDICATION: 66 year old male with high-grade non-Hodgkin's B-cell lymphoma. He presents for portacatheter placement. EXAM: IMPLANTED PORT A CATH PLACEMENT WITH ULTRASOUND AND FLUOROSCOPIC GUIDANCE MEDICATIONS: 2 g Ancef; The antibiotic was administered within an appropriate time interval prior to skin puncture. ANESTHESIA/SEDATION: Versed 2 mg IV; Fentanyl 100 mcg IV; Moderate Sedation Time:  23 minutes The patient was continuously monitored during the procedure by the interventional radiology nurse under my direct supervision. FLUOROSCOPY TIME:  0 minutes, 6 seconds (1 mGy) COMPLICATIONS: None immediate. PROCEDURE: The right neck and chest was prepped with chlorhexidine, and draped in the usual sterile fashion using maximum barrier technique (cap and mask,  sterile gown, sterile gloves, large sterile sheet, hand hygiene and cutaneous antiseptic). Antibiotic prophylaxis was provided with 2g Ancef administered IV one hour prior to skin incision. Local anesthesia was attained by infiltration with 1% lidocaine with epinephrine. Ultrasound demonstrated patency of the right internal jugular vein, and this was documented with an image. Under real-time ultrasound guidance, this vein was accessed with a 21 gauge micropuncture needle and image documentation was performed. A small dermatotomy was made at the access site with an 11 scalpel. A 0.018" wire was advanced into the SVC and the access needle exchanged for a 51F micropuncture vascular sheath. The 0.018" wire was then removed and a 0.035" wire advanced into the IVC. An appropriate location for the subcutaneous reservoir was selected below the clavicle and an incision was made through the skin and underlying soft tissues. The subcutaneous tissues were then dissected using a combination of blunt and sharp surgical technique and a pocket was formed. A single lumen power injectable portacatheter was then  tunneled through the subcutaneous tissues from the pocket to the dermatotomy and the port reservoir placed within the subcutaneous pocket. The venous access site was then serially dilated and a peel away vascular sheath placed over the wire. The wire was removed and the port catheter advanced into position under fluoroscopic guidance. The catheter tip is positioned in the superior cavoatrial junction. This was documented with a spot image. The portacatheter was then tested and found to flush and aspirate well. The port was flushed with saline followed by 100 units/mL heparinized saline. The pocket was then closed in two layers using first subdermal inverted interrupted absorbable sutures followed by a running subcuticular suture. The epidermis was then sealed with Dermabond. The dermatotomy at the venous access site was also closed with a single inverted subdermal suture and the epidermis sealed with Dermabond. IMPRESSION: Successful placement of a right IJ approach Power Port with ultrasound and fluoroscopic guidance. The catheter is ready for use. Electronically Signed   By: Jacqulynn Cadet M.D.   On: 04/07/2017 17:39   Ir Fluoro Guide Port Insertion Right  Result Date: 04/07/2017 INDICATION: 66 year old male with high-grade non-Hodgkin's B-cell lymphoma. He presents for portacatheter placement. EXAM: IMPLANTED PORT A CATH PLACEMENT WITH ULTRASOUND AND FLUOROSCOPIC GUIDANCE MEDICATIONS: 2 g Ancef; The antibiotic was administered within an appropriate time interval prior to skin puncture. ANESTHESIA/SEDATION: Versed 2 mg IV; Fentanyl 100 mcg IV; Moderate Sedation Time:  23 minutes The patient was continuously monitored during the procedure by the interventional radiology nurse under my direct supervision. FLUOROSCOPY TIME:  0 minutes, 6 seconds (1 mGy) COMPLICATIONS: None immediate. PROCEDURE: The right neck and chest was prepped with chlorhexidine, and draped in the usual sterile fashion using maximum  barrier technique (cap and mask, sterile gown, sterile gloves, large sterile sheet, hand hygiene and cutaneous antiseptic). Antibiotic prophylaxis was provided with 2g Ancef administered IV one hour prior to skin incision. Local anesthesia was attained by infiltration with 1% lidocaine with epinephrine. Ultrasound demonstrated patency of the right internal jugular vein, and this was documented with an image. Under real-time ultrasound guidance, this vein was accessed with a 21 gauge micropuncture needle and image documentation was performed. A small dermatotomy was made at the access site with an 11 scalpel. A 0.018" wire was advanced into the SVC and the access needle exchanged for a 51F micropuncture vascular sheath. The 0.018" wire was then removed and a 0.035" wire advanced into the IVC. An appropriate location for the subcutaneous reservoir was selected  below the clavicle and an incision was made through the skin and underlying soft tissues. The subcutaneous tissues were then dissected using a combination of blunt and sharp surgical technique and a pocket was formed. A single lumen power injectable portacatheter was then tunneled through the subcutaneous tissues from the pocket to the dermatotomy and the port reservoir placed within the subcutaneous pocket. The venous access site was then serially dilated and a peel away vascular sheath placed over the wire. The wire was removed and the port catheter advanced into position under fluoroscopic guidance. The catheter tip is positioned in the superior cavoatrial junction. This was documented with a spot image. The portacatheter was then tested and found to flush and aspirate well. The port was flushed with saline followed by 100 units/mL heparinized saline. The pocket was then closed in two layers using first subdermal inverted interrupted absorbable sutures followed by a running subcuticular suture. The epidermis was then sealed with Dermabond. The dermatotomy at  the venous access site was also closed with a single inverted subdermal suture and the epidermis sealed with Dermabond. IMPRESSION: Successful placement of a right IJ approach Power Port with ultrasound and fluoroscopic guidance. The catheter is ready for use. Electronically Signed   By: Jacqulynn Cadet M.D.   On: 04/07/2017 17:39        Scheduled Meds: . allopurinol  300 mg Oral Daily  . feeding supplement (ENSURE ENLIVE)  237 mL Oral TID BM  . heparin subcutaneous  5,000 Units Subcutaneous Q8H  . pantoprazole  40 mg Oral Daily  . phosphorus  250 mg Oral BID  . predniSONE  60 mg Oral Q breakfast  . Tbo-filgastrim (GRANIX) SQ  300 mcg Subcutaneous q1800   Continuous Infusions: . sodium chloride 75 mL/hr at 04/03/17 1504  . sodium chloride    . sodium chloride    . famotidine    . lactated ringers Stopped (04/03/17 0700)     LOS: 13 days    Time spent: > 15 minutes    Velvet Bathe, MD [facility] Pager [phone]  If 7PM-7AM, please contact night-coverage [email or web address] Password [facility] 04/09/2017, 2:36 PM

## 2017-04-09 NOTE — Progress Notes (Signed)
CRITICAL VALUE ALERT  Critical Value: WBC 80.1  Date & Time Notied:  04/09/17 at 618am  Provider Notified:Opyd  Orders Received/Actions taken: no orders received. MD was sent detailed message. Pt. Has been receiving Granix doses after chemo.

## 2017-04-09 NOTE — Progress Notes (Signed)
Attempted port dressing change tonight and patient refused. Will attempt again in am. Roderick Pee

## 2017-04-09 NOTE — Progress Notes (Signed)
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  Plan -No overt prohibitive toxicities from chemotherapy at this time. No overt evidence of tumor lysis syndrome. --Continue daily Neupogen as ordered for 5-7 days while in the hospital. Leucocytosis from granix --monitor -anticipate this will drop as counts nadir from chemotherapy. No symptoms suggestive of infection. If counts keep increasing might hold granix at 5 days. 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 -PTOT evaluation  -We will likely need placement in acute rehab vs SNF - hopefully early this week. -no port related issues. -incentive spirometry  #3 ARF- resolved  Creatinine down to 0.87 #4 Abnormal LFTs - resolved - likely from lymphoma #5 Hyponatremia - ?decreased solute intake . Improving sodium levels up to 133 PLan -evaluation and mx of hyponatremia per hospitalist. #6 Mild hypophosphatemia. No evidence of TLS -neutraphos 1 pkt BID x 2  days -encourage improved po intake.   I spent 20 minutes counseling the patient face to face. The total time spent in the appointment was 30 minutes and more than 50% was on counseling and direct patient cares.  [name] MD [place] AAHIVMS [facility] Hematology/Oncology Physician [facility]  (Office):       [phone] (Work cell):  [phone] (Fax):           [phone]  04/09/2017 12:00 PM

## 2017-04-10 LAB — CBC
HCT: 29 % — ABNORMAL LOW (ref 39.0–52.0)
Hemoglobin: 10 g/dL — ABNORMAL LOW (ref 13.0–17.0)
MCH: 32.2 pg (ref 26.0–34.0)
MCHC: 34.5 g/dL (ref 30.0–36.0)
MCV: 93.2 fL (ref 78.0–100.0)
PLATELETS: 285 10*3/uL (ref 150–400)
RBC: 3.11 MIL/uL — AB (ref 4.22–5.81)
RDW: 17.5 % — AB (ref 11.5–15.5)
WBC: 88.2 10*3/uL — AB (ref 4.0–10.5)

## 2017-04-10 LAB — BASIC METABOLIC PANEL
Anion gap: 7 (ref 5–15)
BUN: 16 mg/dL (ref 6–20)
CALCIUM: 8.1 mg/dL — AB (ref 8.9–10.3)
CO2: 26 mmol/L (ref 22–32)
CREATININE: 0.66 mg/dL (ref 0.61–1.24)
Chloride: 100 mmol/L — ABNORMAL LOW (ref 101–111)
Glucose, Bld: 89 mg/dL (ref 65–99)
Potassium: 4.2 mmol/L (ref 3.5–5.1)
SODIUM: 133 mmol/L — AB (ref 135–145)

## 2017-04-10 NOTE — Progress Notes (Signed)
PROGRESS NOTE    Tyler [name]  GNF:[id number] DOB: 1951/03/30 DOA: 03/27/2017 PCP: Andreas Blower, MD    Brief Narrative:  66 y/o with hypercalcemia who was found to have a new diagnosis of non hodgkin lymphoma. Pt transferred to [facility] long hospital for further treatment options from oncological services   Assessment & Plan:     Neck mass/ Adenopathy/ Retroperitoneal lymphadenopathy/  Large cell lymphoma [facility]) - oncology and radiation oncology on board and managing. - Will continue supportive therapy. - port placed by IR. Plan outlined by Dr. Irene Limbo with oncology:  Plan -No overt prohibitive toxicities from chemotherapy at this time. No overt evidence of tumor lysis syndrome. --Continue daily Neupogen as ordered for 5-7 days while in the hospital. Leucocytosis from granix --monitor -anticipate this will drop as counts nadir from chemotherapy. No symptoms suggestive of infection. If counts keep increasing might hold granix at 5 days. 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 -PTOT evaluation  -We will likely need placement in acute rehab vs SNF - hopefully early this week. -no port related issues. -incentive spirometry  #3 ARF- resolved  Creatinine down to 0.866 #4 Abnormal LFTs - resolved - likely from lymphoma #5 Hyponatremia - ?decreased solute intake . Improving sodium levels up to 133 PLan -evaluation and mx of hyponatremia per hospitalist. #6 Mild hypophosphatemia. No evidence of TLS -neutraphos 1 pkt BID x 2 days -encourage improved po intake.  Hyponatremia - most likely due to poor oral solute intake. - stable currently patient is asymptomatic.  Principal Problem:   Hypercalcemia - resolved after correction accounting for hypoalbuminemia  Active  Problems:   Generalized weakness - Pt to continue PT services     History of traumatic brain injury (1976)   Acute Kidney Injury (AKI) - resolved    Normocytic anemia - stable no active bleeding    Pressure injury of skin   DVT prophylaxis: Heparin Code Status: Full Family Communication: none at bedside. Disposition Plan:  Once cleared for discharge by oncologist.   Consultants:   Oncology  Radiation oncology   Procedures: biopsy   Antimicrobials: none   Subjective: No acute issues reported overnight.  Objective: Vitals:   04/09/17 2036 04/10/17 0120 04/10/17 0437 04/10/17 1308  BP: 132/64  (!) 141/64 132/72  Pulse: 77  72 94  Resp: 18  18 16   Temp: 98.8 F (37.1 C)  98.6 F (37 C) 98.2 F (36.8 C)  TempSrc: Oral  Oral Oral  SpO2: 99%  98% 98%  Weight:  73.9 kg (162 lb 14.7 oz)    Height:        Intake/Output Summary (Last 24 hours) at 04/10/17 1532 Last data filed at 04/10/17 1300  Gross per 24 hour  Intake             1200 ml  Output             2500 ml  Net            -1300 ml   Filed Weights   04/05/17 0922 04/09/17 0152 04/10/17 0120  Weight: 75.2 kg (165 lb 12.8 oz) 74 kg (163 lb 2.3 oz) 73.9 kg (162 lb 14.7 oz)    Examination:Exam unchanged compared to 04/09/2017  General exam: Appears calm and comfortable, in nad. Respiratory system: Clear to  auscultation. Respiratory effort normal. Cardiovascular system: S1 & S2 heard, RRR. No JVD, murmurs, rubs, gallops or clicks. No pedal edema. Gastrointestinal system: Abdomen is nondistended, soft and nontender.  Central nervous system: Alert and oriented. No focal neurological deficits. Extremities: Symmetric 5 x 5 power Skin: No rashes, lesions, warm and dry Psychiatry: Mood & affect appropriate.   Data Reviewed: I have personally reviewed following labs and imaging studies  CBC:  Recent Labs Lab 04/04/17 0808 04/06/17 1553 04/07/17 0350 04/09/17 0518 04/10/17 0520  WBC 16.5* 13.3*  12.5* 80.1* 88.2*  NEUTROABS 14.2* 10.6* 11.2* 77.7*  --   HGB 10.4* 11.2* 11.3* 9.9* 10.0*  HCT 31.4* 33.9* 32.6* 28.9* 29.0*  MCV 88.5 91.4 90.1 90.9 93.2  PLT 478* 428* 391 330 093   Basic Metabolic Panel:  Recent Labs Lab 04/04/17 0552 04/06/17 1553 04/07/17 0350 04/09/17 0518 04/10/17 0520  NA 132* 133* 130* 133* 133*  K 3.7 4.3 4.3 3.9 4.2  CL 103 100* 102 103 100*  CO2 25 29 24 25 26   GLUCOSE 116* 131* 123* 91 89  BUN 19 14 14 18 16   CREATININE 0.86 1.03 0.87 0.72 0.66  CALCIUM 7.5* 8.0* 7.6* 7.6* 8.1*  MG  --  1.6*  --   --   --   PHOS  --   --   --  1.7*  --    GFR: Estimated Creatinine Clearance: 93.7 mL/min (by C-G formula based on SCr of 0.66 mg/dL). Liver Function Tests:  Recent Labs Lab 04/04/17 0552 04/06/17 1553 04/09/17 0518  AST 44* 23 16  ALT 83* 54 29  ALKPHOS 88 91 81  BILITOT 0.6 0.7 0.8  PROT 4.6* 5.2* 4.9*  ALBUMIN 2.1* 2.4* 2.5*   No results for input(s): LIPASE, AMYLASE in the last 168 hours. No results for input(s): AMMONIA in the last 168 hours. Coagulation Profile:  Recent Labs Lab 04/07/17 0350  INR 0.99   Cardiac Enzymes: No results for input(s): CKTOTAL, CKMB, CKMBINDEX, TROPONINI in the last 168 hours. BNP (last 3 results) No results for input(s): PROBNP in the last 8760 hours. HbA1C: No results for input(s): HGBA1C in the last 72 hours. CBG: No results for input(s): GLUCAP in the last 168 hours. Lipid Profile: No results for input(s): CHOL, HDL, LDLCALC, TRIG, CHOLHDL, LDLDIRECT in the last 72 hours. Thyroid Function Tests: No results for input(s): TSH, T4TOTAL, FREET4, T3FREE, THYROIDAB in the last 72 hours. Anemia Panel: No results for input(s): VITAMINB12, FOLATE, FERRITIN, TIBC, IRON, RETICCTPCT in the last 72 hours. Sepsis Labs: No results for input(s): PROCALCITON, LATICACIDVEN in the last 168 hours.  No results found for this or any previous visit (from the past 240 hour(s)).       Radiology  Studies: No results found.      Scheduled Meds: . allopurinol  300 mg Oral Daily  . feeding supplement (ENSURE ENLIVE)  237 mL Oral TID BM  . heparin subcutaneous  5,000 Units Subcutaneous Q8H  . pantoprazole  40 mg Oral Daily  . phosphorus  250 mg Oral BID  . Tbo-filgastrim (GRANIX) SQ  300 mcg Subcutaneous q1800   Continuous Infusions: . sodium chloride 75 mL/hr at 04/03/17 1504  . sodium chloride    . sodium chloride    . famotidine    . lactated ringers Stopped (04/03/17 0700)     LOS: 14 days    Time spent: > 15 minutes    Velvet Bathe, MD [facility] Pager [phone]  If  7PM-7AM, please contact night-coverage [email or web address] Password [facility] 04/10/2017, 3:32 PM

## 2017-04-10 NOTE — Progress Notes (Signed)
Nutrition Follow-up  DOCUMENTATION CODES:   Severe malnutrition in context of chronic illness  INTERVENTION:   -Continue Ensure Enlive po BID, each supplement provides 350 kcal and 20 grams of protein (mixed with ice cream). -Continue Magic cup BID with meals, each supplement provides 290 kcal and 9 grams of protein -RD will continue to monitor  NUTRITION DIAGNOSIS:   Malnutrition (Severe) related to chronic illness (suspected malignancy with hypercalcemia, wt loss, dysphagia, weakness) as evidenced by severe depletion of body fat, severe depletion of muscle mass.  Ongoing.  GOAL:   Patient will meet greater than or equal to 90% of their needs  Progressing.  MONITOR:   PO intake, Supplement acceptance, Labs, Weight trends  REASON FOR ASSESSMENT:   Consult Assessment of nutrition requirement/status  ASSESSMENT:   66 yo male admitted with generalized weakness, dysphagia and w tloss of 1 month duration, hypercalcemia. Pt with hx of TBI over 40 years ago  Patient currently eating 100% of meals, with some seconds of meals recorded. Over the past 24 hours, pt has consumed ~2200 kcal and 78g protein via meals. Not consistently drinking Ensure supplements, which family reported to RD that he may not accept if he knows it is Ensure. Recommended in order that Ensure be mixed with ice cream and given in a separate cup.   Medications: Protonix tablet daily, K-PHOS tablet BID Labs reviewed: Low Na, Phos, Mg  Diet Order:  Diet regular Room service appropriate? Yes; Fluid consistency: Thin  Skin:  Wound (see comment) (stage II buttock)  Last BM:  9/14  Height:   Ht Readings from Last 1 Encounters:  04/05/17 6' (1.829 m)    Weight:   Wt Readings from Last 1 Encounters:  04/10/17 162 lb 14.7 oz (73.9 kg)    Ideal Body Weight:   80.9 kg  BMI:  Body mass index is 22.1 kg/m.  Estimated Nutritional Needs:   Kcal:  1900-2100 kcals  Protein:  95-105 g  Fluid:  >/=  1.9 L  EDUCATION NEEDS:   No education needs identified at this time  Clayton Bibles, MS, RD, LDN Pager: [phone] After Hours Pager: [phone]

## 2017-04-10 NOTE — Progress Notes (Signed)
Physical Therapy Treatment Patient Details Name: Tyler [name] MRN: [id number] DOB: 25-Oct-1950 Today's Date: 04/10/2017    History of Present Illness 66 y.o. 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  Continue to recommend SNF upon d/c.  Follow Up Recommendations  SNF     Equipment Recommendations  Rolling walker with 5" wheels    Recommendations for Other Services       Precautions / Restrictions Precautions Precautions: Fall Precaution Comments: nursing reports urinary urgency    Mobility  Bed Mobility Overal bed mobility: Needs Assistance Bed Mobility: Supine to Sit;Sit to Supine     Supine to sit: Min guard;HOB elevated Sit to supine: Min guard;HOB elevated   General bed mobility comments: verbal cues for completing mobility; min/guard for safety  Transfers Overall transfer level: Needs assistance Equipment used: Rolling walker (2 wheeled) Transfers: Sit to/from Stand Sit to Stand: Min assist         General transfer comment: assist to rise and steady, verbal cues for hand placement and use of RW  Ambulation/Gait Ambulation/Gait assistance: Min assist Ambulation Distance (Feet): 110 Feet Assistive device: Rolling walker (2 wheeled) Gait Pattern/deviations: Step-through pattern;Decreased stride length     General Gait Details: initial assist required for steadying which improved with distance however pt reports feeling "tired"; distance to tolerance, increased multimodal cues for positioning within RW (pt positions RW too far forward)   Science writer    Modified  Rankin (Stroke Patients Only)       Balance Overall balance assessment: Needs assistance         Standing balance support: Bilateral upper extremity supported Standing balance-Leahy Scale: Poor Standing balance comment: requires UE support                            Cognition Arousal/Alertness: Awake/alert Behavior During Therapy: WFL for tasks assessed/performed Overall Cognitive Status: History of cognitive impairments - at baseline                                 General Comments: requires safety cues, believes he is in [name] - attempted to reorient to [name] however pt disagreed      Exercises      General Comments        Pertinent Vitals/Pain Pain Assessment: Faces (does not appear in distress or pain) Faces Pain Scale: No hurt    Home Living                      Prior Function            PT Goals (current goals can now be found in the care plan section) Acute Rehab PT Goals PT Goal Formulation: Patient unable to participate in goal setting Time For Goal Achievement: 04/24/17 Potential to Achieve Goals: Good Progress towards PT goals: Progressing toward goals    Frequency    Min 2X/week      PT Plan Current  plan remains appropriate    Co-evaluation              AM-PAC PT "6 Clicks" Daily Activity  Outcome Measure  Difficulty turning over in bed (including adjusting bedclothes, sheets and blankets)?: A Little Difficulty moving from lying on back to sitting on the side of the bed? : A Little Difficulty sitting down on and standing up from a chair with arms (e.g., wheelchair, bedside commode, etc,.)?: Unable Help needed moving to and from a bed to chair (including a wheelchair)?: A Little Help needed walking in hospital room?: A Little Help needed climbing 3-5 steps with a railing? : A Little 6 Click Score: 16    End of Session Equipment Utilized During Treatment: Gait belt Activity Tolerance:  Patient tolerated treatment well Patient left: in bed;with call bell/phone within reach;with bed alarm set  Telesitter present and aware of pt back in bed.   PT Visit Diagnosis: Muscle weakness (generalized) (M62.81);Difficulty in walking, not elsewhere classified (R26.2)     Time: 6063-0160 PT Time Calculation (min) (ACUTE ONLY): 14 min  Charges:  $Gait Training: 8-22 mins                    G Codes:      Carmelia Bake, PT, DPT 04/10/2017 Pager: [phone]  [name] 04/10/2017, 12:49 PM

## 2017-04-10 NOTE — Progress Notes (Signed)
CSW following to assist with planned transition to SNF at time of DC.  Pt/family have selected [facility]- CSW selected in [place] and spoke with admissions who will initiate [facility] insurance pre-authorization. Family states they can assist with having pt consider paying out-of pocket should there be an issue with obtaining insurance authorization.  Per oncology team, pt will receive chemotherapy every 3 weeks (Frist received 04/06/17). CSW informed Helene Kelp for care planning purposes.   Sharren Bridge, MSW, LCSW Clinical Social Work 04/10/2017 [phone]

## 2017-04-11 ENCOUNTER — Other Ambulatory Visit: Payer: Self-pay | Admitting: Hematology

## 2017-04-11 DIAGNOSIS — N179 Acute kidney failure, unspecified: Secondary | ICD-10-CM | POA: Diagnosis not present

## 2017-04-11 DIAGNOSIS — R1311 Dysphagia, oral phase: Secondary | ICD-10-CM | POA: Diagnosis not present

## 2017-04-11 DIAGNOSIS — M6281 Muscle weakness (generalized): Secondary | ICD-10-CM | POA: Diagnosis not present

## 2017-04-11 DIAGNOSIS — C8250 Diffuse follicle center lymphoma, unspecified site: Secondary | ICD-10-CM | POA: Diagnosis not present

## 2017-04-11 DIAGNOSIS — R488 Other symbolic dysfunctions: Secondary | ICD-10-CM | POA: Diagnosis not present

## 2017-04-11 DIAGNOSIS — E279 Disorder of adrenal gland, unspecified: Secondary | ICD-10-CM | POA: Diagnosis not present

## 2017-04-11 DIAGNOSIS — R4182 Altered mental status, unspecified: Secondary | ICD-10-CM | POA: Diagnosis not present

## 2017-04-11 DIAGNOSIS — N1330 Unspecified hydronephrosis: Secondary | ICD-10-CM | POA: Diagnosis not present

## 2017-04-11 DIAGNOSIS — C8588 Other specified types of non-Hodgkin lymphoma, lymph nodes of multiple sites: Secondary | ICD-10-CM | POA: Diagnosis not present

## 2017-04-11 DIAGNOSIS — Z5111 Encounter for antineoplastic chemotherapy: Secondary | ICD-10-CM | POA: Diagnosis not present

## 2017-04-11 DIAGNOSIS — C8518 Unspecified B-cell lymphoma, lymph nodes of multiple sites: Secondary | ICD-10-CM | POA: Diagnosis not present

## 2017-04-11 DIAGNOSIS — Z87820 Personal history of traumatic brain injury: Secondary | ICD-10-CM | POA: Diagnosis not present

## 2017-04-11 DIAGNOSIS — D72829 Elevated white blood cell count, unspecified: Secondary | ICD-10-CM | POA: Diagnosis not present

## 2017-04-11 DIAGNOSIS — R2681 Unsteadiness on feet: Secondary | ICD-10-CM | POA: Diagnosis not present

## 2017-04-11 DIAGNOSIS — C8338 Diffuse large B-cell lymphoma, lymph nodes of multiple sites: Secondary | ICD-10-CM

## 2017-04-11 DIAGNOSIS — E871 Hypo-osmolality and hyponatremia: Secondary | ICD-10-CM | POA: Diagnosis not present

## 2017-04-11 DIAGNOSIS — Z95828 Presence of other vascular implants and grafts: Secondary | ICD-10-CM | POA: Diagnosis not present

## 2017-04-11 MED ORDER — PREDNISONE 20 MG PO TABS: 20.0000 mg | ORAL_TABLET | ORAL | 0 refills | Status: DC

## 2017-04-11 MED ORDER — LORAZEPAM 0.5 MG PO TABS: 0.5000 mg | ORAL_TABLET | Freq: Four times a day (QID) | ORAL | 0 refills | Status: DC | PRN

## 2017-04-11 MED ORDER — ALLOPURINOL 300 MG PO TABS: 300.0000 mg | ORAL_TABLET | Freq: Every day | ORAL | 0 refills | Status: AC

## 2017-04-11 MED ORDER — ONDANSETRON HCL 8 MG PO TABS: 8.0000 mg | ORAL_TABLET | Freq: Two times a day (BID) | ORAL | 0 refills | Status: DC | PRN

## 2017-04-11 MED ORDER — PROCHLORPERAZINE MALEATE 10 MG PO TABS: 10.0000 mg | ORAL_TABLET | Freq: Four times a day (QID) | ORAL | 0 refills | Status: DC | PRN

## 2017-04-11 NOTE — Discharge Summary (Signed)
Physician Discharge Summary  Tyler [name] JIR:[id number] DOB: 1951/02/08 DOA: 03/27/2017  PCP: Andreas Blower, MD  Admit date: 03/27/2017 Discharge date: 04/11/2017  Admitted From: SNF Disposition:  SNF  Recommendations for Outpatient Follow-up:  1. Follow up with PCP in 1-2 weeks 2. Please obtain BMP/CBC in one week 3. Please follow up with oncologist after hospital discharge. 4. Ensure patient follows up with Urology for outpatient cystoscopy, right ureteroscopy with biopsy and BL retrograde pyelogram to evaluate ureteral lesion   Discharge Condition:stable CODE STATUS: Full Diet recommendation: Heart Healthy / Carb Modified / Regular / Dysphagia   Brief/Interim Summary: You may copy/paste interim summary or write brief hospital course depending on length of stay  Discharge Diagnoses:  66 y/o with hypercalcemia who was found to have a new diagnosis of non hodgkin lymphoma. Pt transferred to [facility] for further treatment options from oncological services  Neck mass/ Adenopathy/ Retroperitoneal lymphadenopathy/  Large cell lymphoma [facility]) - oncology and radiation oncology on board and managing. - Will continue supportive therapy. - port placed by IR. Plan outlined by Dr. Irene Limbo with oncology:  Plan 1. 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  Plan -No overt prohibitive toxicities from chemotherapy at this time. No overt evidence of tumor lysis syndrome. -received Neupogen for 5 days while in the hospital. Leucocytosis from granix -anticipate this will drop as counts nadir from chemotherapy. No symptoms suggestive of infection. -Prednisone 60 mg daily for cycle 1 will be completed today. - If molecular markers suggest double hit large cell  lymphoma would consider switching to R-EPOCH instead From his next cycle  -We will likely need placement in acute rehab vs SNF - OK to discharge to SNF/Acute rehab today from oncology standpoint point. -will need supportive medications on discharge as below  -PET/CT as outpatient in 1 week (will schedule) -outpatient 2nd cycle of R-CHOP due on 04/27/2017 with labs and clinic followup. (will schedule)  #3 ARF- resolved  Creatinine down to 0.87 #4 Abnormal LFTs - resolved - likely from lymphoma #5 Hyponatremia - ?decreased solute intake . Improving sodium levels up to 133 PLan -evaluation and mx of hyponatremia per hospitalist. #6 Mild hypophosphatemia. No evidence of TLS -neutraphos 1 pkt BID x 2 days -encourage improved po intake.   #3ARF- resolved Creatinine down to 0.866 #4Abnormal LFTs - resolved - likely from lymphoma #5Hyponatremia - ?decreased solute intake . Improving sodium levels up to 133  PLan -evaluation and mx of hyponatremia per hospitalist. #6 Mild hypophosphatemia. No evidence of TLS -neutraphos 1 pkt BID x 2 days -encourage improved po intake.  Hyponatremia - most likely due to poor oral solute intake. - stable currently patient is asymptomatic.  Principal Problem:   Hypercalcemia - resolved after correction accounting for hypoalbuminemia  Active Problems:   Generalized weakness - Pt to continue PT services     History of traumatic brain injury (1976)   Acute Kidney Injury (AKI) - resolved    Normocytic anemia - stable no active bleeding    Pressure injury of skin - present prior to admission continue routine wound care  Discharge Instructions  Discharge Instructions    Call MD for:  difficulty breathing, headache or visual disturbances    Complete by:  As directed    Call MD  for:  severe uncontrolled pain    Complete by:  As directed    Call MD for:  temperature >100.4    Complete by:  As directed    Diet - low sodium heart  healthy    Complete by:  As directed    Increase activity slowly    Complete by:  As directed    [place]    Complete by:  As directed    Chemotherapy Appointment - 7 hr   TREATMENT CONDITIONS    Complete by:  As directed    Patient should have CBC & CMP within 7 days prior to chemotherapy administration. NOTIFY MD IF: ANC < 1500, Hemoglobin < 8, PLT < 100,000,  Total Bili > 1.5, Creatinine > 1.5, ALT & AST > 80 or if patient has unstable vital signs: Temperature > 38.5, SBP > 180 or < 90, RR > 30 or HR > 100.     Allergies as of 04/11/2017   No Known Allergies     Medication List    TAKE these medications   allopurinol 300 MG tablet Commonly known as:  ZYLOPRIM Take 1 tablet (300 mg total) by mouth daily.   LORazepam 0.5 MG tablet Commonly known as:  ATIVAN Take 1 tablet (0.5 mg total) by mouth every 6 (six) hours as needed (nausea or vomiting).   ondansetron 8 MG tablet Commonly known as:  ZOFRAN Take 1 tablet (8 mg total) by mouth 2 (two) times daily as needed for nausea or vomiting.   predniSONE 20 MG tablet Commonly known as:  DELTASONE Take 1 tablet (20 mg total) by mouth as directed. Take 3 tablets (60 mg total) by mouth daily. Take on days 1-5 of chemotherapy   prochlorperazine 10 MG tablet Commonly known as:  COMPAZINE Take 1 tablet (10 mg total) by mouth every 6 (six) hours as needed for nausea or vomiting.            Discharge Care Instructions        Start     Ordered   04/12/17 0000  allopurinol (ZYLOPRIM) 300 MG tablet  Daily     04/11/17 1327   04/11/17 0000  ondansetron (ZOFRAN) 8 MG tablet  2 times daily PRN     04/11/17 1327   04/11/17 0000  predniSONE (DELTASONE) 20 MG tablet  As directed     04/11/17 1327   04/11/17 0000  prochlorperazine (COMPAZINE) 10 MG tablet  Every 6 hours PRN     04/11/17 1327   04/11/17 0000  LORazepam (ATIVAN) 0.5 MG tablet  Every 6 hours PRN     04/11/17 1327   04/11/17 0000  Increase activity  slowly     04/11/17 1327   04/11/17 0000  Diet - low sodium heart healthy     04/11/17 1327   04/11/17 0000  Call MD for:  temperature >100.4     04/11/17 1327   04/11/17 0000  Call MD for:  severe uncontrolled pain     04/11/17 1327   04/11/17 0000  Call MD for:  difficulty breathing, headache or visual disturbances     04/11/17 1327   04/06/17 0000  TREATMENT CONDITIONS    Comments:  Patient should have CBC & CMP within 7 days prior to chemotherapy administration. NOTIFY MD IF: ANC < 1500, Hemoglobin < 8, PLT < 100,000,  Total Bili > 1.5, Creatinine > 1.5, ALT & AST > 80 or if patient has unstable vital signs: Temperature >  38.5, SBP > 180 or < 90, RR > 30 or HR > 100.   04/06/17 0922   04/06/17 0000  SCHEDULING COMMUNICATION    Comments:  Chemotherapy Appointment - 7 hr   04/06/17 3329      Contact information for follow-up providers    [facility].   Why:  For an appointment in 1-2 weeks when you get home. Contact information: [place] [address] [phone]            Contact information for after-discharge care    Destination    [facility] .   Specialty:  [place] information: [address] [place] [phone]                 No Known Allergies  Consultations:  ENT  Oncology  Urology   Procedures/Studies: Ct Abdomen Pelvis Wo Contrast  Result Date: 03/29/2017 CLINICAL DATA:  Inpatient. Unintentional weight loss. Non localized abdominal pain. Left neck mass, left pulmonary nodules and right upper retroperitoneal nodularity and left paraspinal soft tissue fullness on chest CT from 1 day prior. History of appendectomy. EXAM: CT ABDOMEN AND PELVIS WITHOUT CONTRAST TECHNIQUE: Multidetector CT imaging of the abdomen and pelvis was performed following the standard protocol without IV contrast. COMPARISON:  03/28/2017 chest CT.   03/27/2017 renal sonogram. FINDINGS: Lower chest: Small left and trace right dependent pleural effusions, mildly increased bilaterally since the chest CT from 1 day prior. Re- demonstration of left lower lobe infrahilar 2.8 x 2.3 cm solid pulmonary nodule (series 5/image 16). Mild compressive atelectasis in the dependent lower lobes. Hepatobiliary: Normal liver with no liver mass. Normal gallbladder with no radiopaque cholelithiasis. No biliary ductal dilatation. Pancreas: Normal, with no mass or duct dilation. Spleen: Normal size. No mass. Adrenals/Urinary Tract: No discrete right adrenal nodules. Inferior left adrenal 2.4 cm nodule with density 29 HU (series 3/ image 31). There is a 1.3 x 0.9 cm soft tissue mass in the right lumbar ureter (series 3/image 50), above which there is moderate right hydroureteronephrosis and upper lobe which the right ureter is collapsed. No renal stones. No left hydronephrosis. Normal caliber left ureter. Exophytic simple right renal cysts measuring 1.3 cm in the anterior lower right kidney and 1.4 cm in the medial lower right kidney. No additional contour deforming renal masses. There are numerous scattered solid right retroperitoneal nodules throughout the right perinephric fat measuring up to 1.6 x 1.2 cm superiorly (series 3/ image 21), 2.2 x 1.1 cm laterally (series 3/ image 37) and 1.6 x 0.9 cm posteriorly (series 3/image 30). Normal bladder. Stomach/Bowel: Grossly normal stomach. Normal caliber small bowel with no small bowel wall thickening. Appendectomy. Oral contrast progresses to the hepatic flexure of the colon. No large bowel wall thickening or diverticulosis. Moderate rectal stool with rectal diameter up to 7.7 cm. No definite rectal wall thickening. Vascular/Lymphatic: Nonaneurysmal abdominal aorta. Bulky left para- aortic adenopathy measuring up to 4.3 cm (series 3/ image 45). Bulky aortocaval adenopathy measuring up to 2.4 cm (series 3/ image 41). Bulky para celiac  adenopathy measuring up to the 1.8 cm (series 3/ image 28). Enlarged 2.0 cm right external iliac node (series 3/image 78). Bulky left common iliac adenopathy measuring up to 3.0 cm (series 3/ image 62). Reproductive: Normal size prostate with nonspecific internal prostatic calcification . Other: No pneumoperitoneum, ascites or focal fluid collection. Small fat containing right inguinal hernia. Musculoskeletal:  Left paraspinal 5.9 x 3.5 cm soft tissue mass (series 3/ image 31) at the T12 level with associated lytic destructive change at the lateral left T12 vertebral body and left costovertebral junction. Ill-defined large left paraspinal 8.0 x 5.5 cm soft tissue mass at the L4 level (series 3/ image 56) with associated lytic permeative changes in the left L4 vertebral body and left L4 transverse process. Moderate thoracolumbar spondylosis. IMPRESSION: 1. Bulky retroperitoneal adenopathy. Large left paraspinal soft tissue masses at T12 and L4 with adjacent lytic osseous changes. Extensive nodularity throughout the right perinephric fat. Indeterminate left adrenal nodule. 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 Metastatic disease from an unknown primary is not excluded. 2. Moderate right hydroureteronephrosis, apparently due to a small soft tissue mass in the right lumbar ureter. 3. Re- demonstration of 2.8 cm left infrahilar pulmonary nodule and small left and trace right dependent pleural effusions, which are slightly increased. 4. Moderate rectal stool, cannot exclude rectal fecal impaction. No evidence of bowel obstruction. Electronically Signed   By: Ilona Sorrel M.D.   On: 03/29/2017 15:22   Dg Chest 2 View  Result Date: 03/27/2017 CLINICAL DATA:  Progressively worsening generalized weakness over the past several days says that the patient is currently unable to ambulate. EXAM: CHEST  2 VIEW COMPARISON:  None. FINDINGS: AP erect and lateral images were obtained. Suboptimal inspiration accounts for crowded bronchovascular  markings, especially in the bases, and accentuates the cardiac silhouette. Taking this into account, cardiac silhouette normal in size. Thoracic aorta mildly atherosclerotic and tortuous. Hilar and mediastinal contours otherwise unremarkable. Lungs clear. Bronchovascular markings normal. Pulmonary vascularity normal. No visible pleural effusions. No pneumothorax. Degenerative changes involving the thoracic spine. IMPRESSION: Suboptimal inspiration.  No acute cardiopulmonary disease. Electronically Signed   By: Evangeline Dakin M.D.   On: 03/27/2017 12:55   Dg Abd 1 View  Result Date: 03/28/2017 CLINICAL DATA:  Abdominal pain. EXAM: ABDOMEN - 1 VIEW COMPARISON:  None. FINDINGS: There is gaseous distention of the stomach. There is air scattered throughout nondistended loops of large and small bowel. Stool scattered throughout the colon without fecal impaction. Degenerative changes in the lumbar spine. With the slight compression deformity of the left side of the superior endplate of L4, probably old. Ill-defined density at the left lung base may represent a lung mass. IMPRESSION: Ill-defined 3 cm density at the left lung base could represent a mass. CT scan of the chest with contrast recommended if this has not been previously assessed. Gaseous distention of the stomach. Probable old mild compression deformity of L4. Otherwise benign appearing abdomen. Electronically Signed   By: Lorriane Shire M.D.   On: 03/28/2017 11:42   Ct Head Wo Contrast  Result Date: 03/27/2017 CLINICAL DATA:  Progressively worsening generalized weakness recently such that the patient is unable to ambulate today. Personal history of traumatic brain injury 4 years ago. EXAM: CT HEAD WITHOUT CONTRAST TECHNIQUE: Contiguous axial images were obtained from the base of the skull through the vertex without intravenous contrast. COMPARISON:  None. FINDINGS: Significant head tilt in the gantry as the patient was unable to remain still during  imaging. Brain: Mild-to-moderate cortical and cerebellar atrophy. Panventricular enlargement enlargement out of proportion to the degree of atrophy. Severe changes of small vessel disease of the white matter, particularly in the corona radiata. No mass lesion. No midline shift. No acute hemorrhage or hematoma. No extra-axial fluid collections. No evidence of acute infarction. Vascular: Mild bilateral carotid siphon and left vertebral artery atherosclerosis. Right vertebral artery atretic.  Skull: No skull fracture or other focal osseous abnormality involving the skull. Sinuses/Orbits: Visualized paranasal sinuses, bilateral mastoid air cells and bilateral middle ear cavities well-aerated. Visualized orbits and globes are normal. Other: Nearly occlusive cerumen involving both external auditory canals. IMPRESSION: 1. No acute intracranial abnormality. 2. Mild-to-moderate cortical and cerebellar atrophy. 3. Panventricular enlargement which is out of proportion to the degree of atrophy, raising the question of normal pressure hydrocephalus. 4. Severe changes of small vessel disease of the white matter, particularly in the corona radiata Electronically Signed   By: Evangeline Dakin M.D.   On: 03/27/2017 13:01   Ct Chest Wo Contrast  Result Date: 03/28/2017 CLINICAL DATA:  Left-sided lung mass on abdominal radiograph. History of dysphagia. EXAM: CT CHEST WITHOUT CONTRAST TECHNIQUE: Multidetector CT imaging of the chest was performed following the standard protocol without IV contrast. COMPARISON:  Abdominal radiograph of 03/28/2017. Chest radiograph of 03/27/2017. FINDINGS: Cardiovascular: Mild motion degradation throughout. Exam also degraded by patient left arm position, not raised above the head. Tortuous thoracic aorta. Mild cardiomegaly, without pericardial effusion. Mediastinum/Nodes: Left neck soft tissue fullness, including image 11/series 5. On the order of 5.5 x 6.1 cm. Also coronal image 43. Upper normal size  left axillary node of 11 mm on image 89/series 5. No mediastinal adenopathy. Soft tissue density within or adjacent the central left lower lobe measures 2.8 x 1.9 cm on image 115/series 5 and image 115/series 9. This is primarily positioned medial to the left lower lobe segmental bronchi. Lungs/Pleura: Small left pleural effusion. 5 mm left upper lobe pulmonary nodule on image 96/series 9. Mild dependent left lower lobe airspace disease. Upper Abdomen: Normal imaged portions of the liver, spleen, stomach, right adrenal gland, kidneys. Soft tissue nodularity in the right pararenal space including on image 152/series 5. There is also right-sided retrocrural nodularity versus borderline adenopathy, including on image 142/series 5. Possible left paravertebral soft tissue fullness, incompletely imaged. Example image 158/series 5. Musculoskeletal: Remote left clavicular trauma. IMPRESSION: 1. Mildly motion degraded exam. 2. Left infrahilar soft tissue fullness is felt to either represent localized adenopathy or a central left lower lobe lung nodule. Consider sampling by bronchoscopy. 3. Small left pleural effusion. Mild dependent airspace disease is primarily felt to represent atelectasis. Mild concurrent infection or aspiration cannot be excluded. 4. Left neck soft tissue mass is incompletely imaged and of indeterminate etiology. Recommend physical exam correlation and consideration of dedicated neck CT (ideally with contrast). 5. Right suprarenal nodularity with suggestion of retrocrural nodularity versus borderline adenopathy. Consider dedicated abdominal imaging to exclude etiologies such as a lymphoproliferative process. 6. Equivocal soft tissue fullness about the left paravertebral region. This would also be better evaluated with dedicated abdominal imaging. 7. A left upper lobe pulmonary nodule warrants followup attention. These results will be called to the ordering clinician or representative by the Radiologist  Assistant, and communication documented in the PACS or zVision Dashboard. Electronically Signed   By: Abigail Miyamoto M.D.   On: 03/28/2017 15:23   [place] Retroperitoneal (renal,aorta,ivc Nodes)  Result Date: 03/27/2017 CLINICAL DATA:  Acute kidney injury EXAM: RENAL / URINARY TRACT ULTRASOUND COMPLETE COMPARISON:  None. FINDINGS: Right Kidney: Length: 11.4 cm. Moderate right hydronephrosis. No focal abnormality. Cortical echogenicity is within normal limits. Left Kidney: Length: 11.3 cm. Echogenicity within normal limits. No mass or hydronephrosis visualized. Bladder: Appears normal for degree of bladder distention. IMPRESSION: 1. Moderate right hydronephrosis. Further evaluation with CT would be helpful to evaluate for possible obstruction 2. Left kidney is within  normal limits Electronically Signed   By: Donavan Foil M.D.   On: 03/27/2017 17:09   Ir US Guide Vasc Access Right  Result Date: 04/07/2017 INDICATION: 66 year old male with high-grade non-Hodgkin's B-cell lymphoma. He presents for portacatheter placement. EXAM: IMPLANTED PORT A CATH PLACEMENT WITH ULTRASOUND AND FLUOROSCOPIC GUIDANCE MEDICATIONS: 2 g Ancef; The antibiotic was administered within an appropriate time interval prior to skin puncture. ANESTHESIA/SEDATION: Versed 2 mg IV; Fentanyl 100 mcg IV; Moderate Sedation Time:  23 minutes The patient was continuously monitored during the procedure by the interventional radiology nurse under my direct supervision. FLUOROSCOPY TIME:  0 minutes, 6 seconds (1 mGy) COMPLICATIONS: None immediate. PROCEDURE: The right neck and chest was prepped with chlorhexidine, and draped in the usual sterile fashion using maximum barrier technique (cap and mask, sterile gown, sterile gloves, large sterile sheet, hand hygiene and cutaneous antiseptic). Antibiotic prophylaxis was provided with 2g Ancef administered IV one hour prior to skin incision. Local anesthesia was attained by infiltration with 1% lidocaine with  epinephrine. Ultrasound demonstrated patency of the right internal jugular vein, and this was documented with an image. Under real-time ultrasound guidance, this vein was accessed with a 21 gauge micropuncture needle and image documentation was performed. A small dermatotomy was made at the access site with an 11 scalpel. A 0.018" wire was advanced into the SVC and the access needle exchanged for a 78F micropuncture vascular sheath. The 0.018" wire was then removed and a 0.035" wire advanced into the IVC. An appropriate location for the subcutaneous reservoir was selected below the clavicle and an incision was made through the skin and underlying soft tissues. The subcutaneous tissues were then dissected using a combination of blunt and sharp surgical technique and a pocket was formed. A single lumen power injectable portacatheter was then tunneled through the subcutaneous tissues from the pocket to the dermatotomy and the port reservoir placed within the subcutaneous pocket. The venous access site was then serially dilated and a peel away vascular sheath placed over the wire. The wire was removed and the port catheter advanced into position under fluoroscopic guidance. The catheter tip is positioned in the superior cavoatrial junction. This was documented with a spot image. The portacatheter was then tested and found to flush and aspirate well. The port was flushed with saline followed by 100 units/mL heparinized saline. The pocket was then closed in two layers using first subdermal inverted interrupted absorbable sutures followed by a running subcuticular suture. The epidermis was then sealed with Dermabond. The dermatotomy at the venous access site was also closed with a single inverted subdermal suture and the epidermis sealed with Dermabond. IMPRESSION: Successful placement of a right IJ approach Power Port with ultrasound and fluoroscopic guidance. The catheter is ready for use. Electronically Signed   By:  Jacqulynn Cadet M.D.   On: 04/07/2017 17:39   [place] Core Biopsy (thyroid)  Result Date: 03/30/2017 INDICATION: 66 year old male with multifocal adenopathy including a large left neck mass. EXAM: Ultrasound-guided core biopsy MEDICATIONS: None. ANESTHESIA/SEDATION: Moderate (conscious) sedation was employed during this procedure. A total of Versed to mg and Fentanyl 100 mcg was administered intravenously. Moderate Sedation Time: 10 minutes. The patient's level of consciousness and vital signs were monitored continuously by radiology nursing throughout the procedure under my direct supervision. FLUOROSCOPY TIME:  Fluoroscopy Time: 0 minutes 0 seconds (0 mGy). COMPLICATIONS: None immediate. PROCEDURE: Informed written consent was obtained from the patient after a thorough discussion of the procedural risks, benefits and alternatives. All questions  were addressed. A timeout was performed prior to the initiation of the procedure. The left neck was interrogated with ultrasound. There is a large complex heterogeneous mass. The overlying skin was sterilely prepped and draped in standard fashion using chlorhexidine skin prep. Local anesthesia was attained by infiltration with 1% lidocaine. A small dermatotomy was made. Under real-time sonographic guidance, multiple 18 gauge core biopsies were obtained using the [employer] automated biopsy device. Biopsy specimens were placed in saline and delivered to pathology for further analysis. Post biopsy ultrasound imaging demonstrates no evidence of complication. The patient tolerated the procedure well. IMPRESSION: Technically successful ultrasound-guided core biopsy of left neck nodal mass. Electronically Signed   By: Jacqulynn Cadet M.D.   On: 03/30/2017 17:27   Ir Fluoro Guide Port Insertion Right  Result Date: 04/07/2017 INDICATION: 66 year old male with high-grade non-Hodgkin's B-cell lymphoma. He presents for portacatheter placement. EXAM: IMPLANTED PORT A CATH  PLACEMENT WITH ULTRASOUND AND FLUOROSCOPIC GUIDANCE MEDICATIONS: 2 g Ancef; The antibiotic was administered within an appropriate time interval prior to skin puncture. ANESTHESIA/SEDATION: Versed 2 mg IV; Fentanyl 100 mcg IV; Moderate Sedation Time:  23 minutes The patient was continuously monitored during the procedure by the interventional radiology nurse under my direct supervision. FLUOROSCOPY TIME:  0 minutes, 6 seconds (1 mGy) COMPLICATIONS: None immediate. PROCEDURE: The right neck and chest was prepped with chlorhexidine, and draped in the usual sterile fashion using maximum barrier technique (cap and mask, sterile gown, sterile gloves, large sterile sheet, hand hygiene and cutaneous antiseptic). Antibiotic prophylaxis was provided with 2g Ancef administered IV one hour prior to skin incision. Local anesthesia was attained by infiltration with 1% lidocaine with epinephrine. Ultrasound demonstrated patency of the right internal jugular vein, and this was documented with an image. Under real-time ultrasound guidance, this vein was accessed with a 21 gauge micropuncture needle and image documentation was performed. A small dermatotomy was made at the access site with an 11 scalpel. A 0.018" wire was advanced into the SVC and the access needle exchanged for a 66F micropuncture vascular sheath. The 0.018" wire was then removed and a 0.035" wire advanced into the IVC. An appropriate location for the subcutaneous reservoir was selected below the clavicle and an incision was made through the skin and underlying soft tissues. The subcutaneous tissues were then dissected using a combination of blunt and sharp surgical technique and a pocket was formed. A single lumen power injectable portacatheter was then tunneled through the subcutaneous tissues from the pocket to the dermatotomy and the port reservoir placed within the subcutaneous pocket. The venous access site was then serially dilated and a peel away vascular  sheath placed over the wire. The wire was removed and the port catheter advanced into position under fluoroscopic guidance. The catheter tip is positioned in the superior cavoatrial junction. This was documented with a spot image. The portacatheter was then tested and found to flush and aspirate well. The port was flushed with saline followed by 100 units/mL heparinized saline. The pocket was then closed in two layers using first subdermal inverted interrupted absorbable sutures followed by a running subcuticular suture. The epidermis was then sealed with Dermabond. The dermatotomy at the venous access site was also closed with a single inverted subdermal suture and the epidermis sealed with Dermabond. IMPRESSION: Successful placement of a right IJ approach Power Port with ultrasound and fluoroscopic guidance. The catheter is ready for use. Electronically Signed   By: Jacqulynn Cadet M.D.   On: 04/07/2017 17:39  Subjective: Patient has no new complaints. No acute issues overnight  Discharge Exam: Vitals:   04/10/17 2025 04/11/17 0852  BP: 130/79 130/80  Pulse: 86 92  Resp: 20 16  Temp: 98 F (36.7 C) (!) 97.5 F (36.4 C)  SpO2: 96% 100%   Vitals:   04/10/17 1308 04/10/17 2025 04/11/17 0306 04/11/17 0852  BP: 132/72 130/79  130/80  Pulse: 94 86  92  Resp: 16 20  16   Temp: 98.2 F (36.8 C) 98 F (36.7 C)  (!) 97.5 F (36.4 C)  TempSrc: Oral Oral  Oral  SpO2: 98% 96%  100%  Weight:   73.5 kg (162 lb)   Height:        General: Pt is alert, awake, not in acute distress Cardiovascular: RRR, S1/S2 +, no rubs, no gallops Respiratory: CTA bilaterally, no wheezing, no rhonchi, equal chest rise Abdominal: Soft, NT, ND, bowel sounds  Extremities: no edema, no cyanosis  The results of significant diagnostics from this hospitalization (including imaging, microbiology, ancillary and laboratory) are listed below for reference.     Microbiology: No results found for this or any  previous visit (from the past 240 hour(s)).   Labs: BNP (last 3 results) No results for input(s): BNP in the last 8760 hours. Basic Metabolic Panel:  Recent Labs Lab 04/06/17 1553 04/07/17 0350 04/09/17 0518 04/10/17 0520  NA 133* 130* 133* 133*  K 4.3 4.3 3.9 4.2  CL 100* 102 103 100*  CO2 29 24 25 26   GLUCOSE 131* 123* 91 89  BUN 14 14 18 16   CREATININE 1.03 0.87 0.72 0.66  CALCIUM 8.0* 7.6* 7.6* 8.1*  MG 1.6*  --   --   --   PHOS  --   --  1.7*  --    Liver Function Tests:  Recent Labs Lab 04/06/17 1553 04/09/17 0518  AST 23 16  ALT 54 29  ALKPHOS 91 81  BILITOT 0.7 0.8  PROT 5.2* 4.9*  ALBUMIN 2.4* 2.5*   No results for input(s): LIPASE, AMYLASE in the last 168 hours. No results for input(s): AMMONIA in the last 168 hours. CBC:  Recent Labs Lab 04/06/17 1553 04/07/17 0350 04/09/17 0518 04/10/17 0520  WBC 13.3* 12.5* 80.1* 88.2*  NEUTROABS 10.6* 11.2* 77.7*  --   HGB 11.2* 11.3* 9.9* 10.0*  HCT 33.9* 32.6* 28.9* 29.0*  MCV 91.4 90.1 90.9 93.2  PLT 428* 391 330 285   Cardiac Enzymes: No results for input(s): CKTOTAL, CKMB, CKMBINDEX, TROPONINI in the last 168 hours. BNP: Invalid input(s): POCBNP CBG: No results for input(s): GLUCAP in the last 168 hours. D-Dimer No results for input(s): DDIMER in the last 72 hours. Hgb A1c No results for input(s): HGBA1C in the last 72 hours. Lipid Profile No results for input(s): CHOL, HDL, LDLCALC, TRIG, CHOLHDL, LDLDIRECT in the last 72 hours. Thyroid function studies No results for input(s): TSH, T4TOTAL, T3FREE, THYROIDAB in the last 72 hours.  Invalid input(s): FREET3 Anemia work up No results for input(s): VITAMINB12, FOLATE, FERRITIN, TIBC, IRON, RETICCTPCT in the last 72 hours. Urinalysis    Component Value Date/Time   COLORURINE YELLOW 03/27/2017 1434   APPEARANCEUR CLEAR 03/27/2017 1434   LABSPEC 1.013 03/27/2017 1434   PHURINE 6.0 03/27/2017 1434   GLUCOSEU NEGATIVE 03/27/2017 1434   HGBUR  NEGATIVE 03/27/2017 [place] 03/27/2017 1434   KETONESUR NEGATIVE 03/27/2017 1434   PROTEINUR NEGATIVE 03/27/2017 1434   NITRITE NEGATIVE 03/27/2017 1434   LEUKOCYTESUR NEGATIVE  03/27/2017 1434   Sepsis Labs Invalid input(s): PROCALCITONIN,  WBC,  LACTICIDVEN Microbiology No results found for this or any previous visit (from the past 240 hour(s)).   Time coordinating discharge: Over 30 minutes  SIGNED:   Velvet Bathe, MD  [facility] 04/11/2017, 1:28 PM Pager   If 7PM-7AM, please contact night-coverage [email or web address] Password [email or web address]

## 2017-04-11 NOTE — Progress Notes (Signed)
Report called to Gracie at [facility]. All questions and concerns answered.

## 2017-04-11 NOTE — Care Management Important Message (Signed)
Important Message  Patient Details  Name: Tyler [name] MRN: [id number] Date of Birth: Jan 26, 1951   Medicare Important Message Given:  Yes    Kerin Salen 04/11/2017, 10:51 AMImportant Message  Patient Details  Name: Tyler [name] MRN: [id number] Date of Birth: 1951/01/23   Medicare Important Message Given:  Yes    Kerin Salen 04/11/2017, 10:51 AM

## 2017-04-11 NOTE — Progress Notes (Signed)
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  Plan -No overt prohibitive toxicities from chemotherapy at this time. No overt evidence of tumor lysis syndrome. -received Neupogen for 5 days while in the hospital. Leucocytosis from granix -anticipate this will drop as counts nadir from chemotherapy. No symptoms suggestive of infection. -Prednisone 60 mg daily for cycle 1 will be completed today. 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 -will need supportive medications on discharge as below  -PET/CT as outpatient in 1 week (will schedule) -outpatient 2nd cycle of R-CHOP due on 04/27/2017 with labs and clinic followup. (will schedule)  #3 ARF- resolved  Creatinine down to 0.87 #4 Abnormal LFTs - resolved - likely from lymphoma #5 Hyponatremia - ?decreased solute intake . Improving sodium levels up to 133 PLan -evaluation and mx of hyponatremia per hospitalist. #6 Mild hypophosphatemia. No evidence of  TLS -neutraphos 1 pkt BID x 2 days -encourage improved po intake.   I spent 30 minutes counseling the patient face to face. The total time spent in the appointment was 40 minutes and more than 50% was on counseling and direct patient cares.  [name] MD [place] AAHIVMS [facility] Hematology/Oncology Physician [facility]  (Office):       [phone] (Work cell):  [phone] (Fax):           [phone]  04/11/2017 11:10 AM

## 2017-04-11 NOTE — Clinical Social Work Placement (Signed)
Pt discharging today to admit to [facility] for rehab. Room 308, report# [id number] Pt will transport via [place] completed medical necessity form and arranged transportation. All information provided to facility via the [place]. Pt's uncle going to [place] to complete admission paperwork- [place] received authorization from [facility] today.  See below for placement details  CLINICAL SOCIAL WORK PLACEMENT  NOTE  Date:  04/11/2017  Patient Details  Name: Tyler [name] MRN: [id number] Date of Birth: 11/21/1950  Clinical Social Work is seeking post-discharge placement for this patient at the   level of care (*CSW will initial, date and re-position this form in  chart as items are completed):      Patient/family provided with [place] Work Department's list of facilities offering this level of care within the geographic area requested by the patient (or if unable, by the patient's family).      Patient/family informed of their freedom to choose among providers that offer the needed level of care, that participate in Medicare, Medicaid or managed care program needed by the patient, have an available bed and are willing to accept the patient.      Patient/family informed of [place]'s ownership interest in [facility] and [facility], as well as of the fact that they are under no obligation to receive care at these facilities.  PASRR submitted to EDS on       PASRR number received on       Existing PASRR number confirmed on       FL2 transmitted to all facilities in geographic area requested by pt/family on       FL2 transmitted to all facilities within larger geographic area on       Patient informed that his/her managed care company has contracts with or will negotiate with certain facilities, including the following:            Patient/family informed of bed offers received.  Patient chooses bed at       Physician recommends and patient  chooses bed at      Patient to be transferred to   on  .  Patient to be transferred to facility by       Patient family notified on   of transfer.  Name of family member notified:        PHYSICIAN       Additional Comment:    _______________________________________________ Nila Nephew, LCSW 04/11/2017, 3:29 PM

## 2017-04-11 NOTE — Progress Notes (Signed)
Attempt to call report and nurse put me to a voicemail message left will attempt to call again.

## 2017-04-13 ENCOUNTER — Non-Acute Institutional Stay (SKILLED_NURSING_FACILITY): Payer: Medicare HMO | Admitting: Internal Medicine

## 2017-04-13 ENCOUNTER — Encounter: Payer: Self-pay | Admitting: Internal Medicine

## 2017-04-13 DIAGNOSIS — N1330 Unspecified hydronephrosis: Secondary | ICD-10-CM

## 2017-04-13 DIAGNOSIS — E279 Disorder of adrenal gland, unspecified: Secondary | ICD-10-CM

## 2017-04-13 DIAGNOSIS — Z87820 Personal history of traumatic brain injury: Secondary | ICD-10-CM

## 2017-04-13 DIAGNOSIS — E278 Other specified disorders of adrenal gland: Secondary | ICD-10-CM | POA: Insufficient documentation

## 2017-04-13 DIAGNOSIS — C8588 Other specified types of non-Hodgkin lymphoma, lymph nodes of multiple sites: Secondary | ICD-10-CM

## 2017-04-13 DIAGNOSIS — N179 Acute kidney failure, unspecified: Secondary | ICD-10-CM

## 2017-04-13 NOTE — Patient Instructions (Addendum)
See assessment and plan under each diagnosis in the problem list and acutely for this visit. Follow-up of the adrenal nodule and ureter mass with associated hydronephrosis will need to be verified with  Dr Ed Blalock Oncologist.

## 2017-04-13 NOTE — Assessment & Plan Note (Signed)
04/13/17 resolved, renal function will be monitored with chemotherapy

## 2017-04-13 NOTE — Assessment & Plan Note (Signed)
04/13/17 first cycle of 60 mg prednisone daily completed 9/18  second cycle of R- CHOP due 04/27/17

## 2017-04-13 NOTE — Assessment & Plan Note (Addendum)
04/13/17: date given as [birth year]th[birth year] 1994

## 2017-04-13 NOTE — Assessment & Plan Note (Signed)
Discuss follow-up with oncology

## 2017-04-13 NOTE — Assessment & Plan Note (Signed)
Verify urology follow-up after discharge from the SNF
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
## 2017-04-17 ENCOUNTER — Telehealth: Payer: Self-pay | Admitting: Hematology

## 2017-04-17 LAB — CHROMOSOME ANALYSIS, BONE MARROW

## 2017-04-17 NOTE — Telephone Encounter (Signed)
Changed time of 10/4 hosp f/u visit to 9 am and added lab/chemo. Spoke with patient uncle who gave me number for [facility] where patient is currently staying.   Spoke with Hilda Blades re new time for 10/4 @ 8:30 am.  [place]  [phone]

## 2017-04-18 ENCOUNTER — Other Ambulatory Visit: Payer: Self-pay

## 2017-04-18 ENCOUNTER — Telehealth: Payer: Self-pay

## 2017-04-18 ENCOUNTER — Telehealth: Payer: Self-pay | Admitting: Radiation Oncology

## 2017-04-18 DIAGNOSIS — C8588 Other specified types of non-Hodgkin lymphoma, lymph nodes of multiple sites: Secondary | ICD-10-CM

## 2017-04-18 LAB — BASIC METABOLIC PANEL
BUN: 13 (ref 4–21)
Creatinine: 0.6 (ref 0.6–1.3)
GLUCOSE: 97
Potassium: 3.9 (ref 3.4–5.3)
SODIUM: 134 — AB (ref 137–147)

## 2017-04-18 NOTE — Telephone Encounter (Addendum)
I called to check on the patient and his aunt, uncle [name]'s sister confirmed that Tyler [name] is walking well and not having any falls or changes in his ability to move. We will defer additional discussion of treatment to Dr. Irene Limbo moving forward and would be happy to see Mr. Norberto if there becomes a role for radiation.

## 2017-04-18 NOTE — Telephone Encounter (Signed)
Called pt uncle to f/u on appt PET scheduling process. Tyler [name], pt uncle, did not have number that was shared earlier. Said that he was going to the "rest home" today to see the pt during phone call this morning, but now says that he will not be seeing the pt until tomorrow. Shared the number for central scheduling again so that pt uncle can coordinate transportation and scheduling the PET scan. Central Scheduling [phone].

## 2017-04-18 NOTE — Progress Notes (Addendum)
Preop instructions for:    Tyler [name]                     Date of Birth  -1951-05-07                           Date of Procedure: 04/28/2017       Doctor: Dr. Kathie Rhodes  Time to arrive at August am Report to: Admitting  Procedure:Cystoscopy with biopsy, Retrograde, Ureteroscopy  Any procedure time changes, MD office will notify you!   Do not eat or drink past midnight the night before your procedure.(To include any tube feedings-must be discontinued)  Reminder:Follow bowel prep instructions per MD office!   Take these morning medications only with sips of water.(or give through gastrostomy or feeding tube).  Note: No Insulin or Diabetic meds should be given or taken the morning of the procedure!   Facility contact: [employer]                  Phone:                  Health Care POA:  Transportation contact phone#:  Please send day of procedure:current med list and meds last taken that day, confirm nothing by mouth status from what time, Patient Demographic info( to include DNR status, problem list, allergies)   RN contact name/phone#:                              and IT consultant card and picture ID Leave all jewelry and other valuables at place where living( no metal or rings to be worn) No contact lens Women-no make-up, no lotions,perfumes,powders Men-no colognes,lotions  Any questions day of procedure,call Tyler [name] Pre-Surgical  unit-[phone]!   Sent from :[facility] Presurgical Testing   Tyler [name]                  Fax:[phone]  Sent by :Tyler [name]

## 2017-04-18 NOTE — Telephone Encounter (Signed)
Called pt uncle per Dr. Irene Limbo to communicate need to schedule PET scan. Spent 10 minutes with pt uncle to explain what the scan was, who to call to schedule, and remind about appointments for 04/27/17. We would like for the PET scan to be completed this week if possible. Number given for Central Scheduling [phone]. Mateo Flow, pt uncle, to visit pt today and coordinate PET scan appt with transportation through the facility to get the pt to and from his appt.   If any questions, pt uncle given number for [facility] and told to ask for [place]. [phone].

## 2017-04-20 ENCOUNTER — Encounter (HOSPITAL_COMMUNITY): Payer: Self-pay

## 2017-04-20 NOTE — Progress Notes (Signed)
Requested by fax current labs, poa, living will, demographic face sheet and current MAR from [place].

## 2017-04-20 NOTE — Progress Notes (Signed)
Received and placed on chart the following from [place]; demographic face sheet, current MAR, medical history, and labs of CBC and BMP done 04/18/2017 at [facility].

## 2017-04-20 NOTE — Progress Notes (Signed)
Spoke with nuse at [facility] and rereqeusted info.

## 2017-04-21 ENCOUNTER — Other Ambulatory Visit: Payer: Self-pay | Admitting: *Deleted

## 2017-04-21 LAB — TISSUE HYBRIDIZATION TO NCBH

## 2017-04-21 NOTE — Patient Outreach (Signed)
[facility]) Care Management Post-Acute Care Coordination  04/21/2017  Tyler [name] 1951-07-20 [id number]   Met with [name], SW for [facility]. Reviewed patient case. She confirms that patient will be a [place] resident of facility and there are no plans to discharge at this time.  RNCM will sign off case.  Royetta Crochet. Laymond Purser, RN, BSN, [facility] Post-Acute Care Coordinator [phone]

## 2017-04-25 ENCOUNTER — Encounter (HOSPITAL_COMMUNITY): Payer: Self-pay | Admitting: *Deleted

## 2017-04-25 ENCOUNTER — Other Ambulatory Visit: Payer: Self-pay | Admitting: Urology

## 2017-04-25 NOTE — Progress Notes (Signed)
Preop instructions for: Tyler [name]                         Date of Birth      [birth year]                       Date of Procedure:     04/28/2017       Doctor: [place]  Time to arrive at [facility]:  0800 Report to: Admitting  Procedure:  Cystoscopy  Any procedure time changes, MD office will notify you!   Do not eat or drink past midnight the night before your procedure.(To include any tube feedings-must be discontinued)     Take these morning medications only with sips of water.(or give through gastrostomy or feeding tube). NONE     Facility contact:  [facility]                 Phone:  [place]:  Transportation contact phone#:  [facility]   Please send day of procedure:current med list and meds last taken that day, confirm nothing by mouth status from what time, Patient Demographic info( to include DNR status, problem list, allergies)   RN contact name/phone#:   Helene Kelp [phone]                          and Fax [phone]  [place] card and picture ID Leave all jewelry and other valuables at place where living( no metal or rings to be worn) No contact lens Women-no make-up, no lotions,perfumes,powders Men-no colognes,lotions      Sent from :[facility] Presurgical Testing                   [place]                   Fax:[phone]  Sent by  :         Gillian Shields RN

## 2017-04-27 ENCOUNTER — Ambulatory Visit (HOSPITAL_BASED_OUTPATIENT_CLINIC_OR_DEPARTMENT_OTHER): Payer: Medicare HMO | Admitting: Hematology

## 2017-04-27 ENCOUNTER — Telehealth: Payer: Self-pay | Admitting: Medical Oncology

## 2017-04-27 ENCOUNTER — Telehealth: Payer: Self-pay

## 2017-04-27 ENCOUNTER — Ambulatory Visit (HOSPITAL_BASED_OUTPATIENT_CLINIC_OR_DEPARTMENT_OTHER): Payer: Medicare HMO

## 2017-04-27 ENCOUNTER — Telehealth: Payer: Self-pay | Admitting: Hematology

## 2017-04-27 ENCOUNTER — Other Ambulatory Visit (HOSPITAL_BASED_OUTPATIENT_CLINIC_OR_DEPARTMENT_OTHER): Payer: Medicare HMO

## 2017-04-27 VITALS — BP 127/70 | HR 93 | Temp 98.1°F | Resp 18

## 2017-04-27 DIAGNOSIS — N1330 Unspecified hydronephrosis: Secondary | ICD-10-CM | POA: Diagnosis not present

## 2017-04-27 DIAGNOSIS — D72829 Elevated white blood cell count, unspecified: Secondary | ICD-10-CM | POA: Diagnosis not present

## 2017-04-27 DIAGNOSIS — Z5112 Encounter for antineoplastic immunotherapy: Secondary | ICD-10-CM | POA: Diagnosis not present

## 2017-04-27 DIAGNOSIS — C8338 Diffuse large B-cell lymphoma, lymph nodes of multiple sites: Secondary | ICD-10-CM

## 2017-04-27 DIAGNOSIS — C8588 Other specified types of non-Hodgkin lymphoma, lymph nodes of multiple sites: Secondary | ICD-10-CM

## 2017-04-27 DIAGNOSIS — Z5111 Encounter for antineoplastic chemotherapy: Secondary | ICD-10-CM

## 2017-04-27 DIAGNOSIS — E871 Hypo-osmolality and hyponatremia: Secondary | ICD-10-CM | POA: Diagnosis not present

## 2017-04-27 LAB — CBC & DIFF AND RETIC
BASO%: 0.2 % (ref 0.0–2.0)
Basophils Absolute: 0 10*3/uL (ref 0.0–0.1)
EOS%: 0.6 % (ref 0.0–7.0)
Eosinophils Absolute: 0 10*3/uL (ref 0.0–0.5)
HCT: 32.9 % — ABNORMAL LOW (ref 38.4–49.9)
HGB: 10.8 g/dL — ABNORMAL LOW (ref 13.0–17.1)
Immature Retic Fract: 3.2 % (ref 3.00–10.60)
LYMPH#: 0.9 10*3/uL (ref 0.9–3.3)
LYMPH%: 17.8 % (ref 14.0–49.0)
MCH: 32.3 pg (ref 27.2–33.4)
MCHC: 32.8 g/dL (ref 32.0–36.0)
MCV: 98.5 fL — AB (ref 79.3–98.0)
MONO#: 1.1 10*3/uL — AB (ref 0.1–0.9)
MONO%: 21.7 % — ABNORMAL HIGH (ref 0.0–14.0)
NEUT%: 59.7 % (ref 39.0–75.0)
NEUTROS ABS: 3.1 10*3/uL (ref 1.5–6.5)
PLATELETS: 287 10*3/uL (ref 140–400)
RBC: 3.34 10*6/uL — ABNORMAL LOW (ref 4.20–5.82)
RDW: 19.8 % — ABNORMAL HIGH (ref 11.0–14.6)
Retic %: 3.43 % — ABNORMAL HIGH (ref 0.80–1.80)
Retic Ct Abs: 114.56 10*3/uL — ABNORMAL HIGH (ref 34.80–93.90)
WBC: 5.1 10*3/uL (ref 4.0–10.3)

## 2017-04-27 LAB — COMPREHENSIVE METABOLIC PANEL
ALBUMIN: 2.8 g/dL — AB (ref 3.5–5.0)
ALK PHOS: 94 U/L (ref 40–150)
ALT: 13 U/L (ref 0–55)
AST: 16 U/L (ref 5–34)
Anion Gap: 5 mEq/L (ref 3–11)
BUN: 8.9 mg/dL (ref 7.0–26.0)
CALCIUM: 8.5 mg/dL (ref 8.4–10.4)
CO2: 25 mEq/L (ref 22–29)
CREATININE: 0.7 mg/dL (ref 0.7–1.3)
Chloride: 104 mEq/L (ref 98–109)
EGFR: >90 mL/min/{1.73_m2} (ref >90)
Glucose: 103 mg/dl (ref 70–140)
Potassium: 4.2 mEq/L (ref 3.5–5.1)
Sodium: 134 mEq/L — ABNORMAL LOW (ref 136–145)
TOTAL PROTEIN: 5.4 g/dL — AB (ref 6.4–8.3)
Total Bilirubin: 0.3 mg/dL (ref 0.20–1.20)

## 2017-04-27 LAB — LACTATE DEHYDROGENASE: LDH: 169 U/L (ref 125–245)

## 2017-04-27 MED ORDER — DOXORUBICIN HCL CHEMO IV INJECTION 2 MG/ML
50.0000 mg/m2 | Freq: Once | INTRAVENOUS | Status: AC
  Administered 2017-04-27: 98 mg via INTRAVENOUS
  Filled 2017-04-27: qty 49

## 2017-04-27 MED ORDER — ACETAMINOPHEN 325 MG PO TABS
650.0000 mg | ORAL_TABLET | Freq: Once | ORAL | Status: AC
  Administered 2017-04-27: 650 mg via ORAL

## 2017-04-27 MED ORDER — DEXAMETHASONE SODIUM PHOSPHATE 10 MG/ML IJ SOLN
INTRAMUSCULAR | Status: AC
Start: 2017-04-27 — End: 2017-04-27
  Filled 2017-04-27: qty 1

## 2017-04-27 MED ORDER — DIPHENHYDRAMINE HCL 25 MG PO CAPS
ORAL_CAPSULE | ORAL | Status: AC
  Filled 2017-04-27: qty 2

## 2017-04-27 MED ORDER — SODIUM CHLORIDE 0.9 % IV SOLN
750.0000 mg/m2 | Freq: Once | INTRAVENOUS | Status: AC
  Administered 2017-04-27: 1460 mg via INTRAVENOUS
  Filled 2017-04-27: qty 73

## 2017-04-27 MED ORDER — DEXAMETHASONE SODIUM PHOSPHATE 10 MG/ML IJ SOLN
INTRAMUSCULAR | Status: AC
  Filled 2017-04-27: qty 1

## 2017-04-27 MED ORDER — SODIUM CHLORIDE 0.9% FLUSH
10.0000 mL | INTRAVENOUS | Status: DC | PRN
  Administered 2017-04-27 (×2): 10 mL
  Filled 2017-04-27: qty 10

## 2017-04-27 MED ORDER — DIPHENHYDRAMINE HCL 25 MG PO CAPS
50.0000 mg | ORAL_CAPSULE | Freq: Once | ORAL | Status: AC
Start: 2017-04-27 — End: 2017-04-27
  Administered 2017-04-27: 50 mg via ORAL

## 2017-04-27 MED ORDER — ACETAMINOPHEN 325 MG PO TABS
ORAL_TABLET | ORAL | Status: AC
  Filled 2017-04-27: qty 2

## 2017-04-27 MED ORDER — PALONOSETRON HCL INJECTION 0.25 MG/5ML
0.2500 mg | Freq: Once | INTRAVENOUS | Status: AC
Start: 2017-04-27 — End: 2017-04-27
  Administered 2017-04-27: 0.25 mg via INTRAVENOUS

## 2017-04-27 MED ORDER — DEXAMETHASONE SODIUM PHOSPHATE 10 MG/ML IJ SOLN
10.0000 mg | Freq: Once | INTRAMUSCULAR | Status: AC
  Administered 2017-04-27: 10 mg via INTRAVENOUS

## 2017-04-27 MED ORDER — VINCRISTINE SULFATE CHEMO INJECTION 1 MG/ML
2.0000 mg | Freq: Once | INTRAVENOUS | Status: AC
  Administered 2017-04-27: 2 mg via INTRAVENOUS
  Filled 2017-04-27: qty 2

## 2017-04-27 MED ORDER — SODIUM CHLORIDE 0.9 % IV SOLN: 10.0000 mg | Freq: Once | INTRAVENOUS | Status: DC

## 2017-04-27 MED ORDER — PALONOSETRON HCL INJECTION 0.25 MG/5ML
INTRAVENOUS | Status: AC
  Filled 2017-04-27: qty 5

## 2017-04-27 MED ORDER — SODIUM CHLORIDE 0.9 % IV SOLN
375.0000 mg/m2 | Freq: Once | INTRAVENOUS | Status: AC
  Administered 2017-04-27: 700 mg via INTRAVENOUS
  Filled 2017-04-27: qty 20

## 2017-04-27 MED ORDER — HEPARIN SOD (PORK) LOCK FLUSH 100 UNIT/ML IV SOLN
500.0000 [IU] | Freq: Once | INTRAVENOUS | Status: AC | PRN
  Administered 2017-04-27: 500 [IU]
  Filled 2017-04-27: qty 5

## 2017-04-27 MED ORDER — SODIUM CHLORIDE 0.9 % IV SOLN
Freq: Once | INTRAVENOUS | Status: AC
  Administered 2017-04-27: 12:00:00 via INTRAVENOUS

## 2017-04-27 NOTE — Telephone Encounter (Signed)
Spoke to Bond at [facility] and confirmed appt for pt to come to cancer center on Saturday oct 6 at 1000. She confirmed. I also told her to please look at his discharge papers that will be in his manila envelope for future appts.

## 2017-04-27 NOTE — Telephone Encounter (Signed)
Pt family member called this morning frustrated because she had been told two different things. Called from [facility] and told that pt appointments for today had been cancelled, but they had not. Pt original appt time 0830 for lab. Communicated with [facility] to explain that pt appt had not been cancelled, and secretary told me that pt's nurse would call me back.   Spoke with Audie Clear and Sherren Mocha in scheduling who additionally received calls from family member and [place]. Identified miscommunication between the rehab facility and [facility]. Resolved for patient to be transported from facility to [facility] Arrival time estimated to be 1000. Labs to be drawn in infusion and Dr. Irene Limbo to meet with patient and family in infusion. Communicated change to RN in infusion and charge nurse.   Paperwork given to Baker Janus and Langley Gauss (patient cousins) to have the patient to sign for [facility] authority to share information with them. Currently, uncle of patient had been coordinating appointments, but is not able to understand process. Rene Kocher, is able and willing to take over coordination for the patient.

## 2017-04-27 NOTE — Telephone Encounter (Signed)
[place] Information Release Form completed by patient for following family members to be approved to receive patient information: Delena Bali, [address] Jocelyn Lamer, and Glendale Chard. All family members listed are cousins of the patient. Document given to Mirian Mo, HIM for scanning.
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
## 2017-04-27 NOTE — Telephone Encounter (Signed)
Scheduled appt per 10/4 los - patient to get new schedule in the treatment area - per Rn amy to let nurse know to print it out.

## 2017-04-27 NOTE — Telephone Encounter (Signed)
Contact information for family given to registration to complete.

## 2017-04-27 NOTE — Patient Instructions (Signed)
[facility] Discharge Instructions for Patients Receiving Chemotherapy  Today you received the following chemotherapy agents: Rituxin, Adriamycin, Vincristine, and Cytoxan To help prevent nausea and vomiting after your treatment, we encourage you to take your nausea medication  prochlorperazine (COMPAZINE) 10 MG tablet 30 tablet 0 04/11/2017    Sig - Route: Take 1 tablet (10 mg total) by mouth every 6 (six) hours as needed for nausea or vomiting. - Oral    Rituximab injection What is this medicine? RITUXIMAB (ri TUX i mab) is a monoclonal antibody. It is used to treat certain types of cancer like non-Hodgkin lymphoma and chronic lymphocytic leukemia. It is also used to treat rheumatoid arthritis, granulomatosis with polyangiitis (or Wegener's granulomatosis), and microscopic polyangiitis. This medicine may be used for other purposes; ask your health care provider or pharmacist if you have questions. COMMON BRAND NAME(S): Rituxan What should I tell my health care provider before I take this medicine? They need to know if you have any of these conditions: -heart disease -infection (especially a virus infection such as hepatitis B, chickenpox, cold sores, or herpes) -immune system problems -irregular heartbeat -kidney disease -lung or breathing disease, like asthma -recently received or scheduled to receive a vaccine -an unusual or allergic reaction to rituximab, mouse proteins, other medicines, foods, dyes, or preservatives -pregnant or trying to get pregnant -breast-feeding How should I use this medicine? This medicine is for infusion into a vein. It is administered in a hospital or clinic by a specially trained health care professional. A special MedGuide will be given to you by the pharmacist with each prescription and refill. Be sure to read this information carefully each time. Talk to your pediatrician regarding the use of this medicine in children. This medicine is not  approved for use in children. Overdosage: If you think you have taken too much of this medicine contact a poison control center or emergency room at once. NOTE: This medicine is only for you. Do not share this medicine with others. What if I miss a dose? It is important not to miss a dose. Call your doctor or health care professional if you are unable to keep an appointment. What may interact with this medicine? -cisplatin -other medicines for arthritis like disease modifying antirheumatic drugs or tumor necrosis factor inhibitors -live virus vaccines This list may not describe all possible interactions. Give your health care provider a list of all the medicines, herbs, non-prescription drugs, or dietary supplements you use. Also tell them if you smoke, drink alcohol, or use illegal drugs. Some items may interact with your medicine. What should I watch for while using this medicine? Your condition will be monitored carefully while you are receiving this medicine. You may need blood work done while you are taking this medicine. This medicine can cause serious allergic reactions. To reduce your risk you may need to take medicine before treatment with this medicine. Take your medicine as directed. In some patients, this medicine may cause a serious brain infection that may cause death. If you have any problems seeing, thinking, speaking, walking, or standing, tell your doctor right away. If you cannot reach your doctor, urgently seek other source of medical care. Call your doctor or health care professional for advice if you get a fever, chills or sore throat, or other symptoms of a cold or flu. Do not treat yourself. This drug decreases your body's ability to fight infections. Try to avoid being around people who are sick. Do not become pregnant  while taking this medicine or for 12 months after stopping it. Women should inform their doctor if they wish to become pregnant or think they might be pregnant.  There is a potential for serious side effects to an unborn child. Talk to your health care professional or pharmacist for more information. What side effects may I notice from receiving this medicine? Side effects that you should report to your doctor or health care professional as soon as possible: -breathing problems -chest pain -dizziness or feeling faint -fast, irregular heartbeat -low blood counts - this medicine may decrease the number of white blood cells, red blood cells and platelets. You may be at increased risk for infections and bleeding. -mouth sores -redness, blistering, peeling or loosening of the skin, including inside the mouth (this can be added for any serious or exfoliative rash that could lead to hospitalization) -signs of infection - fever or chills, cough, sore throat, pain or difficulty passing urine -signs and symptoms of kidney injury like trouble passing urine or change in the amount of urine -signs and symptoms of liver injury like dark yellow or brown urine; general ill feeling or flu-like symptoms; light-colored stools; loss of appetite; nausea; right upper belly pain; unusually weak or tired; yellowing of the eyes or skin -stomach pain -vomiting Side effects that usually do not require medical attention (report to your doctor or health care professional if they continue or are bothersome): -headache -joint pain -muscle cramps or muscle pain This list may not describe all possible side effects. Call your doctor for medical advice about side effects. You may report side effects to FDA at 1-800-FDA-1088. Where should I keep my medicine? This drug is given in a hospital or clinic and will not be stored at home. NOTE: This sheet is a summary. It may not cover all possible information. If you have questions about this medicine, talk to your doctor, pharmacist, or health care provider.  2018 [employer] (2016-02-17 15:28:09)   If you develop nausea and  vomiting that is not controlled by your nausea medication, call the clinic.   BELOW ARE SYMPTOMS THAT SHOULD BE REPORTED IMMEDIATELY:  *FEVER GREATER THAN 100.5 F  *CHILLS WITH OR WITHOUT FEVER  NAUSEA AND VOMITING THAT IS NOT CONTROLLED WITH YOUR NAUSEA MEDICATION  *UNUSUAL SHORTNESS OF BREATH  *UNUSUAL BRUISING OR BLEEDING  TENDERNESS IN MOUTH AND THROAT WITH OR WITHOUT PRESENCE OF ULCERS  *URINARY PROBLEMS  *BOWEL PROBLEMS  UNUSUAL RASH Items with * indicate a potential emergency and should be followed up as soon as possible.  Feel free to call the clinic should you have any questions or concerns. The clinic phone number is [phone].  Please show the [facility] at check-in to the Emergency Department and triage nurse.

## 2017-04-27 NOTE — Progress Notes (Signed)
Excellent blood return before ,during and after Adriamycin.
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
## 2017-04-28 ENCOUNTER — Encounter (HOSPITAL_COMMUNITY): Payer: Self-pay | Admitting: *Deleted

## 2017-04-28 ENCOUNTER — Ambulatory Visit: Payer: Commercial Managed Care - HMO

## 2017-04-28 ENCOUNTER — Ambulatory Visit (HOSPITAL_COMMUNITY)
Admission: RE | Admit: 2017-04-28 | Discharge: 2017-04-28 | Disposition: A | Discharge status: Home / Self Care | Payer: Medicare HMO | Source: Ambulatory Visit | Attending: Urology | Admitting: Urology

## 2017-04-28 ENCOUNTER — Encounter (HOSPITAL_COMMUNITY)
Admission: RE | Disposition: A | Discharge status: Home / Self Care | Payer: Self-pay | Source: Ambulatory Visit | Attending: Urology

## 2017-04-28 ENCOUNTER — Ambulatory Visit (HOSPITAL_COMMUNITY): Payer: Medicare HMO | Admitting: Registered Nurse

## 2017-04-28 ENCOUNTER — Ambulatory Visit (HOSPITAL_COMMUNITY): Payer: Medicare HMO

## 2017-04-28 DIAGNOSIS — Z9221 Personal history of antineoplastic chemotherapy: Secondary | ICD-10-CM | POA: Insufficient documentation

## 2017-04-28 DIAGNOSIS — Z79899 Other long term (current) drug therapy: Secondary | ICD-10-CM | POA: Diagnosis not present

## 2017-04-28 DIAGNOSIS — D649 Anemia, unspecified: Secondary | ICD-10-CM | POA: Diagnosis not present

## 2017-04-28 DIAGNOSIS — Z8489 Family history of other specified conditions: Secondary | ICD-10-CM | POA: Insufficient documentation

## 2017-04-28 DIAGNOSIS — Z8261 Family history of arthritis: Secondary | ICD-10-CM | POA: Insufficient documentation

## 2017-04-28 DIAGNOSIS — N179 Acute kidney failure, unspecified: Secondary | ICD-10-CM | POA: Diagnosis not present

## 2017-04-28 DIAGNOSIS — N1330 Unspecified hydronephrosis: Secondary | ICD-10-CM

## 2017-04-28 DIAGNOSIS — C8510 Unspecified B-cell lymphoma, unspecified site: Secondary | ICD-10-CM | POA: Insufficient documentation

## 2017-04-28 DIAGNOSIS — N2889 Other specified disorders of kidney and ureter: Secondary | ICD-10-CM | POA: Diagnosis not present

## 2017-04-28 HISTORY — DX: Elevated white blood cell count, unspecified: D72.829

## 2017-04-28 HISTORY — DX: Diffuse large B-cell lymphoma, unspecified site: C83.30

## 2017-04-28 HISTORY — DX: Unsteadiness on feet: R26.81

## 2017-04-28 HISTORY — DX: Hypo-osmolality and hyponatremia: E87.1

## 2017-04-28 HISTORY — PX: CYSTOSCOPY/RETROGRADE/URETEROSCOPY: SHX5316

## 2017-04-28 HISTORY — DX: Muscle weakness (generalized): M62.81

## 2017-04-28 HISTORY — DX: Anemia, unspecified: D64.9

## 2017-04-28 HISTORY — DX: Acute kidney failure, unspecified: N17.9

## 2017-04-28 SURGERY — CYSTOSCOPY/RETROGRADE/URETEROSCOPY
Anesthesia: General | Laterality: Right

## 2017-04-28 MED ORDER — PROPOFOL 10 MG/ML IV BOLUS
INTRAVENOUS | Status: DC | PRN
  Administered 2017-04-28: 30 mg via INTRAVENOUS
  Administered 2017-04-28: 140 mg via INTRAVENOUS

## 2017-04-28 MED ORDER — PROPOFOL 10 MG/ML IV BOLUS
INTRAVENOUS | Status: AC
  Filled 2017-04-28: qty 20

## 2017-04-28 MED ORDER — OXYCODONE HCL 5 MG/5ML PO SOLN
5.0000 mg | Freq: Once | ORAL | Status: DC | PRN
  Filled 2017-04-28: qty 5

## 2017-04-28 MED ORDER — LIDOCAINE 2% (20 MG/ML) 5 ML SYRINGE
INTRAMUSCULAR | Status: DC | PRN
  Administered 2017-04-28: 60 mg via INTRAVENOUS

## 2017-04-28 MED ORDER — FENTANYL CITRATE (PF) 100 MCG/2ML IJ SOLN
INTRAMUSCULAR | Status: DC | PRN
  Administered 2017-04-28 (×2): 50 ug via INTRAVENOUS

## 2017-04-28 MED ORDER — ONDANSETRON HCL 4 MG/2ML IJ SOLN: 4.0000 mg | Freq: Once | INTRAMUSCULAR | Status: DC | PRN

## 2017-04-28 MED ORDER — FENTANYL CITRATE (PF) 100 MCG/2ML IJ SOLN: 25.0000 ug | INTRAMUSCULAR | Status: DC | PRN

## 2017-04-28 MED ORDER — SODIUM CHLORIDE 0.9 % IR SOLN
Status: DC | PRN
  Administered 2017-04-28: 3000 mL

## 2017-04-28 MED ORDER — IOHEXOL 300 MG/ML  SOLN
INTRAMUSCULAR | Status: DC | PRN
  Administered 2017-04-28: 20 mL

## 2017-04-28 MED ORDER — LACTATED RINGERS IV SOLN
INTRAVENOUS | Status: DC
Start: 2017-04-28 — End: 2017-04-28
  Administered 2017-04-28: 09:00:00 via INTRAVENOUS

## 2017-04-28 MED ORDER — DEXAMETHASONE SODIUM PHOSPHATE 10 MG/ML IJ SOLN
INTRAMUSCULAR | Status: AC
  Filled 2017-04-28: qty 1

## 2017-04-28 MED ORDER — ONDANSETRON HCL 4 MG/2ML IJ SOLN
INTRAMUSCULAR | Status: AC
  Filled 2017-04-28: qty 2

## 2017-04-28 MED ORDER — LIDOCAINE 2% (20 MG/ML) 5 ML SYRINGE
INTRAMUSCULAR | Status: AC
  Filled 2017-04-28: qty 5

## 2017-04-28 MED ORDER — FENTANYL CITRATE (PF) 100 MCG/2ML IJ SOLN
INTRAMUSCULAR | Status: AC
  Filled 2017-04-28: qty 2

## 2017-04-28 MED ORDER — ONDANSETRON HCL 4 MG/2ML IJ SOLN
INTRAMUSCULAR | Status: DC | PRN
Start: 2017-04-28 — End: 2017-04-28
  Administered 2017-04-28: 4 mg via INTRAVENOUS

## 2017-04-28 MED ORDER — CEFAZOLIN SODIUM-DEXTROSE 2-4 GM/100ML-% IV SOLN
2.0000 g | Freq: Once | INTRAVENOUS | Status: AC
  Administered 2017-04-28: 2 g via INTRAVENOUS
  Filled 2017-04-28: qty 100

## 2017-04-28 MED ORDER — DEXAMETHASONE SODIUM PHOSPHATE 10 MG/ML IJ SOLN
INTRAMUSCULAR | Status: DC | PRN
Start: 2017-04-28 — End: 2017-04-28
  Administered 2017-04-28: 10 mg via INTRAVENOUS

## 2017-04-28 MED ORDER — OXYCODONE HCL 5 MG PO TABS: 5.0000 mg | ORAL_TABLET | Freq: Once | ORAL | Status: DC | PRN

## 2017-04-28 SURGICAL SUPPLY — 28 items
BAG URINE DRAINAGE (UROLOGICAL SUPPLIES) IMPLANT
BAG URO CATCHER STRL LF (MISCELLANEOUS) ×3 IMPLANT
BASKET DAKOTA 1.9FR 11X120 (BASKET) IMPLANT
BASKET ZERO TIP 1.9FR (BASKET) IMPLANT
BASKET ZERO TIP NITINOL 2.4FR (BASKET) IMPLANT
CATH INTERMIT  6FR 70CM (CATHETERS) ×3 IMPLANT
CLOTH BEACON ORANGE TIMEOUT ST (SAFETY) ×3 IMPLANT
COVER FOOTSWITCH UNIV (MISCELLANEOUS) IMPLANT
COVER SURGICAL LIGHT HANDLE (MISCELLANEOUS) IMPLANT
ELECT REM PT RETURN 15FT ADLT (MISCELLANEOUS) ×3 IMPLANT
EVACUATOR MICROVAS BLADDER (UROLOGICAL SUPPLIES) IMPLANT
GLOVE BIOGEL M 8.0 STRL (GLOVE) ×3 IMPLANT
GOWN STRL REUS W/TWL XL LVL3 (GOWN DISPOSABLE) ×3 IMPLANT
GUIDEWIRE ANG ZIPWIRE 038X150 (WIRE) IMPLANT
GUIDEWIRE STR DUAL SENSOR (WIRE) ×3 IMPLANT
HOLDER FOLEY CATH W/STRAP (MISCELLANEOUS) IMPLANT
IV NS 1000ML (IV SOLUTION)
IV NS 1000ML BAXH (IV SOLUTION) IMPLANT
LOOP CUT BIPOLAR 24F LRG (ELECTROSURGICAL) IMPLANT
MANIFOLD NEPTUNE II (INSTRUMENTS) ×3 IMPLANT
NS IRRIG 1000ML POUR BTL (IV SOLUTION) IMPLANT
PACK CYSTO (CUSTOM PROCEDURE TRAY) ×3 IMPLANT
SET ASPIRATION TUBING (TUBING) IMPLANT
SHEATH ACCESS URETERAL 24CM (SHEATH) IMPLANT
SHEATH ACCESS URETERAL 38CM (SHEATH) IMPLANT
SHEATH ACCESS URETERAL 54CM (SHEATH) IMPLANT
SYRINGE IRR TOOMEY STRL 70CC (SYRINGE) IMPLANT
TUBING CONNECTING 10 (TUBING) ×3 IMPLANT

## 2017-04-28 NOTE — Anesthesia Postprocedure Evaluation (Signed)
Anesthesia Post Note  Patient: Tyler [name]  Procedure(s) Performed: CYSTOSCOPY/RETROGRADE (Right )     Patient location during evaluation: PACU Anesthesia Type: General Level of consciousness: awake, awake and alert and oriented Pain management: pain level controlled Vital Signs Assessment: post-procedure vital signs reviewed and stable Respiratory status: spontaneous breathing, nonlabored ventilation and respiratory function stable Cardiovascular status: blood pressure returned to baseline Anesthetic complications: no    Last Vitals:  Vitals:   04/28/17 1138 04/28/17 1252  BP: 140/71 136/67  Pulse: 61 72  Resp: 14 18  Temp: (!) 36.4 C 36.4 C  SpO2: 100% 100%    Last Pain:  Vitals:   04/28/17 1252  TempSrc: Oral  PainSc: 0-No pain                 Consuelo Suthers,Marcelo COKER

## 2017-04-28 NOTE — Transfer of Care (Signed)
Immediate Anesthesia Transfer of Care Note  Patient: Tyler [name]  Procedure(s) Performed: Procedure(s) with comments: CYSTOSCOPY/RETROGRADE (Right) - RIGHT URETEROSCOPY  WITH BILATERAL RETROGRADE PYELOGRAM  Patient Location: PACU  Anesthesia Type:General  Level of Consciousness:  sedated, patient cooperative and responds to stimulation  Airway & Oxygen Therapy:Patient Spontanous Breathing and Patient connected to face mask oxgen  Post-op Assessment:  Report given to PACU RN and Post -op Vital signs reviewed and stable  Post vital signs:  Reviewed and stable  Last Vitals:  Vitals:   04/28/17 0838  Pulse: (!) 58  Resp: 18  Temp: 36.6 C  SpO2: 641%    Complications: No apparent anesthesia complications

## 2017-04-28 NOTE — Anesthesia Procedure Notes (Signed)
Procedure Name: LMA Insertion Date/Time: 04/28/2017 10:27 AM Performed by: Talbot Grumbling Pre-anesthesia Checklist: Patient identified, Emergency Drugs available, Suction available and Patient being monitored Patient Re-evaluated:Patient Re-evaluated prior to induction Oxygen Delivery Method: Circle system utilized Preoxygenation: Pre-oxygenation with 100% oxygen Induction Type: IV induction Ventilation: Mask ventilation without difficulty LMA: LMA inserted LMA Size: 4.0 Number of attempts: 1 Placement Confirmation: positive ETCO2 and breath sounds checked- equal and bilateral Tube secured with: Tape Dental Injury: Teeth and Oropharynx as per pre-operative assessment

## 2017-04-28 NOTE — Anesthesia Preprocedure Evaluation (Signed)
Anesthesia Evaluation  Patient identified by MRN, date of birth, ID band Patient awake    Reviewed: Allergy & Precautions, NPO status , Patient's Chart, lab work & pertinent test results  Airway Mallampati: II  TM Distance: >3 FB Neck ROM: Full    Dental  (+) Teeth Intact, Dental Advisory Given   Pulmonary    breath sounds clear to auscultation       Cardiovascular  Rhythm:Regular Rate:Normal     Neuro/Psych    GI/Hepatic   Endo/Other    Renal/GU      Musculoskeletal   Abdominal   Peds  Hematology   Anesthesia Other Findings   Reproductive/Obstetrics                             Anesthesia Physical Anesthesia Plan  ASA: III  Anesthesia Plan: General   Post-op Pain Management:    Induction: Intravenous  PONV Risk Score and Plan: Ondansetron and Dexamethasone  Airway Management Planned: LMA  Additional Equipment:   Intra-op Plan:   Post-operative Plan:   Informed Consent: I have reviewed the patients History and Physical, chart, labs and discussed the procedure including the risks, benefits and alternatives for the proposed anesthesia with the patient or authorized representative who has indicated his/her understanding and acceptance.     Dental advisory given  Plan Discussed with: CRNA and Anesthesiologist  Anesthesia Plan Comments:         Anesthesia Quick Evaluation  

## 2017-04-28 NOTE — Discharge Instructions (Signed)

## 2017-04-28 NOTE — Op Note (Signed)
PATIENT:  Tyler [name]  PRE-OPERATIVE DIAGNOSIS: 1. History of right hydronephrosis. 2. Possible intraluminal lesion of the right ureter  POST-OPERATIVE DIAGNOSIS: history of right hydronephrosis  PROCEDURE: cystoscopy with bilateral retrograde pyelograms including interpretation. 2. Fluoroscopy time less than 1 hour.  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 The CT scan also revealed what appeared to be a soft tissue lesion of some type in the near the right ureter with fairly significant right-sided hydronephrosis proximal to this. A follow-up renal ultrasound was performed yesterday which revealed apparent resolution of his hydronephrosis on the right-hand side however because there were no enlarged lymph nodes in the area of the ureter to cause extrinsic compression of the ureter I felt that even with resolution of his hydronephrosis evaluation of the lumen of the ureter was necessary to rule out a urothelial malignancy that would not be affected by the chemotherapy that he is receiving. He is therefore brought to the operating room fora right retrograde pyelogram and possible right ureteroscopy and because his CT scan was done without contrast I also wanted to visualize the left ureter with a retrograde study as well.  ANESTHESIA:  General  EBL:  Minimal  DRAINS: None  LOCAL MEDICATIONS USED:  None  SPECIMEN:  none  Description of procedure: After informed consent the patient was taken to the operating room and placed on the table in a supine position. General anesthesia was then administered. Once fully anesthetized the patient was moved to the dorsal lithotomy position and the genitalia were  sterilely prepped and draped in standard fashion. An official timeout was then performed.  The 23 French cystoscope was passed under direct vision down the urethra with the 30 lens. Ureter was noted be normal with a normal prostatic urethra with some bilobar hypertrophy. Upon entering the bladder and noted 1+ trabeculation. The bladder was fully and systematically inspected and there were no tumors, stones or inflammatory lesions. The right and left ureteral orifices were noted to be of normal configuration and position with clear reflux.  A 6 French open-ended urteral catheter was then passed through the cystoscope. I performed a left retrograde pyelogram by injecting full-strength Omnipaque contrast through the open-ended catheter and up the left ureter under direct fluoroscopic control. This revealed a normal ureter throughout its length with a normal intrarenal collecting system.  I then proceeded to perform a right retrograde pyelogram in an identical fashion. This revealed a normal ureter with no filling defects throughout its entire length and no evidence of hydronephrosis. There was no evidence of mass effect and the intrarenal collecting system was also noted be normal. I therefore drained the bladder and removed the cystoscope and the patient was awakened and taken to the recovery room in stable and satisfactory condition. He tolerated the procedure well no intraoperative complications.  PLAN OF CARE: Discharge to home after PACU  PATIENT DISPOSITION:  PACU - hemodynamically stable.

## 2017-04-29 ENCOUNTER — Ambulatory Visit (HOSPITAL_BASED_OUTPATIENT_CLINIC_OR_DEPARTMENT_OTHER): Payer: Medicare HMO

## 2017-04-29 VITALS — BP 128/68 | HR 76 | Temp 98.0°F | Resp 18

## 2017-04-29 DIAGNOSIS — C8338 Diffuse large B-cell lymphoma, lymph nodes of multiple sites: Secondary | ICD-10-CM

## 2017-04-29 DIAGNOSIS — Z5189 Encounter for other specified aftercare: Secondary | ICD-10-CM

## 2017-04-29 DIAGNOSIS — C8588 Other specified types of non-Hodgkin lymphoma, lymph nodes of multiple sites: Secondary | ICD-10-CM

## 2017-04-29 MED ORDER — PEGFILGRASTIM INJECTION 6 MG/0.6ML ~~LOC~~
6.0000 mg | PREFILLED_SYRINGE | Freq: Once | SUBCUTANEOUS | Status: AC
  Administered 2017-04-29: 6 mg via SUBCUTANEOUS

## 2017-04-29 NOTE — Patient Instructions (Signed)
Pegfilgrastim injection What is this medicine? PEGFILGRASTIM (PEG fil gra stim) is a long-acting granulocyte colony-stimulating factor that stimulates the growth of neutrophils, a type of white blood cell important in the body's fight against infection. It is used to reduce the incidence of fever and infection in patients with certain types of cancer who are receiving chemotherapy that affects the bone marrow, and to increase survival after being exposed to high doses of radiation. This medicine may be used for other purposes; ask your health care provider or pharmacist if you have questions. COMMON BRAND NAME(S): Neulasta What should I tell my health care provider before I take this medicine? They need to know if you have any of these conditions: -kidney disease -latex allergy -ongoing radiation therapy -sickle cell disease -skin reactions to acrylic adhesives (On-Body Injector only) -an unusual or allergic reaction to pegfilgrastim, filgrastim, other medicines, foods, dyes, or preservatives -pregnant or trying to get pregnant -breast-feeding How should I use this medicine? This medicine is for injection under the skin. If you get this medicine at home, you will be taught how to prepare and give the pre-filled syringe or how to use the On-body Injector. Refer to the patient Instructions for Use for detailed instructions. Use exactly as directed. Tell your healthcare provider immediately if you suspect that the On-body Injector may not have performed as intended or if you suspect the use of the On-body Injector resulted in a missed or partial dose. It is important that you put your used needles and syringes in a special sharps container. Do not put them in a trash can. If you do not have a sharps container, call your pharmacist or healthcare provider to get one. Talk to your pediatrician regarding the use of this medicine in children. While this drug may be prescribed for selected conditions,  precautions do apply. Overdosage: If you think you have taken too much of this medicine contact a poison control center or emergency room at once. NOTE: This medicine is only for you. Do not share this medicine with others. What if I miss a dose? It is important not to miss your dose. Call your doctor or health care professional if you miss your dose. If you miss a dose due to an On-body Injector failure or leakage, a new dose should be administered as soon as possible using a single prefilled syringe for manual use. What may interact with this medicine? Interactions have not been studied. Give your health care provider a list of all the medicines, herbs, non-prescription drugs, or dietary supplements you use. Also tell them if you smoke, drink alcohol, or use illegal drugs. Some items may interact with your medicine. This list may not describe all possible interactions. Give your health care provider a list of all the medicines, herbs, non-prescription drugs, or dietary supplements you use. Also tell them if you smoke, drink alcohol, or use illegal drugs. Some items may interact with your medicine. What should I watch for while using this medicine? You may need blood work done while you are taking this medicine. If you are going to need a MRI, CT scan, or other procedure, tell your doctor that you are using this medicine (On-Body Injector only). What side effects may I notice from receiving this medicine? Side effects that you should report to your doctor or health care professional as soon as possible: -allergic reactions like skin rash, itching or hives, swelling of the face, lips, or tongue -dizziness -fever -pain, redness, or irritation at site   where injected -pinpoint red spots on the skin -red or dark-brown urine -shortness of breath or breathing problems -stomach or side pain, or pain at the shoulder -swelling -tiredness -trouble passing urine or change in the amount of urine Side  effects that usually do not require medical attention (report to your doctor or health care professional if they continue or are bothersome): -bone pain -muscle pain This list may not describe all possible side effects. Call your doctor for medical advice about side effects. You may report side effects to FDA at 1-800-FDA-1088. Where should I keep my medicine? Keep out of the reach of children. Store pre-filled syringes in a refrigerator between 2 and 8 degrees C (36 and 46 degrees F). Do not freeze. Keep in carton to protect from light. Throw away this medicine if it is left out of the refrigerator for more than 48 hours. Throw away any unused medicine after the expiration date. NOTE: This sheet is a summary. It may not cover all possible information. If you have questions about this medicine, talk to your doctor, pharmacist, or health care provider.  2018 Elsevier/Gold Standard (2016-07-07 12:58:03)  

## 2017-05-01 ENCOUNTER — Encounter (HOSPITAL_COMMUNITY): Payer: Medicare HMO

## 2017-05-05 ENCOUNTER — Non-Acute Institutional Stay (SKILLED_NURSING_FACILITY): Payer: Medicare HMO | Admitting: Adult Health

## 2017-05-05 ENCOUNTER — Encounter: Payer: Self-pay | Admitting: Adult Health

## 2017-05-05 DIAGNOSIS — Z87820 Personal history of traumatic brain injury: Secondary | ICD-10-CM | POA: Diagnosis not present

## 2017-05-05 DIAGNOSIS — C8588 Other specified types of non-Hodgkin lymphoma, lymph nodes of multiple sites: Secondary | ICD-10-CM

## 2017-05-05 DIAGNOSIS — D638 Anemia in other chronic diseases classified elsewhere: Secondary | ICD-10-CM

## 2017-05-05 DIAGNOSIS — M1A9XX0 Chronic gout, unspecified, without tophus (tophi): Secondary | ICD-10-CM | POA: Diagnosis not present
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
## 2017-05-08 ENCOUNTER — Ambulatory Visit: Payer: Medicare HMO

## 2017-05-12 ENCOUNTER — Ambulatory Visit (HOSPITAL_COMMUNITY): Admission: RE | Admit: 2017-05-12 | Payer: Medicare HMO | Source: Ambulatory Visit

## 2017-05-17 ENCOUNTER — Other Ambulatory Visit: Payer: Self-pay

## 2017-05-17 DIAGNOSIS — C8588 Other specified types of non-Hodgkin lymphoma, lymph nodes of multiple sites: Secondary | ICD-10-CM

## 2017-05-18 ENCOUNTER — Ambulatory Visit (HOSPITAL_BASED_OUTPATIENT_CLINIC_OR_DEPARTMENT_OTHER): Payer: Medicare HMO | Admitting: Hematology

## 2017-05-18 ENCOUNTER — Encounter: Payer: Self-pay | Admitting: Pharmacist

## 2017-05-18 ENCOUNTER — Other Ambulatory Visit (HOSPITAL_BASED_OUTPATIENT_CLINIC_OR_DEPARTMENT_OTHER): Payer: Medicare HMO

## 2017-05-18 ENCOUNTER — Encounter: Payer: Self-pay | Admitting: Hematology

## 2017-05-18 ENCOUNTER — Ambulatory Visit (HOSPITAL_BASED_OUTPATIENT_CLINIC_OR_DEPARTMENT_OTHER): Payer: Medicare HMO

## 2017-05-18 VITALS — BP 118/80 | HR 97 | Temp 97.9°F | Resp 18 | Ht 72.0 in | Wt 156.6 lb

## 2017-05-18 VITALS — BP 139/70 | HR 95 | Temp 98.2°F | Resp 17

## 2017-05-18 DIAGNOSIS — D72829 Elevated white blood cell count, unspecified: Secondary | ICD-10-CM

## 2017-05-18 DIAGNOSIS — Z5112 Encounter for antineoplastic immunotherapy: Secondary | ICD-10-CM | POA: Diagnosis not present

## 2017-05-18 DIAGNOSIS — C8330 Diffuse large B-cell lymphoma, unspecified site: Secondary | ICD-10-CM

## 2017-05-18 DIAGNOSIS — Z5111 Encounter for antineoplastic chemotherapy: Secondary | ICD-10-CM

## 2017-05-18 DIAGNOSIS — C8588 Other specified types of non-Hodgkin lymphoma, lymph nodes of multiple sites: Secondary | ICD-10-CM

## 2017-05-18 DIAGNOSIS — C8338 Diffuse large B-cell lymphoma, lymph nodes of multiple sites: Secondary | ICD-10-CM

## 2017-05-18 LAB — CBC WITH DIFFERENTIAL/PLATELET
BASO%: 0.6 % (ref 0.0–2.0)
BASOS ABS: 0.1 10*3/uL (ref 0.0–0.1)
EOS%: 0.2 % (ref 0.0–7.0)
Eosinophils Absolute: 0 10*3/uL (ref 0.0–0.5)
HCT: 35.1 % — ABNORMAL LOW (ref 38.4–49.9)
HEMOGLOBIN: 12 g/dL — AB (ref 13.0–17.1)
LYMPH%: 7.5 % — ABNORMAL LOW (ref 14.0–49.0)
MCH: 33.9 pg — ABNORMAL HIGH (ref 27.2–33.4)
MCHC: 34.1 g/dL (ref 32.0–36.0)
MCV: 99.5 fL — ABNORMAL HIGH (ref 79.3–98.0)
MONO#: 1.5 10*3/uL — ABNORMAL HIGH (ref 0.1–0.9)
MONO%: 15.1 % — AB (ref 0.0–14.0)
NEUT#: 7.4 10*3/uL — ABNORMAL HIGH (ref 1.5–6.5)
NEUT%: 76.6 % — ABNORMAL HIGH (ref 39.0–75.0)
Platelets: 342 10*3/uL (ref 140–400)
RBC: 3.53 10*6/uL — ABNORMAL LOW (ref 4.20–5.82)
RDW: 22.3 % — AB (ref 11.0–14.6)
WBC: 9.7 10*3/uL (ref 4.0–10.3)
lymph#: 0.7 10*3/uL — ABNORMAL LOW (ref 0.9–3.3)

## 2017-05-18 LAB — COMPREHENSIVE METABOLIC PANEL
ALBUMIN: 3.4 g/dL — AB (ref 3.5–5.0)
ALT: 18 U/L (ref 0–55)
AST: 21 U/L (ref 5–34)
Alkaline Phosphatase: 77 U/L (ref 40–150)
Anion Gap: 8 mEq/L (ref 3–11)
BUN: 14 mg/dL (ref 7.0–26.0)
CHLORIDE: 104 meq/L (ref 98–109)
CO2: 24 meq/L (ref 22–29)
Calcium: 8.7 mg/dL (ref 8.4–10.4)
Creatinine: 0.7 mg/dL (ref 0.7–1.3)
EGFR: >60 mL/min/{1.73_m2} (ref >60)
GLUCOSE: 110 mg/dL (ref 70–140)
POTASSIUM: 4.2 meq/L (ref 3.5–5.1)
SODIUM: 136 meq/L (ref 136–145)
Total Bilirubin: 0.27 mg/dL (ref 0.20–1.20)
Total Protein: 6 g/dL — ABNORMAL LOW (ref 6.4–8.3)

## 2017-05-18 LAB — LACTATE DEHYDROGENASE: LDH: 184 U/L (ref 125–245)

## 2017-05-18 MED ORDER — DEXAMETHASONE SODIUM PHOSPHATE 10 MG/ML IJ SOLN
INTRAMUSCULAR | Status: AC
  Filled 2017-05-18: qty 1

## 2017-05-18 MED ORDER — DOXORUBICIN HCL CHEMO IV INJECTION 2 MG/ML
50.0000 mg/m2 | Freq: Once | INTRAVENOUS | Status: AC
  Administered 2017-05-18: 98 mg via INTRAVENOUS
  Filled 2017-05-18: qty 49

## 2017-05-18 MED ORDER — PALONOSETRON HCL INJECTION 0.25 MG/5ML
0.2500 mg | Freq: Once | INTRAVENOUS | Status: AC
Start: 2017-05-18 — End: 2017-05-18
  Administered 2017-05-18: 0.25 mg via INTRAVENOUS

## 2017-05-18 MED ORDER — SODIUM CHLORIDE 0.9 % IV SOLN
750.0000 mg/m2 | Freq: Once | INTRAVENOUS | Status: AC
  Administered 2017-05-18: 1460 mg via INTRAVENOUS
  Filled 2017-05-18: qty 73

## 2017-05-18 MED ORDER — DIPHENHYDRAMINE HCL 25 MG PO CAPS
ORAL_CAPSULE | ORAL | Status: AC
  Filled 2017-05-18: qty 2

## 2017-05-18 MED ORDER — PALONOSETRON HCL INJECTION 0.25 MG/5ML
INTRAVENOUS | Status: AC
  Filled 2017-05-18: qty 5

## 2017-05-18 MED ORDER — HEPARIN SOD (PORK) LOCK FLUSH 100 UNIT/ML IV SOLN
500.0000 [IU] | Freq: Once | INTRAVENOUS | Status: AC | PRN
  Administered 2017-05-18: 500 [IU]
  Filled 2017-05-18: qty 5

## 2017-05-18 MED ORDER — SODIUM CHLORIDE 0.9 % IV SOLN
375.0000 mg/m2 | Freq: Once | INTRAVENOUS | Status: AC
  Administered 2017-05-18: 700 mg via INTRAVENOUS
  Filled 2017-05-18: qty 20

## 2017-05-18 MED ORDER — DEXAMETHASONE SODIUM PHOSPHATE 10 MG/ML IJ SOLN
10.0000 mg | Freq: Once | INTRAMUSCULAR | Status: AC
  Administered 2017-05-18: 10 mg via INTRAVENOUS

## 2017-05-18 MED ORDER — VINCRISTINE SULFATE CHEMO INJECTION 1 MG/ML
2.0000 mg | Freq: Once | INTRAVENOUS | Status: AC
  Administered 2017-05-18: 2 mg via INTRAVENOUS
  Filled 2017-05-18: qty 2

## 2017-05-18 MED ORDER — SODIUM CHLORIDE 0.9% FLUSH
10.0000 mL | INTRAVENOUS | Status: DC | PRN
  Administered 2017-05-18: 10 mL
  Filled 2017-05-18: qty 10

## 2017-05-18 MED ORDER — DIPHENHYDRAMINE HCL 25 MG PO CAPS
50.0000 mg | ORAL_CAPSULE | Freq: Once | ORAL | Status: AC
  Administered 2017-05-18: 50 mg via ORAL

## 2017-05-18 MED ORDER — ACETAMINOPHEN 325 MG PO TABS
650.0000 mg | ORAL_TABLET | Freq: Once | ORAL | Status: AC
  Administered 2017-05-18: 650 mg via ORAL

## 2017-05-18 MED ORDER — ACETAMINOPHEN 325 MG PO TABS
ORAL_TABLET | ORAL | Status: AC
  Filled 2017-05-18: qty 2

## 2017-05-18 MED ORDER — SODIUM CHLORIDE 0.9 % IV SOLN
Freq: Once | INTRAVENOUS | Status: AC
  Administered 2017-05-18: 11:00:00 via INTRAVENOUS
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
## 2017-05-18 NOTE — Patient Instructions (Signed)
[facility] Discharge Instructions for Patients Receiving Chemotherapy  Today you received the following chemotherapy agents :  Adriamycin, Vincristine, Cytoxan, Rituxan.  To help prevent nausea and vomiting after your treatment, we encourage you to take your nausea medication as prescribed.   If you develop nausea and vomiting that is not controlled by your nausea medication, call the clinic.   BELOW ARE SYMPTOMS THAT SHOULD BE REPORTED IMMEDIATELY:  *FEVER GREATER THAN 100.5 F  *CHILLS WITH OR WITHOUT FEVER  NAUSEA AND VOMITING THAT IS NOT CONTROLLED WITH YOUR NAUSEA MEDICATION  *UNUSUAL SHORTNESS OF BREATH  *UNUSUAL BRUISING OR BLEEDING  TENDERNESS IN MOUTH AND THROAT WITH OR WITHOUT PRESENCE OF ULCERS  *URINARY PROBLEMS  *BOWEL PROBLEMS  UNUSUAL RASH Items with * indicate a potential emergency and should be followed up as soon as possible.  Feel free to call the clinic should you have any questions or concerns. The clinic phone number is [phone].  Please show the CHEMO ALERT CARD at check-in to the Emergency Department and triage nurse.   

## 2017-05-20 ENCOUNTER — Ambulatory Visit (HOSPITAL_BASED_OUTPATIENT_CLINIC_OR_DEPARTMENT_OTHER): Payer: Medicare HMO

## 2017-05-20 VITALS — BP 125/86 | HR 75 | Temp 98.0°F | Resp 18

## 2017-05-20 DIAGNOSIS — C8339 Diffuse large B-cell lymphoma, extranodal and solid organ sites: Secondary | ICD-10-CM

## 2017-05-20 DIAGNOSIS — C8588 Other specified types of non-Hodgkin lymphoma, lymph nodes of multiple sites: Secondary | ICD-10-CM

## 2017-05-20 MED ORDER — PEGFILGRASTIM INJECTION 6 MG/0.6ML ~~LOC~~
6.0000 mg | PREFILLED_SYRINGE | Freq: Once | SUBCUTANEOUS | Status: AC
  Administered 2017-05-20: 6 mg via SUBCUTANEOUS

## 2017-05-23 ENCOUNTER — Non-Acute Institutional Stay (SKILLED_NURSING_FACILITY): Payer: Medicare HMO

## 2017-05-23 DIAGNOSIS — Z0000 Encounter for general adult medical examination without abnormal findings: Secondary | ICD-10-CM
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
## 2017-05-23 NOTE — Patient Instructions (Signed)
Tyler [name] , Thank you for taking time to come for your Medicare Wellness Visit. I appreciate your ongoing commitment to your health goals. Please review the following plan we discussed and let me know if I can assist you in the future.   Screening recommendations/referrals: Colonoscopy excluded, pt is long term Recommended yearly ophthalmology/optometry visit for glaucoma screening and checkup Recommended yearly dental visit for hygiene and checkup  Vaccinations: Influenza vaccine due, facility will give it Pneumococcal vaccine 13 due, ordered Tdap vaccine due, ordered Shingles vaccine not in records    Advanced directives: Need a copy of living will and health care power of attorney for chart  Conditions/risks identified: none  Next appointment: Dr. Linna Darner makes rounds  Preventive Care 77 Years and Older, Male Preventive care refers to lifestyle choices and visits with your health care provider that can promote health and wellness. What does preventive care include?  A yearly physical exam. This is also called an annual well check.  Dental exams once or twice a year.  Routine eye exams. Ask your health care provider how often you should have your eyes checked.  Personal lifestyle choices, including:  Daily care of your teeth and gums.  Regular physical activity.  Eating a healthy diet.  Avoiding tobacco and drug use.  Limiting alcohol use.  Practicing safe sex.  Taking low doses of aspirin every day.  Taking vitamin and mineral supplements as recommended by your health care provider. What happens during an annual well check? The services and screenings done by your health care provider during your annual well check will depend on your age, overall health, lifestyle risk factors, and family history of disease. Counseling  Your health care provider may ask you questions about your:  Alcohol use.  Tobacco use.  Drug use.  Emotional well-being.  Home and  relationship well-being.  Sexual activity.  Eating habits.  History of falls.  Memory and ability to understand (cognition).  Work and work Statistician. Screening  You may have the following tests or measurements:  Height, weight, and BMI.  Blood pressure.  Lipid and cholesterol levels. These may be checked every 5 years, or more frequently if you are over 55 years old.  Skin check.  Lung cancer screening. You may have this screening every year starting at age 67 if you have a 30-pack-year history of smoking and currently smoke or have quit within the past 15 years.  Fecal occult blood test (FOBT) of the stool. You may have this test every year starting at age 67.  Flexible sigmoidoscopy or colonoscopy. You may have a sigmoidoscopy every 5 years or a colonoscopy every 10 years starting at age 25.  Prostate cancer screening. Recommendations will vary depending on your family history and other risks.  Hepatitis C blood test.  Hepatitis B blood test.  Sexually transmitted disease (STD) testing.  Diabetes screening. This is done by checking your blood sugar (glucose) after you have not eaten for a while (fasting). You may have this done every 1-3 years.  Abdominal aortic aneurysm (AAA) screening. You may need this if you are a current or former smoker.  Osteoporosis. You may be screened starting at age 59 if you are at high risk. Talk with your health care provider about your test results, treatment options, and if necessary, the need for more tests. Vaccines  Your health care provider may recommend certain vaccines, such as:  Influenza vaccine. This is recommended every year.  Tetanus, diphtheria, and acellular pertussis (Tdap,  Td) vaccine. You may need a Td booster every 10 years.  Zoster vaccine. You may need this after age 20.  Pneumococcal 13-valent conjugate (PCV13) vaccine. One dose is recommended after age 70.  Pneumococcal polysaccharide (PPSV23) vaccine. One  dose is recommended after age 52. Talk to your health care provider about which screenings and vaccines you need and how often you need them. This information is not intended to replace advice given to you by your health care provider. Make sure you discuss any questions you have with your health care provider. Document Released: 08/07/2015 Document Revised: 03/30/2016 Document Reviewed: 05/12/2015 Elsevier Interactive Patient Education  2017 [place] Prevention in the Home Falls can cause injuries. They can happen to people of all ages. There are many things you can do to make your home safe and to help prevent falls. What can I do on the outside of my home?  Regularly fix the edges of walkways and driveways and fix any cracks.  Remove anything that might make you trip as you walk through a door, such as a raised step or threshold.  Trim any bushes or trees on the path to your home.  Use bright outdoor lighting.  Clear any walking paths of anything that might make someone trip, such as rocks or tools.  Regularly check to see if handrails are loose or broken. Make sure that both sides of any steps have handrails.  Any raised decks and porches should have guardrails on the edges.  Have any leaves, snow, or ice cleared regularly.  Use sand or salt on walking paths during winter.  Clean up any spills in your garage right away. This includes oil or grease spills. What can I do in the bathroom?  Use night lights.  Install grab bars by the toilet and in the tub and shower. Do not use towel bars as grab bars.  Use non-skid mats or decals in the tub or shower.  If you need to sit down in the shower, use a plastic, non-slip stool.  Keep the floor dry. Clean up any water that spills on the floor as soon as it happens.  Remove soap buildup in the tub or shower regularly.  Attach bath mats securely with double-sided non-slip rug tape.  Do not have throw rugs and other  things on the floor that can make you trip. What can I do in the bedroom?  Use night lights.  Make sure that you have a light by your bed that is easy to reach.  Do not use any sheets or blankets that are too big for your bed. They should not hang down onto the floor.  Have a firm chair that has side arms. You can use this for support while you get dressed.  Do not have throw rugs and other things on the floor that can make you trip. What can I do in the kitchen?  Clean up any spills right away.  Avoid walking on wet floors.  Keep items that you use a lot in easy-to-reach places.  If you need to reach something above you, use a strong step stool that has a grab bar.  Keep electrical cords out of the way.  Do not use floor polish or wax that makes floors slippery. If you must use wax, use non-skid floor wax.  Do not have throw rugs and other things on the floor that can make you trip. What can I do with my stairs?  Do not  leave any items on the stairs.  Make sure that there are handrails on both sides of the stairs and use them. Fix handrails that are broken or loose. Make sure that handrails are as long as the stairways.  Check any carpeting to make sure that it is firmly attached to the stairs. Fix any carpet that is loose or worn.  Avoid having throw rugs at the top or bottom of the stairs. If you do have throw rugs, attach them to the floor with carpet tape.  Make sure that you have a light switch at the top of the stairs and the bottom of the stairs. If you do not have them, ask someone to add them for you. What else can I do to help prevent falls?  Wear shoes that:  Do not have high heels.  Have rubber bottoms.  Are comfortable and fit you well.  Are closed at the toe. Do not wear sandals.  If you use a stepladder:  Make sure that it is fully opened. Do not climb a closed stepladder.  Make sure that both sides of the stepladder are locked into place.  Ask  someone to hold it for you, if possible.  Clearly mark and make sure that you can see:  Any grab bars or handrails.  First and last steps.  Where the edge of each step is.  Use tools that help you move around (mobility aids) if they are needed. These include:  Canes.  Walkers.  Scooters.  Crutches.  Turn on the lights when you go into a dark area. Replace any light bulbs as soon as they burn out.  Set up your furniture so you have a clear path. Avoid moving your furniture around.  If any of your floors are uneven, fix them.  If there are any pets around you, be aware of where they are.  Review your medicines with your doctor. Some medicines can make you feel dizzy. This can increase your chance of falling. Ask your doctor what other things that you can do to help prevent falls. This information is not intended to replace advice given to you by your health care provider. Make sure you discuss any questions you have with your health care provider. Document Released: 05/07/2009 Document Revised: 12/17/2015 Document Reviewed: 08/15/2014 Elsevier Interactive Patient Education  2017 [employer].

## 2017-05-24 ENCOUNTER — Encounter (HOSPITAL_COMMUNITY)
Admission: RE | Admit: 2017-05-24 | Discharge: 2017-05-24 | Disposition: A | Discharge status: Home / Self Care | Payer: Medicare HMO | Source: Ambulatory Visit | Attending: Hematology | Admitting: Hematology

## 2017-05-24 DIAGNOSIS — C8338 Diffuse large B-cell lymphoma, lymph nodes of multiple sites: Secondary | ICD-10-CM | POA: Diagnosis not present

## 2017-05-24 DIAGNOSIS — C8590 Non-Hodgkin lymphoma, unspecified, unspecified site: Secondary | ICD-10-CM | POA: Diagnosis not present

## 2017-05-24 LAB — GLUCOSE, CAPILLARY: Glucose-Capillary: 101 mg/dL — ABNORMAL HIGH (ref 65–99)

## 2017-05-24 MED ORDER — FLUDEOXYGLUCOSE F - 18 (FDG) INJECTION
7.7100 | Freq: Once | INTRAVENOUS | Status: AC | PRN
  Administered 2017-05-24: 7.71 via INTRAVENOUS

## 2017-05-24 MED ORDER — FLUDEOXYGLUCOSE F - 18 (FDG) INJECTION: 820.0000 | Freq: Once | INTRAVENOUS | Status: DC | PRN

## 2017-05-31 ENCOUNTER — Encounter: Payer: Self-pay | Admitting: Adult Health

## 2017-05-31 ENCOUNTER — Non-Acute Institutional Stay (SKILLED_NURSING_FACILITY): Payer: Medicare HMO | Admitting: Adult Health

## 2017-05-31 DIAGNOSIS — C8588 Other specified types of non-Hodgkin lymphoma, lymph nodes of multiple sites: Secondary | ICD-10-CM | POA: Diagnosis not present

## 2017-05-31 DIAGNOSIS — M1A9XX0 Chronic gout, unspecified, without tophus (tophi): Secondary | ICD-10-CM | POA: Diagnosis not present

## 2017-05-31 DIAGNOSIS — Z87820 Personal history of traumatic brain injury: Secondary | ICD-10-CM | POA: Diagnosis not present
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
## 2017-06-08 ENCOUNTER — Ambulatory Visit (HOSPITAL_BASED_OUTPATIENT_CLINIC_OR_DEPARTMENT_OTHER): Payer: Medicare HMO | Admitting: Hematology

## 2017-06-08 ENCOUNTER — Ambulatory Visit (HOSPITAL_BASED_OUTPATIENT_CLINIC_OR_DEPARTMENT_OTHER): Payer: Medicare HMO

## 2017-06-08 ENCOUNTER — Ambulatory Visit: Payer: Medicare HMO

## 2017-06-08 ENCOUNTER — Telehealth: Payer: Self-pay | Admitting: Hematology

## 2017-06-08 ENCOUNTER — Other Ambulatory Visit (HOSPITAL_BASED_OUTPATIENT_CLINIC_OR_DEPARTMENT_OTHER): Payer: Medicare HMO

## 2017-06-08 VITALS — BP 139/77 | HR 67 | Temp 97.9°F | Resp 17 | Ht 72.0 in | Wt 163.7 lb

## 2017-06-08 VITALS — BP 126/87 | HR 80 | Temp 98.0°F | Resp 17

## 2017-06-08 DIAGNOSIS — C8333 Diffuse large B-cell lymphoma, intra-abdominal lymph nodes: Secondary | ICD-10-CM

## 2017-06-08 DIAGNOSIS — C8588 Other specified types of non-Hodgkin lymphoma, lymph nodes of multiple sites: Secondary | ICD-10-CM

## 2017-06-08 DIAGNOSIS — Z5111 Encounter for antineoplastic chemotherapy: Secondary | ICD-10-CM | POA: Diagnosis not present

## 2017-06-08 DIAGNOSIS — Z5112 Encounter for antineoplastic immunotherapy: Secondary | ICD-10-CM | POA: Diagnosis not present

## 2017-06-08 DIAGNOSIS — C8338 Diffuse large B-cell lymphoma, lymph nodes of multiple sites: Secondary | ICD-10-CM

## 2017-06-08 LAB — COMPREHENSIVE METABOLIC PANEL
ALBUMIN: 3.3 g/dL — AB (ref 3.5–5.0)
ALK PHOS: 74 U/L (ref 40–150)
ALT: 12 U/L (ref 0–55)
AST: 16 U/L (ref 5–34)
Anion Gap: 6 mEq/L (ref 3–11)
BUN: 10.7 mg/dL (ref 7.0–26.0)
CALCIUM: 8.7 mg/dL (ref 8.4–10.4)
CHLORIDE: 102 meq/L (ref 98–109)
CO2: 25 mEq/L (ref 22–29)
CREATININE: 0.8 mg/dL (ref 0.7–1.3)
EGFR: >60 mL/min/{1.73_m2} (ref >60)
Glucose: 89 mg/dl (ref 70–140)
Potassium: 4 mEq/L (ref 3.5–5.1)
Sodium: 133 mEq/L — ABNORMAL LOW (ref 136–145)
Total Bilirubin: 0.29 mg/dL (ref 0.20–1.20)
Total Protein: 5.9 g/dL — ABNORMAL LOW (ref 6.4–8.3)

## 2017-06-08 LAB — CBC & DIFF AND RETIC
BASO%: 0.5 % (ref 0.0–2.0)
Basophils Absolute: 0 10*3/uL (ref 0.0–0.1)
EOS ABS: 0.1 10*3/uL (ref 0.0–0.5)
EOS%: 0.8 % (ref 0.0–7.0)
HCT: 32.4 % — ABNORMAL LOW (ref 38.4–49.9)
HGB: 10.7 g/dL — ABNORMAL LOW (ref 13.0–17.1)
Immature Retic Fract: 3.4 % (ref 3.00–10.60)
LYMPH%: 14.9 % (ref 14.0–49.0)
MCH: 34 pg — AB (ref 27.2–33.4)
MCHC: 33 g/dL (ref 32.0–36.0)
MCV: 102.9 fL — AB (ref 79.3–98.0)
MONO#: 1.4 10*3/uL — ABNORMAL HIGH (ref 0.1–0.9)
MONO%: 22.7 % — AB (ref 0.0–14.0)
NEUT%: 61.1 % (ref 39.0–75.0)
NEUTROS ABS: 3.8 10*3/uL (ref 1.5–6.5)
Platelets: 295 10*3/uL (ref 140–400)
RBC: 3.15 10*6/uL — AB (ref 4.20–5.82)
RDW: 18.9 % — AB (ref 11.0–14.6)
RETIC %: 3.2 % — AB (ref 0.80–1.80)
Retic Ct Abs: 100.8 10*3/uL — ABNORMAL HIGH (ref 34.80–93.90)
WBC: 6.2 10*3/uL (ref 4.0–10.3)
lymph#: 0.9 10*3/uL (ref 0.9–3.3)

## 2017-06-08 MED ORDER — DIPHENHYDRAMINE HCL 25 MG PO CAPS
50.0000 mg | ORAL_CAPSULE | Freq: Once | ORAL | Status: AC
  Administered 2017-06-08: 50 mg via ORAL

## 2017-06-08 MED ORDER — DEXAMETHASONE SODIUM PHOSPHATE 10 MG/ML IJ SOLN
10.0000 mg | Freq: Once | INTRAMUSCULAR | Status: AC
  Administered 2017-06-08: 10 mg via INTRAVENOUS

## 2017-06-08 MED ORDER — PALONOSETRON HCL INJECTION 0.25 MG/5ML
INTRAVENOUS | Status: AC
  Filled 2017-06-08: qty 5

## 2017-06-08 MED ORDER — SODIUM CHLORIDE 0.9 % IV SOLN
375.0000 mg/m2 | Freq: Once | INTRAVENOUS | Status: AC
  Administered 2017-06-08: 700 mg via INTRAVENOUS
  Filled 2017-06-08: qty 20

## 2017-06-08 MED ORDER — SODIUM CHLORIDE 0.9 % IV SOLN
Freq: Once | INTRAVENOUS | Status: AC
  Administered 2017-06-08: 12:00:00 via INTRAVENOUS

## 2017-06-08 MED ORDER — HEPARIN SOD (PORK) LOCK FLUSH 100 UNIT/ML IV SOLN
500.0000 [IU] | Freq: Once | INTRAVENOUS | Status: AC | PRN
  Administered 2017-06-08: 500 [IU]
  Filled 2017-06-08: qty 5

## 2017-06-08 MED ORDER — DEXAMETHASONE SODIUM PHOSPHATE 10 MG/ML IJ SOLN
INTRAMUSCULAR | Status: AC
  Filled 2017-06-08: qty 1

## 2017-06-08 MED ORDER — DOXORUBICIN HCL CHEMO IV INJECTION 2 MG/ML
50.0000 mg/m2 | Freq: Once | INTRAVENOUS | Status: AC
  Administered 2017-06-08: 98 mg via INTRAVENOUS
  Filled 2017-06-08: qty 49

## 2017-06-08 MED ORDER — PALONOSETRON HCL INJECTION 0.25 MG/5ML
0.2500 mg | Freq: Once | INTRAVENOUS | Status: AC
  Administered 2017-06-08: 0.25 mg via INTRAVENOUS

## 2017-06-08 MED ORDER — ACETAMINOPHEN 325 MG PO TABS
650.0000 mg | ORAL_TABLET | Freq: Once | ORAL | Status: AC
  Administered 2017-06-08: 650 mg via ORAL

## 2017-06-08 MED ORDER — ACETAMINOPHEN 325 MG PO TABS
ORAL_TABLET | ORAL | Status: AC
  Filled 2017-06-08: qty 2

## 2017-06-08 MED ORDER — DIPHENHYDRAMINE HCL 25 MG PO CAPS
ORAL_CAPSULE | ORAL | Status: AC
  Filled 2017-06-08: qty 2

## 2017-06-08 MED ORDER — SODIUM CHLORIDE 0.9% FLUSH
10.0000 mL | INTRAVENOUS | Status: DC | PRN
  Administered 2017-06-08: 10 mL
  Filled 2017-06-08: qty 10

## 2017-06-08 MED ORDER — VINCRISTINE SULFATE CHEMO INJECTION 1 MG/ML
2.0000 mg | Freq: Once | INTRAVENOUS | Status: AC
  Administered 2017-06-08: 2 mg via INTRAVENOUS
  Filled 2017-06-08: qty 2

## 2017-06-08 MED ORDER — CYCLOPHOSPHAMIDE CHEMO INJECTION 1 GM
750.0000 mg/m2 | Freq: Once | INTRAMUSCULAR | Status: AC
  Administered 2017-06-08: 1460 mg via INTRAVENOUS
  Filled 2017-06-08: qty 73
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
## 2017-06-08 NOTE — Patient Instructions (Signed)
[facility] Discharge Instructions for Patients Receiving Chemotherapy  Today you received the following chemotherapy agents Rituxan, Adriamycin, Vincristine, and Cytoxan  To help prevent nausea and vomiting after your treatment, we encourage you to take your nausea medication as directed   If you develop nausea and vomiting that is not controlled by your nausea medication, call the clinic.   BELOW ARE SYMPTOMS THAT SHOULD BE REPORTED IMMEDIATELY:  *FEVER GREATER THAN 100.5 F  *CHILLS WITH OR WITHOUT FEVER  NAUSEA AND VOMITING THAT IS NOT CONTROLLED WITH YOUR NAUSEA MEDICATION  *UNUSUAL SHORTNESS OF BREATH  *UNUSUAL BRUISING OR BLEEDING  TENDERNESS IN MOUTH AND THROAT WITH OR WITHOUT PRESENCE OF ULCERS  *URINARY PROBLEMS  *BOWEL PROBLEMS  UNUSUAL RASH Items with * indicate a potential emergency and should be followed up as soon as possible.  Feel free to call the clinic should you have any questions or concerns. The clinic phone number is [phone].  Please show the CHEMO ALERT CARD at check-in to the Emergency Department and triage nurse.   

## 2017-06-08 NOTE — Telephone Encounter (Signed)
Scheduled appt per 11/14 sch msg - patient's phone did not have a voicemail set up.

## 2017-06-10 ENCOUNTER — Ambulatory Visit (HOSPITAL_BASED_OUTPATIENT_CLINIC_OR_DEPARTMENT_OTHER): Payer: Medicare HMO

## 2017-06-10 ENCOUNTER — Telehealth: Payer: Self-pay | Admitting: Hematology

## 2017-06-10 VITALS — BP 124/76 | HR 75 | Temp 97.7°F | Resp 17

## 2017-06-10 DIAGNOSIS — Z5189 Encounter for other specified aftercare: Secondary | ICD-10-CM

## 2017-06-10 DIAGNOSIS — C8333 Diffuse large B-cell lymphoma, intra-abdominal lymph nodes: Secondary | ICD-10-CM

## 2017-06-10 DIAGNOSIS — C8588 Other specified types of non-Hodgkin lymphoma, lymph nodes of multiple sites: Secondary | ICD-10-CM

## 2017-06-10 MED ORDER — PEGFILGRASTIM INJECTION 6 MG/0.6ML ~~LOC~~
6.0000 mg | PREFILLED_SYRINGE | Freq: Once | SUBCUTANEOUS | Status: AC
  Administered 2017-06-10: 6 mg via SUBCUTANEOUS

## 2017-06-10 NOTE — Patient Instructions (Signed)
Pegfilgrastim injection (neulasta) What is this medicine? PEGFILGRASTIM (PEG fil gra stim) is a long-acting granulocyte colony-stimulating factor that stimulates the growth of neutrophils, a type of white blood cell important in the body's fight against infection. It is used to reduce the incidence of fever and infection in patients with certain types of cancer who are receiving chemotherapy that affects the bone marrow, and to increase survival after being exposed to high doses of radiation. This medicine may be used for other purposes; ask your health care provider or pharmacist if you have questions. COMMON BRAND NAME(S): Neulasta What should I tell my health care provider before I take this medicine? They need to know if you have any of these conditions: -kidney disease -latex allergy -ongoing radiation therapy -sickle cell disease -skin reactions to acrylic adhesives (On-Body Injector only) -an unusual or allergic reaction to pegfilgrastim, filgrastim, other medicines, foods, dyes, or preservatives -pregnant or trying to get pregnant -breast-feeding How should I use this medicine? This medicine is for injection under the skin. If you get this medicine at home, you will be taught how to prepare and give the pre-filled syringe or how to use the On-body Injector. Refer to the patient Instructions for Use for detailed instructions. Use exactly as directed. Tell your healthcare provider immediately if you suspect that the On-body Injector may not have performed as intended or if you suspect the use of the On-body Injector resulted in a missed or partial dose. It is important that you put your used needles and syringes in a special sharps container. Do not put them in a trash can. If you do not have a sharps container, call your pharmacist or healthcare provider to get one. Talk to your pediatrician regarding the use of this medicine in children. While this drug may be prescribed for selected  conditions, precautions do apply. Overdosage: If you think you have taken too much of this medicine contact a poison control center or emergency room at once. NOTE: This medicine is only for you. Do not share this medicine with others. What if I miss a dose? It is important not to miss your dose. Call your doctor or health care professional if you miss your dose. If you miss a dose due to an On-body Injector failure or leakage, a new dose should be administered as soon as possible using a single prefilled syringe for manual use. What may interact with this medicine? Interactions have not been studied. Give your health care provider a list of all the medicines, herbs, non-prescription drugs, or dietary supplements you use. Also tell them if you smoke, drink alcohol, or use illegal drugs. Some items may interact with your medicine. This list may not describe all possible interactions. Give your health care provider a list of all the medicines, herbs, non-prescription drugs, or dietary supplements you use. Also tell them if you smoke, drink alcohol, or use illegal drugs. Some items may interact with your medicine. What should I watch for while using this medicine? You may need blood work done while you are taking this medicine. If you are going to need a MRI, CT scan, or other procedure, tell your doctor that you are using this medicine (On-Body Injector only). What side effects may I notice from receiving this medicine? Side effects that you should report to your doctor or health care professional as soon as possible: -allergic reactions like skin rash, itching or hives, swelling of the face, lips, or tongue -dizziness -fever -pain, redness, or irritation at  site where injected -pinpoint red spots on the skin -red or dark-brown urine -shortness of breath or breathing problems -stomach or side pain, or pain at the shoulder -swelling -tiredness -trouble passing urine or change in the amount of  urine Side effects that usually do not require medical attention (report to your doctor or health care professional if they continue or are bothersome): -bone pain -muscle pain This list may not describe all possible side effects. Call your doctor for medical advice about side effects. You may report side effects to FDA at 1-800-FDA-1088. Where should I keep my medicine? Keep out of the reach of children. Store pre-filled syringes in a refrigerator between 2 and 8 degrees C (36 and 46 degrees F). Do not freeze. Keep in carton to protect from light. Throw away this medicine if it is left out of the refrigerator for more than 48 hours. Throw away any unused medicine after the expiration date. NOTE: This sheet is a summary. It may not cover all possible information. If you have questions about this medicine, talk to your doctor, pharmacist, or health care provider.  2018 Elsevier/Gold Standard (2016-07-07 12:58:03)  

## 2017-06-10 NOTE — Telephone Encounter (Signed)
Left message for patient regarding appts added per 11/17 sch msg.

## 2017-06-16 ENCOUNTER — Telehealth: Payer: Self-pay | Admitting: Hematology

## 2017-06-16 NOTE — Telephone Encounter (Signed)
Per 11/15 los - all appts were scheduled. Added 12/27 appointments - sent a message to Dr.Kale regarding his f/u on that day due to him being out of the office. Will contact patient when they respond back to my message.

## 2017-06-27 ENCOUNTER — Non-Acute Institutional Stay (SKILLED_NURSING_FACILITY): Payer: Medicare HMO | Admitting: Adult Health

## 2017-06-27 ENCOUNTER — Encounter: Payer: Self-pay | Admitting: Adult Health

## 2017-06-27 DIAGNOSIS — M1A9XX0 Chronic gout, unspecified, without tophus (tophi): Secondary | ICD-10-CM | POA: Diagnosis not present

## 2017-06-27 DIAGNOSIS — D638 Anemia in other chronic diseases classified elsewhere: Secondary | ICD-10-CM | POA: Diagnosis not present

## 2017-06-27 DIAGNOSIS — C8588 Other specified types of non-Hodgkin lymphoma, lymph nodes of multiple sites: Secondary | ICD-10-CM

## 2017-06-27 DIAGNOSIS — Z87820 Personal history of traumatic brain injury: Secondary | ICD-10-CM
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
## 2017-06-29 ENCOUNTER — Encounter: Payer: Self-pay | Admitting: Hematology

## 2017-06-29 ENCOUNTER — Other Ambulatory Visit (HOSPITAL_BASED_OUTPATIENT_CLINIC_OR_DEPARTMENT_OTHER): Payer: Medicare HMO

## 2017-06-29 ENCOUNTER — Ambulatory Visit (HOSPITAL_BASED_OUTPATIENT_CLINIC_OR_DEPARTMENT_OTHER): Payer: Medicare HMO

## 2017-06-29 ENCOUNTER — Ambulatory Visit: Payer: Medicare HMO

## 2017-06-29 ENCOUNTER — Ambulatory Visit (HOSPITAL_BASED_OUTPATIENT_CLINIC_OR_DEPARTMENT_OTHER): Payer: Medicare HMO | Admitting: Hematology

## 2017-06-29 ENCOUNTER — Telehealth: Payer: Self-pay | Admitting: Hematology

## 2017-06-29 VITALS — BP 107/64 | HR 94 | Temp 97.8°F | Resp 18 | Ht 72.0 in | Wt 161.1 lb

## 2017-06-29 VITALS — BP 114/63 | HR 80 | Temp 97.9°F | Resp 17

## 2017-06-29 DIAGNOSIS — C8588 Other specified types of non-Hodgkin lymphoma, lymph nodes of multiple sites: Secondary | ICD-10-CM

## 2017-06-29 DIAGNOSIS — Z5111 Encounter for antineoplastic chemotherapy: Secondary | ICD-10-CM

## 2017-06-29 DIAGNOSIS — C8333 Diffuse large B-cell lymphoma, intra-abdominal lymph nodes: Secondary | ICD-10-CM

## 2017-06-29 DIAGNOSIS — C8338 Diffuse large B-cell lymphoma, lymph nodes of multiple sites: Secondary | ICD-10-CM

## 2017-06-29 DIAGNOSIS — Z95828 Presence of other vascular implants and grafts: Secondary | ICD-10-CM | POA: Insufficient documentation

## 2017-06-29 DIAGNOSIS — Z5112 Encounter for antineoplastic immunotherapy: Secondary | ICD-10-CM

## 2017-06-29 LAB — CBC & DIFF AND RETIC
BASO%: 0.3 % (ref 0.0–2.0)
Basophils Absolute: 0 10*3/uL (ref 0.0–0.1)
EOS%: 0.6 % (ref 0.0–7.0)
Eosinophils Absolute: 0 10*3/uL (ref 0.0–0.5)
HCT: 35.5 % — ABNORMAL LOW (ref 38.4–49.9)
HEMOGLOBIN: 11.9 g/dL — AB (ref 13.0–17.1)
IMMATURE RETIC FRACT: 3.6 % (ref 3.00–10.60)
LYMPH%: 26.2 % (ref 14.0–49.0)
MCH: 35.4 pg — AB (ref 27.2–33.4)
MCHC: 33.5 g/dL (ref 32.0–36.0)
MCV: 105.7 fL — AB (ref 79.3–98.0)
MONO#: 0.4 10*3/uL (ref 0.1–0.9)
MONO%: 5.9 % (ref 0.0–14.0)
NEUT%: 67 % (ref 39.0–75.0)
NEUTROS ABS: 4.2 10*3/uL (ref 1.5–6.5)
PLATELETS: 269 10*3/uL (ref 140–400)
RBC: 3.36 10*6/uL — AB (ref 4.20–5.82)
RDW: 16.4 % — ABNORMAL HIGH (ref 11.0–14.6)
Retic %: 2.33 % — ABNORMAL HIGH (ref 0.80–1.80)
Retic Ct Abs: 78.29 10*3/uL (ref 34.80–93.90)
WBC: 6.3 10*3/uL (ref 4.0–10.3)
lymph#: 1.6 10*3/uL (ref 0.9–3.3)

## 2017-06-29 LAB — COMPREHENSIVE METABOLIC PANEL
ALBUMIN: 3.4 g/dL — AB (ref 3.5–5.0)
ALK PHOS: 86 U/L (ref 40–150)
ALT: 16 U/L (ref 0–55)
ANION GAP: 9 meq/L (ref 3–11)
AST: 19 U/L (ref 5–34)
BILIRUBIN TOTAL: 0.32 mg/dL (ref 0.20–1.20)
BUN: 14.8 mg/dL (ref 7.0–26.0)
CO2: 24 mEq/L (ref 22–29)
Calcium: 8.8 mg/dL (ref 8.4–10.4)
Chloride: 103 mEq/L (ref 98–109)
Creatinine: 0.8 mg/dL (ref 0.7–1.3)
GLUCOSE: 130 mg/dL (ref 70–140)
POTASSIUM: 4.1 meq/L (ref 3.5–5.1)
SODIUM: 136 meq/L (ref 136–145)
TOTAL PROTEIN: 6 g/dL — AB (ref 6.4–8.3)

## 2017-06-29 LAB — LACTATE DEHYDROGENASE: LDH: 154 U/L (ref 125–245)

## 2017-06-29 MED ORDER — SODIUM CHLORIDE 0.9 % IV SOLN
Freq: Once | INTRAVENOUS | Status: AC
  Administered 2017-06-29: 10:00:00 via INTRAVENOUS

## 2017-06-29 MED ORDER — PALONOSETRON HCL INJECTION 0.25 MG/5ML
INTRAVENOUS | Status: AC
  Filled 2017-06-29: qty 5

## 2017-06-29 MED ORDER — DEXAMETHASONE SODIUM PHOSPHATE 10 MG/ML IJ SOLN
10.0000 mg | Freq: Once | INTRAMUSCULAR | Status: AC
  Administered 2017-06-29: 10 mg via INTRAVENOUS

## 2017-06-29 MED ORDER — SODIUM CHLORIDE 0.9 % IV SOLN
750.0000 mg/m2 | Freq: Once | INTRAVENOUS | Status: AC
  Administered 2017-06-29: 1460 mg via INTRAVENOUS
  Filled 2017-06-29: qty 73

## 2017-06-29 MED ORDER — SODIUM CHLORIDE 0.9 % IV SOLN
375.0000 mg/m2 | Freq: Once | INTRAVENOUS | Status: AC
  Administered 2017-06-29: 700 mg via INTRAVENOUS
  Filled 2017-06-29: qty 20

## 2017-06-29 MED ORDER — DEXAMETHASONE SODIUM PHOSPHATE 10 MG/ML IJ SOLN
INTRAMUSCULAR | Status: AC
  Filled 2017-06-29: qty 1

## 2017-06-29 MED ORDER — VINCRISTINE SULFATE CHEMO INJECTION 1 MG/ML
2.0000 mg | Freq: Once | INTRAVENOUS | Status: AC
  Administered 2017-06-29: 2 mg via INTRAVENOUS
  Filled 2017-06-29: qty 2

## 2017-06-29 MED ORDER — SODIUM CHLORIDE 0.9% FLUSH
10.0000 mL | INTRAVENOUS | Status: DC | PRN
  Administered 2017-06-29: 10 mL
  Filled 2017-06-29: qty 10

## 2017-06-29 MED ORDER — PALONOSETRON HCL INJECTION 0.25 MG/5ML
0.2500 mg | Freq: Once | INTRAVENOUS | Status: AC
  Administered 2017-06-29: 0.25 mg via INTRAVENOUS

## 2017-06-29 MED ORDER — DIPHENHYDRAMINE HCL 25 MG PO CAPS
ORAL_CAPSULE | ORAL | Status: AC
  Filled 2017-06-29: qty 2

## 2017-06-29 MED ORDER — DOXORUBICIN HCL CHEMO IV INJECTION 2 MG/ML
50.0000 mg/m2 | Freq: Once | INTRAVENOUS | Status: AC
  Administered 2017-06-29: 98 mg via INTRAVENOUS
  Filled 2017-06-29: qty 49

## 2017-06-29 MED ORDER — SODIUM CHLORIDE 0.9% FLUSH
10.0000 mL | INTRAVENOUS | Status: DC | PRN
  Administered 2017-06-29: 10 mL via INTRAVENOUS
  Filled 2017-06-29: qty 10

## 2017-06-29 MED ORDER — ACETAMINOPHEN 325 MG PO TABS
ORAL_TABLET | ORAL | Status: AC
  Filled 2017-06-29: qty 2

## 2017-06-29 MED ORDER — ACETAMINOPHEN 325 MG PO TABS
650.0000 mg | ORAL_TABLET | Freq: Once | ORAL | Status: AC
  Administered 2017-06-29: 650 mg via ORAL

## 2017-06-29 MED ORDER — DIPHENHYDRAMINE HCL 25 MG PO CAPS
50.0000 mg | ORAL_CAPSULE | Freq: Once | ORAL | Status: AC
  Administered 2017-06-29: 50 mg via ORAL

## 2017-06-29 MED ORDER — HEPARIN SOD (PORK) LOCK FLUSH 100 UNIT/ML IV SOLN
500.0000 [IU] | Freq: Once | INTRAVENOUS | Status: AC | PRN
  Administered 2017-06-29: 500 [IU]
  Filled 2017-06-29: qty 5

## 2017-06-29 NOTE — Progress Notes (Signed)
Blood return noted before, every 3mls and after Adriamycin push and before and after Vincristine infusion.  

## 2017-06-29 NOTE — Patient Instructions (Signed)
Thank you for choosing [facility] to provide your oncology and hematology care.  To afford each patient quality time with our providers, please arrive 30 minutes before your scheduled appointment time.  If you arrive late for your appointment, you may be asked to reschedule.  We strive to give you quality time with our providers, and arriving late affects you and other patients whose appointments are after yours.  If you are a no show for multiple scheduled visits, you may be dismissed from the clinic at the providers discretion.   Again, thank you for choosing [facility], our hope is that these requests will decrease the amount of time that you wait before being seen by our physicians.  ______________________________________________________________________ Should you have questions after your visit to the [facility], please contact our office at [phone] between the hours of 8:30 and 4:30 p.m.    Voicemails left after 4:30p.m will not be returned until the following business day.   For prescription refill requests, please have your pharmacy contact us directly.  Please also try to allow 48 hours for prescription requests.   Please contact the scheduling department for questions regarding scheduling.  For scheduling of procedures such as PET scans, CT scans, MRI, Ultrasound, etc please contact central scheduling at [phone].   Resources For Cancer Patients and Caregivers:  American Cancer Society:  800-227-2345  Can help patients locate various types of support and financial assistance Cancer Care: 1-800-813-HOPE (4673) Provides financial assistance, online support groups, medication/co-pay assistance.   [place] DSS:  336-641-3447 Where to apply for food stamps, Medicaid, and utility assistance Medicare Rights Center: 800-333-4114 Helps people with Medicare understand their rights and benefits, navigate the Medicare system, and secure the  quality healthcare they deserve SCAT: 336-333-6589 [place] [employer]'s shared-ride transportation service for eligible riders who have a disability that prevents them from riding the fixed route bus.   For additional information on assistance programs please contact our social worker:   Grier Hock/Abigail Elmore:  [phone] 
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
## 2017-06-29 NOTE — Patient Instructions (Signed)
[facility] Discharge Instructions for Patients Receiving Chemotherapy  Today you received the following chemotherapy agents Rituxan, Adriamycin, Oncovin, Cytoxan  To help prevent nausea and vomiting after your treatment, we encourage you to take your nausea medication as directed   If you develop nausea and vomiting that is not controlled by your nausea medication, call the clinic.   BELOW ARE SYMPTOMS THAT SHOULD BE REPORTED IMMEDIATELY:  *FEVER GREATER THAN 100.5 F  *CHILLS WITH OR WITHOUT FEVER  NAUSEA AND VOMITING THAT IS NOT CONTROLLED WITH YOUR NAUSEA MEDICATION  *UNUSUAL SHORTNESS OF BREATH  *UNUSUAL BRUISING OR BLEEDING  TENDERNESS IN MOUTH AND THROAT WITH OR WITHOUT PRESENCE OF ULCERS  *URINARY PROBLEMS  *BOWEL PROBLEMS  UNUSUAL RASH Items with * indicate a potential emergency and should be followed up as soon as possible.  Feel free to call the clinic should you have any questions or concerns. The clinic phone number is [phone].  Please show the [place] at check-in to the Emergency Department and triage nurse.

## 2017-06-29 NOTE — Telephone Encounter (Signed)
Gave avs and calendar for December  °

## 2017-07-01 ENCOUNTER — Ambulatory Visit: Payer: Medicare HMO

## 2017-07-01 ENCOUNTER — Ambulatory Visit (HOSPITAL_BASED_OUTPATIENT_CLINIC_OR_DEPARTMENT_OTHER): Payer: Medicare HMO

## 2017-07-01 VITALS — BP 105/69 | HR 102 | Temp 98.1°F | Resp 18

## 2017-07-01 DIAGNOSIS — Z5189 Encounter for other specified aftercare: Secondary | ICD-10-CM | POA: Diagnosis not present

## 2017-07-01 DIAGNOSIS — C8333 Diffuse large B-cell lymphoma, intra-abdominal lymph nodes: Secondary | ICD-10-CM | POA: Diagnosis not present

## 2017-07-01 DIAGNOSIS — C8588 Other specified types of non-Hodgkin lymphoma, lymph nodes of multiple sites: Secondary | ICD-10-CM

## 2017-07-01 MED ORDER — PEGFILGRASTIM INJECTION 6 MG/0.6ML ~~LOC~~
6.0000 mg | PREFILLED_SYRINGE | Freq: Once | SUBCUTANEOUS | Status: AC
  Administered 2017-07-01: 6 mg via SUBCUTANEOUS

## 2017-07-01 NOTE — Patient Instructions (Signed)
Pegfilgrastim injection What is this medicine? PEGFILGRASTIM (PEG fil gra stim) is a long-acting granulocyte colony-stimulating factor that stimulates the growth of neutrophils, a type of white blood cell important in the body's fight against infection. It is used to reduce the incidence of fever and infection in patients with certain types of cancer who are receiving chemotherapy that affects the bone marrow, and to increase survival after being exposed to high doses of radiation. This medicine may be used for other purposes; ask your health care provider or pharmacist if you have questions. COMMON BRAND NAME(S): Neulasta What should I tell my health care provider before I take this medicine? They need to know if you have any of these conditions: -kidney disease -latex allergy -ongoing radiation therapy -sickle cell disease -skin reactions to acrylic adhesives (On-Body Injector only) -an unusual or allergic reaction to pegfilgrastim, filgrastim, other medicines, foods, dyes, or preservatives -pregnant or trying to get pregnant -breast-feeding How should I use this medicine? This medicine is for injection under the skin. If you get this medicine at home, you will be taught how to prepare and give the pre-filled syringe or how to use the On-body Injector. Refer to the patient Instructions for Use for detailed instructions. Use exactly as directed. Tell your healthcare provider immediately if you suspect that the On-body Injector may not have performed as intended or if you suspect the use of the On-body Injector resulted in a missed or partial dose. It is important that you put your used needles and syringes in a special sharps container. Do not put them in a trash can. If you do not have a sharps container, call your pharmacist or healthcare provider to get one. Talk to your pediatrician regarding the use of this medicine in children. While this drug may be prescribed for selected conditions,  precautions do apply. Overdosage: If you think you have taken too much of this medicine contact a poison control center or emergency room at once. NOTE: This medicine is only for you. Do not share this medicine with others. What if I miss a dose? It is important not to miss your dose. Call your doctor or health care professional if you miss your dose. If you miss a dose due to an On-body Injector failure or leakage, a new dose should be administered as soon as possible using a single prefilled syringe for manual use. What may interact with this medicine? Interactions have not been studied. Give your health care provider a list of all the medicines, herbs, non-prescription drugs, or dietary supplements you use. Also tell them if you smoke, drink alcohol, or use illegal drugs. Some items may interact with your medicine. This list may not describe all possible interactions. Give your health care provider a list of all the medicines, herbs, non-prescription drugs, or dietary supplements you use. Also tell them if you smoke, drink alcohol, or use illegal drugs. Some items may interact with your medicine. What should I watch for while using this medicine? You may need blood work done while you are taking this medicine. If you are going to need a MRI, CT scan, or other procedure, tell your doctor that you are using this medicine (On-Body Injector only). What side effects may I notice from receiving this medicine? Side effects that you should report to your doctor or health care professional as soon as possible: -allergic reactions like skin rash, itching or hives, swelling of the face, lips, or tongue -dizziness -fever -pain, redness, or irritation at site   where injected -pinpoint red spots on the skin -red or dark-brown urine -shortness of breath or breathing problems -stomach or side pain, or pain at the shoulder -swelling -tiredness -trouble passing urine or change in the amount of urine Side  effects that usually do not require medical attention (report to your doctor or health care professional if they continue or are bothersome): -bone pain -muscle pain This list may not describe all possible side effects. Call your doctor for medical advice about side effects. You may report side effects to FDA at 1-800-FDA-1088. Where should I keep my medicine? Keep out of the reach of children. Store pre-filled syringes in a refrigerator between 2 and 8 degrees C (36 and 46 degrees F). Do not freeze. Keep in carton to protect from light. Throw away this medicine if it is left out of the refrigerator for more than 48 hours. Throw away any unused medicine after the expiration date. NOTE: This sheet is a summary. It may not cover all possible information. If you have questions about this medicine, talk to your doctor, pharmacist, or health care provider.  2018 Elsevier/Gold Standard (2016-07-07 12:58:03)  

## 2017-07-12 ENCOUNTER — Encounter: Payer: Self-pay | Admitting: Adult Health

## 2017-07-12 ENCOUNTER — Non-Acute Institutional Stay (SKILLED_NURSING_FACILITY): Payer: Medicare HMO | Admitting: Adult Health

## 2017-07-12 DIAGNOSIS — H1031 Unspecified acute conjunctivitis, right eye: Secondary | ICD-10-CM | POA: Diagnosis not present
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
## 2017-07-13 ENCOUNTER — Other Ambulatory Visit: Payer: Self-pay

## 2017-07-13 DIAGNOSIS — C8588 Other specified types of non-Hodgkin lymphoma, lymph nodes of multiple sites: Secondary | ICD-10-CM

## 2017-07-14 ENCOUNTER — Other Ambulatory Visit (HOSPITAL_BASED_OUTPATIENT_CLINIC_OR_DEPARTMENT_OTHER): Payer: Medicare HMO

## 2017-07-14 ENCOUNTER — Encounter: Payer: Self-pay | Admitting: Hematology

## 2017-07-14 ENCOUNTER — Ambulatory Visit (HOSPITAL_BASED_OUTPATIENT_CLINIC_OR_DEPARTMENT_OTHER): Payer: Medicare HMO | Admitting: Hematology

## 2017-07-14 VITALS — BP 121/70 | HR 79 | Temp 97.6°F | Resp 18 | Ht 72.0 in | Wt 164.4 lb

## 2017-07-14 DIAGNOSIS — H00012 Hordeolum externum right lower eyelid: Secondary | ICD-10-CM

## 2017-07-14 DIAGNOSIS — C8333 Diffuse large B-cell lymphoma, intra-abdominal lymph nodes: Secondary | ICD-10-CM | POA: Diagnosis not present

## 2017-07-14 DIAGNOSIS — C8588 Other specified types of non-Hodgkin lymphoma, lymph nodes of multiple sites: Secondary | ICD-10-CM | POA: Diagnosis not present

## 2017-07-14 DIAGNOSIS — S0993XS Unspecified injury of face, sequela: Secondary | ICD-10-CM

## 2017-07-14 LAB — CBC WITH DIFFERENTIAL/PLATELET
BASO%: 0.3 % (ref 0.0–2.0)
Basophils Absolute: 0 10*3/uL (ref 0.0–0.1)
EOS ABS: 0.1 10*3/uL (ref 0.0–0.5)
EOS%: 0.5 % (ref 0.0–7.0)
HEMATOCRIT: 36.5 % — AB (ref 38.4–49.9)
HEMOGLOBIN: 12.1 g/dL — AB (ref 13.0–17.1)
LYMPH#: 0.8 10*3/uL — AB (ref 0.9–3.3)
LYMPH%: 7.8 % — ABNORMAL LOW (ref 14.0–49.0)
MCH: 35.7 pg — ABNORMAL HIGH (ref 27.2–33.4)
MCHC: 33.1 g/dL (ref 32.0–36.0)
MCV: 107.8 fL — AB (ref 79.3–98.0)
MONO#: 1.4 10*3/uL — AB (ref 0.1–0.9)
MONO%: 14.3 % — AB (ref 0.0–14.0)
NEUT%: 77.1 % — ABNORMAL HIGH (ref 39.0–75.0)
NEUTROS ABS: 7.4 10*3/uL — AB (ref 1.5–6.5)
PLATELETS: 299 10*3/uL (ref 140–400)
RBC: 3.39 10*6/uL — ABNORMAL LOW (ref 4.20–5.82)
RDW: 15.8 % — AB (ref 11.0–14.6)
WBC: 9.6 10*3/uL (ref 4.0–10.3)

## 2017-07-14 LAB — COMPREHENSIVE METABOLIC PANEL
ALBUMIN: 3.5 g/dL (ref 3.5–5.0)
ALK PHOS: 111 U/L (ref 40–150)
ALT: 17 U/L (ref 0–55)
ANION GAP: 7 meq/L (ref 3–11)
AST: 17 U/L (ref 5–34)
BUN: 12.8 mg/dL (ref 7.0–26.0)
CALCIUM: 9.2 mg/dL (ref 8.4–10.4)
CHLORIDE: 102 meq/L (ref 98–109)
CO2: 27 mEq/L (ref 22–29)
CREATININE: 0.8 mg/dL (ref 0.7–1.3)
EGFR: >60 mL/min/{1.73_m2} (ref >60)
Glucose: 119 mg/dl (ref 70–140)
POTASSIUM: 4.5 meq/L (ref 3.5–5.1)
Sodium: 136 mEq/L (ref 136–145)
Total Bilirubin: <0.22 mg/dL (ref 0.20–1.20)
Total Protein: 6.3 g/dL — ABNORMAL LOW (ref 6.4–8.3)

## 2017-07-14 LAB — LACTATE DEHYDROGENASE: LDH: 160 U/L (ref 125–245)

## 2017-07-14 MED ORDER — ERYTHROMYCIN 5 MG/GM OP OINT: 1.0000 "application " | TOPICAL_OINTMENT | Freq: Every day | OPHTHALMIC | 0 refills | Status: DC

## 2017-07-14 NOTE — Patient Instructions (Signed)
Thank you for choosing [facility] to provide your oncology and hematology care.  To afford each patient quality time with our providers, please arrive 30 minutes before your scheduled appointment time.  If you arrive late for your appointment, you may be asked to reschedule.  We strive to give you quality time with our providers, and arriving late affects you and other patients whose appointments are after yours.   If you are a no show for multiple scheduled visits, you may be dismissed from the clinic at the providers discretion.    Again, thank you for choosing [facility], our hope is that these requests will decrease the amount of time that you wait before being seen by our physicians.  ______________________________________________________________________  Should you have questions after your visit to the [facility], please contact our office at [phone] between the hours of 8:30 and 4:30 p.m.    Voicemails left after 4:30p.m will not be returned until the following business day.    For prescription refill requests, please have your pharmacy contact us directly.  Please also try to allow 48 hours for prescription requests.    Please contact the scheduling department for questions regarding scheduling.  For scheduling of procedures such as PET scans, CT scans, MRI, Ultrasound, etc please contact central scheduling at [phone].    Resources For Cancer Patients and Caregivers:   Oncolink.org:  A wonderful resource for patients and healthcare providers for information regarding your disease, ways to tract your treatment, what to expect, etc.     American Cancer Society:  800-227-2345  Can help patients locate various types of support and financial assistance  Cancer Care: 1-800-813-HOPE (4673) Provides financial assistance, online support groups, medication/co-pay assistance.    [place] DSS:  336-641-3447 Where to apply for food  stamps, Medicaid, and utility assistance  Medicare Rights Center: 800-333-4114 Helps people with Medicare understand their rights and benefits, navigate the Medicare system, and secure the quality healthcare they deserve  SCAT: 336-333-6589 [place] [employer]'s shared-ride transportation service for eligible riders who have a disability that prevents them from riding the fixed route bus.    For additional information on assistance programs please contact our social worker:   Grier Hock/Abigail Elmore:  [phone]            

## 2017-07-19 ENCOUNTER — Telehealth: Payer: Self-pay | Admitting: Hematology

## 2017-07-19 ENCOUNTER — Other Ambulatory Visit: Payer: Self-pay

## 2017-07-19 DIAGNOSIS — C8588 Other specified types of non-Hodgkin lymphoma, lymph nodes of multiple sites: Secondary | ICD-10-CM

## 2017-07-19 NOTE — Telephone Encounter (Signed)
Spoke to facility scheduler regarding upcoming January 2019 appointments.

## 2017-07-20 ENCOUNTER — Other Ambulatory Visit (HOSPITAL_BASED_OUTPATIENT_CLINIC_OR_DEPARTMENT_OTHER): Payer: Medicare HMO

## 2017-07-20 ENCOUNTER — Ambulatory Visit: Payer: Medicare HMO

## 2017-07-20 ENCOUNTER — Ambulatory Visit (HOSPITAL_BASED_OUTPATIENT_CLINIC_OR_DEPARTMENT_OTHER): Payer: Medicare HMO

## 2017-07-20 VITALS — BP 111/68 | HR 77 | Temp 97.6°F | Resp 18

## 2017-07-20 DIAGNOSIS — C8588 Other specified types of non-Hodgkin lymphoma, lymph nodes of multiple sites: Secondary | ICD-10-CM

## 2017-07-20 DIAGNOSIS — Z5112 Encounter for antineoplastic immunotherapy: Secondary | ICD-10-CM

## 2017-07-20 DIAGNOSIS — Z95828 Presence of other vascular implants and grafts: Secondary | ICD-10-CM

## 2017-07-20 DIAGNOSIS — C8333 Diffuse large B-cell lymphoma, intra-abdominal lymph nodes: Secondary | ICD-10-CM

## 2017-07-20 DIAGNOSIS — Z5111 Encounter for antineoplastic chemotherapy: Secondary | ICD-10-CM

## 2017-07-20 LAB — COMPREHENSIVE METABOLIC PANEL
ALK PHOS: 89 U/L (ref 40–150)
ALT: 17 U/L (ref 0–55)
ANION GAP: 7 meq/L (ref 3–11)
AST: 20 U/L (ref 5–34)
Albumin: 3.4 g/dL — ABNORMAL LOW (ref 3.5–5.0)
BUN: 18.6 mg/dL (ref 7.0–26.0)
CALCIUM: 8.7 mg/dL (ref 8.4–10.4)
CO2: 25 mEq/L (ref 22–29)
Chloride: 103 mEq/L (ref 98–109)
Creatinine: 0.9 mg/dL (ref 0.7–1.3)
Glucose: 92 mg/dl (ref 70–140)
POTASSIUM: 4 meq/L (ref 3.5–5.1)
Sodium: 135 mEq/L — ABNORMAL LOW (ref 136–145)
Total Bilirubin: 0.25 mg/dL (ref 0.20–1.20)
Total Protein: 6.1 g/dL — ABNORMAL LOW (ref 6.4–8.3)

## 2017-07-20 LAB — CBC & DIFF AND RETIC
BASO%: 0.5 % (ref 0.0–2.0)
Basophils Absolute: 0 10*3/uL (ref 0.0–0.1)
EOS ABS: 0 10*3/uL (ref 0.0–0.5)
EOS%: 0.3 % (ref 0.0–7.0)
HCT: 33.9 % — ABNORMAL LOW (ref 38.4–49.9)
HEMOGLOBIN: 11.3 g/dL — AB (ref 13.0–17.1)
IMMATURE RETIC FRACT: 0.9 % — AB (ref 3.00–10.60)
LYMPH#: 1 10*3/uL (ref 0.9–3.3)
LYMPH%: 16.9 % (ref 14.0–49.0)
MCH: 35.4 pg — ABNORMAL HIGH (ref 27.2–33.4)
MCHC: 33.3 g/dL (ref 32.0–36.0)
MCV: 106.3 fL — ABNORMAL HIGH (ref 79.3–98.0)
MONO#: 1.4 10*3/uL — AB (ref 0.1–0.9)
MONO%: 24.8 % — ABNORMAL HIGH (ref 0.0–14.0)
NEUT%: 57.5 % (ref 39.0–75.0)
NEUTROS ABS: 3.3 10*3/uL (ref 1.5–6.5)
Platelets: 234 10*3/uL (ref 140–400)
RBC: 3.19 10*6/uL — ABNORMAL LOW (ref 4.20–5.82)
RDW: 15.3 % — AB (ref 11.0–14.6)
RETIC %: 1.22 % (ref 0.80–1.80)
RETIC CT ABS: 38.92 10*3/uL (ref 34.80–93.90)
WBC: 5.8 10*3/uL (ref 4.0–10.3)

## 2017-07-20 LAB — LACTATE DEHYDROGENASE: LDH: 150 U/L (ref 125–245)

## 2017-07-20 MED ORDER — SODIUM CHLORIDE 0.9 % IV SOLN
375.0000 mg/m2 | Freq: Once | INTRAVENOUS | Status: AC
  Administered 2017-07-20: 700 mg via INTRAVENOUS
  Filled 2017-07-20: qty 50

## 2017-07-20 MED ORDER — SODIUM CHLORIDE 0.9 % IV SOLN
Freq: Once | INTRAVENOUS | Status: AC
  Administered 2017-07-20: 14:00:00 via INTRAVENOUS

## 2017-07-20 MED ORDER — SODIUM CHLORIDE 0.9% FLUSH
10.0000 mL | INTRAVENOUS | Status: DC | PRN
  Administered 2017-07-20: 10 mL
  Filled 2017-07-20: qty 10

## 2017-07-20 MED ORDER — SODIUM CHLORIDE 0.9 % IV SOLN
750.0000 mg/m2 | Freq: Once | INTRAVENOUS | Status: AC
  Administered 2017-07-20: 1460 mg via INTRAVENOUS
  Filled 2017-07-20: qty 73

## 2017-07-20 MED ORDER — ACETAMINOPHEN 325 MG PO TABS
650.0000 mg | ORAL_TABLET | Freq: Once | ORAL | Status: AC
  Administered 2017-07-20: 650 mg via ORAL

## 2017-07-20 MED ORDER — DEXAMETHASONE SODIUM PHOSPHATE 10 MG/ML IJ SOLN
10.0000 mg | Freq: Once | INTRAMUSCULAR | Status: AC
  Administered 2017-07-20: 10 mg via INTRAVENOUS

## 2017-07-20 MED ORDER — SODIUM CHLORIDE 0.9% FLUSH
10.0000 mL | INTRAVENOUS | Status: DC | PRN
  Administered 2017-07-20: 10 mL via INTRAVENOUS
  Filled 2017-07-20: qty 10

## 2017-07-20 MED ORDER — DIPHENHYDRAMINE HCL 25 MG PO CAPS
ORAL_CAPSULE | ORAL | Status: AC
  Filled 2017-07-20: qty 2

## 2017-07-20 MED ORDER — DIPHENHYDRAMINE HCL 25 MG PO CAPS
50.0000 mg | ORAL_CAPSULE | Freq: Once | ORAL | Status: AC
  Administered 2017-07-20: 50 mg via ORAL

## 2017-07-20 MED ORDER — PALONOSETRON HCL INJECTION 0.25 MG/5ML
0.2500 mg | Freq: Once | INTRAVENOUS | Status: AC
  Administered 2017-07-20: 0.25 mg via INTRAVENOUS

## 2017-07-20 MED ORDER — VINCRISTINE SULFATE CHEMO INJECTION 1 MG/ML
2.0000 mg | Freq: Once | INTRAVENOUS | Status: AC
  Administered 2017-07-20: 2 mg via INTRAVENOUS
  Filled 2017-07-20: qty 2

## 2017-07-20 MED ORDER — DEXAMETHASONE SODIUM PHOSPHATE 10 MG/ML IJ SOLN
INTRAMUSCULAR | Status: AC
  Filled 2017-07-20: qty 1

## 2017-07-20 MED ORDER — HEPARIN SOD (PORK) LOCK FLUSH 100 UNIT/ML IV SOLN
500.0000 [IU] | Freq: Once | INTRAVENOUS | Status: AC | PRN
  Administered 2017-07-20: 500 [IU]
  Filled 2017-07-20: qty 5

## 2017-07-20 MED ORDER — PALONOSETRON HCL INJECTION 0.25 MG/5ML
INTRAVENOUS | Status: AC
  Filled 2017-07-20: qty 5

## 2017-07-20 MED ORDER — ACETAMINOPHEN 325 MG PO TABS
ORAL_TABLET | ORAL | Status: AC
  Filled 2017-07-20: qty 2

## 2017-07-20 MED ORDER — DOXORUBICIN HCL CHEMO IV INJECTION 2 MG/ML
50.0000 mg/m2 | Freq: Once | INTRAVENOUS | Status: AC
  Administered 2017-07-20: 98 mg via INTRAVENOUS
  Filled 2017-07-20: qty 49
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
## 2017-07-20 NOTE — Patient Instructions (Signed)
[facility] Discharge Instructions for Patients Receiving Chemotherapy  Today you received the following chemotherapy agents Adriamycin, Vincristine, Cytoxan and Rituxan  To help prevent nausea and vomiting after your treatment, we encourage you to take your nausea medication as directed.    If you develop nausea and vomiting that is not controlled by your nausea medication, call the clinic.   BELOW ARE SYMPTOMS THAT SHOULD BE REPORTED IMMEDIATELY:  *FEVER GREATER THAN 100.5 F  *CHILLS WITH OR WITHOUT FEVER  NAUSEA AND VOMITING THAT IS NOT CONTROLLED WITH YOUR NAUSEA MEDICATION  *UNUSUAL SHORTNESS OF BREATH  *UNUSUAL BRUISING OR BLEEDING  TENDERNESS IN MOUTH AND THROAT WITH OR WITHOUT PRESENCE OF ULCERS  *URINARY PROBLEMS  *BOWEL PROBLEMS  UNUSUAL RASH Items with * indicate a potential emergency and should be followed up as soon as possible.  Feel free to call the clinic should you have any questions or concerns. The clinic phone number is [phone].  Please show the CHEMO ALERT CARD at check-in to the Emergency Department and triage nurse.   

## 2017-07-22 ENCOUNTER — Ambulatory Visit (HOSPITAL_BASED_OUTPATIENT_CLINIC_OR_DEPARTMENT_OTHER): Payer: Medicare HMO

## 2017-07-22 VITALS — BP 131/73 | HR 82 | Temp 98.3°F | Resp 18

## 2017-07-22 DIAGNOSIS — Z5189 Encounter for other specified aftercare: Secondary | ICD-10-CM | POA: Diagnosis not present

## 2017-07-22 DIAGNOSIS — C8333 Diffuse large B-cell lymphoma, intra-abdominal lymph nodes: Secondary | ICD-10-CM

## 2017-07-22 DIAGNOSIS — C8588 Other specified types of non-Hodgkin lymphoma, lymph nodes of multiple sites: Secondary | ICD-10-CM

## 2017-07-22 MED ORDER — PEGFILGRASTIM INJECTION 6 MG/0.6ML ~~LOC~~
6.0000 mg | PREFILLED_SYRINGE | Freq: Once | SUBCUTANEOUS | Status: AC
  Administered 2017-07-22: 6 mg via SUBCUTANEOUS

## 2017-07-22 MED ORDER — PEGFILGRASTIM INJECTION 6 MG/0.6ML ~~LOC~~
PREFILLED_SYRINGE | SUBCUTANEOUS | Status: AC
  Filled 2017-07-22: qty 0.6

## 2017-07-22 NOTE — Patient Instructions (Signed)
Pegfilgrastim injection What is this medicine? PEGFILGRASTIM (PEG fil gra stim) is a long-acting granulocyte colony-stimulating factor that stimulates the growth of neutrophils, a type of white blood cell important in the body's fight against infection. It is used to reduce the incidence of fever and infection in patients with certain types of cancer who are receiving chemotherapy that affects the bone marrow, and to increase survival after being exposed to high doses of radiation. This medicine may be used for other purposes; ask your health care provider or pharmacist if you have questions. COMMON BRAND NAME(S): Neulasta What should I tell my health care provider before I take this medicine? They need to know if you have any of these conditions: -kidney disease -latex allergy -ongoing radiation therapy -sickle cell disease -skin reactions to acrylic adhesives (On-Body Injector only) -an unusual or allergic reaction to pegfilgrastim, filgrastim, other medicines, foods, dyes, or preservatives -pregnant or trying to get pregnant -breast-feeding How should I use this medicine? This medicine is for injection under the skin. If you get this medicine at home, you will be taught how to prepare and give the pre-filled syringe or how to use the On-body Injector. Refer to the patient Instructions for Use for detailed instructions. Use exactly as directed. Tell your healthcare provider immediately if you suspect that the On-body Injector may not have performed as intended or if you suspect the use of the On-body Injector resulted in a missed or partial dose. It is important that you put your used needles and syringes in a special sharps container. Do not put them in a trash can. If you do not have a sharps container, call your pharmacist or healthcare provider to get one. Talk to your pediatrician regarding the use of this medicine in children. While this drug may be prescribed for selected conditions,  precautions do apply. Overdosage: If you think you have taken too much of this medicine contact a poison control center or emergency room at once. NOTE: This medicine is only for you. Do not share this medicine with others. What if I miss a dose? It is important not to miss your dose. Call your doctor or health care professional if you miss your dose. If you miss a dose due to an On-body Injector failure or leakage, a new dose should be administered as soon as possible using a single prefilled syringe for manual use. What may interact with this medicine? Interactions have not been studied. Give your health care provider a list of all the medicines, herbs, non-prescription drugs, or dietary supplements you use. Also tell them if you smoke, drink alcohol, or use illegal drugs. Some items may interact with your medicine. This list may not describe all possible interactions. Give your health care provider a list of all the medicines, herbs, non-prescription drugs, or dietary supplements you use. Also tell them if you smoke, drink alcohol, or use illegal drugs. Some items may interact with your medicine. What should I watch for while using this medicine? You may need blood work done while you are taking this medicine. If you are going to need a MRI, CT scan, or other procedure, tell your doctor that you are using this medicine (On-Body Injector only). What side effects may I notice from receiving this medicine? Side effects that you should report to your doctor or health care professional as soon as possible: -allergic reactions like skin rash, itching or hives, swelling of the face, lips, or tongue -dizziness -fever -pain, redness, or irritation at site   where injected -pinpoint red spots on the skin -red or dark-brown urine -shortness of breath or breathing problems -stomach or side pain, or pain at the shoulder -swelling -tiredness -trouble passing urine or change in the amount of urine Side  effects that usually do not require medical attention (report to your doctor or health care professional if they continue or are bothersome): -bone pain -muscle pain This list may not describe all possible side effects. Call your doctor for medical advice about side effects. You may report side effects to FDA at 1-800-FDA-1088. Where should I keep my medicine? Keep out of the reach of children. Store pre-filled syringes in a refrigerator between 2 and 8 degrees C (36 and 46 degrees F). Do not freeze. Keep in carton to protect from light. Throw away this medicine if it is left out of the refrigerator for more than 48 hours. Throw away any unused medicine after the expiration date. NOTE: This sheet is a summary. It may not cover all possible information. If you have questions about this medicine, talk to your doctor, pharmacist, or health care provider.  2018 Elsevier/Gold Standard (2016-07-07 12:58:03)  

## 2017-07-27 ENCOUNTER — Non-Acute Institutional Stay (SKILLED_NURSING_FACILITY): Payer: Medicare HMO | Admitting: Internal Medicine

## 2017-07-27 ENCOUNTER — Encounter: Payer: Self-pay | Admitting: Internal Medicine

## 2017-07-27 DIAGNOSIS — N1330 Unspecified hydronephrosis: Secondary | ICD-10-CM

## 2017-07-27 DIAGNOSIS — E8809 Other disorders of plasma-protein metabolism, not elsewhere classified: Secondary | ICD-10-CM | POA: Diagnosis not present

## 2017-07-27 DIAGNOSIS — L719 Rosacea, unspecified: Secondary | ICD-10-CM | POA: Diagnosis not present

## 2017-07-27 DIAGNOSIS — Z87820 Personal history of traumatic brain injury: Secondary | ICD-10-CM

## 2017-07-27 DIAGNOSIS — C8588 Other specified types of non-Hodgkin lymphoma, lymph nodes of multiple sites: Secondary | ICD-10-CM | POA: Diagnosis not present

## 2017-07-27 NOTE — Assessment & Plan Note (Signed)
Resolved as per urologic evaluation 04/28/17

## 2017-07-27 NOTE — Progress Notes (Signed)
NURSING HOME LOCATION:  [facility] ROOM NUMBER:  [id number]  CODE STATUS:  Full Code  PCP:  [place]  [address] [place] [address]  This is a nursing facility follow up for specific acute issue of chronic medical diagnoses  Interim medical record and care since last [place] visit was updated with review of diagnostic studies and change in clinical status since last visit were documented.  HPI: The patient is a permanent resident of the facility admitted 04/11/17. Medical diagnoses include high-grade large B-cell non-Hodgkin's lymphoma at least stage III A manifested as cervical lymphadenopathy and retroperitoneal lymphadenopathy complicated by hydronephrosis.  Dr. Irene Limbo has administered chemotherapy. Oncology last saw the patient 07/14/17 for his sixth cycle of R-CHOP. There was no evidence of toxicity from the chemotherapy or tumor lysis syndrome. His acute renal failure had resolved, creatinine was 0.7. Additionally the right-sided hydronephrosis ( see below) , abnormal liver function tests which were attributed to the lymphoma , and hyponatremia had also resolved.  The most recent labs were 07/20/17: sodium was minimally reduced at 135. Albumin 3.4 & total protein 6.1 were mildly reduced. Renal function remained normal.  On 04/28/17 Dr. Karsten Ro had  performed cystoscopy with bilateral retrograde pyelograms; no obstructive pathology or hydronephrosis was noted.  Review of systems: Dementia invalidated responses. He denied any active symptoms. He could not name his urologist or oncologist. He thought that he last saw his oncologist in February or March not last month. His answers are rambling and nonfocused. He will interrupt his answers with "Shut up ,Shanon Brow"  Constitutional: No fever,significant weight change, fatigue  Eyes: No redness, discharge, pain, vision change ENT/mouth: No nasal congestion,  purulent discharge, earache,change in hearing ,sore throat   Cardiovascular: No chest pain, palpitations,paroxysmal nocturnal dyspnea, claudication, edema  Respiratory: No cough, sputum production,hemoptysis, DOE , significant snoring,apnea  Gastrointestinal: No heartburn,dysphagia,abdominal pain, nausea / vomiting,rectal bleeding, melena,change in bowels Genitourinary: No dysuria,hematuria, pyuria,  incontinence, nocturia Musculoskeletal: No joint stiffness, joint swelling, weakness,pain Dermatologic: No rash, pruritus, change in appearance of skin Neurologic: No dizziness,headache,syncope, seizures, numbness , tingling Psychiatric: No significant anxiety , depression, insomnia, anorexia Endocrine: No change in hair/skin/ nails, excessive thirst, excessive hunger, excessive urination  Hematologic/lymphatic: No significant bruising, lymphadenopathy,abnormal bleeding Allergy/immunology: No itchy/ watery eyes, significant sneezing, urticaria, angioedema  Physical exam:  Pertinent or positive findings: Hair is thin and wispy. He is Geneticist, molecular. He has keratotic, exfoliative lesions over the face particularly the bridge of the nose and the malar area. This is associated with mild erythema suggesting rosacea. He has variable ptosis bilaterally. Slight gallop cadence is suggested. Breath sounds are decreased. His pedal pulses are surprisingly strong. Has a dramatic flexion contracture of the fifth right digit.  General appearance:Adequately nourished; no acute distress , increased work of breathing is present.   Lymphatic: No lymphadenopathy about the head, neck, axilla despite PMH of lymphoma . Eyes: No conjunctival inflammation or lid edema is present. There is no scleral icterus. Ears:  External ear exam shows no significant lesions or deformities.   Nose:  External nasal examination shows no deformity or inflammation. Nasal mucosa are pink and moist without lesions ,exudates Oral exam: lips and gums are healthy appearing.There is no oropharyngeal erythema or  exudate . Neck:  No thyromegaly, masses, tenderness noted.    Heart:  Normal rate and regular rhythm. S1 and S2 normal without  murmur, click, rub  Lungs: without wheezes, rhonchi,rales , rubs. Abdomen:Bowel sounds are normal. Abdomen is  soft and nontender with no organomegaly, hernias,masses. GU: deferred  Extremities:  No cyanosis, clubbing,edema  Neurologic exam : Cn 2-7 intact Strength equal  in upper & lower extremities Balance,Rhomberg,finger to nose testing could not be completed due to clinical state Deep tendon reflexes are equal Skin: Warm & dry w/o tenting.  See summary under each active problem in the Problem List with associated updated therapeutic plan

## 2017-07-27 NOTE — Assessment & Plan Note (Signed)
Follow-up as per Dr. Irene Limbo Will not draw labs here unless there is an acute change in condition such as fever or signs or symptoms suggesting some bleeding dyscrasia

## 2017-07-27 NOTE — Assessment & Plan Note (Signed)
If these progress, nutritional supplementation will be pursued

## 2017-07-27 NOTE — Assessment & Plan Note (Signed)
Patient is unable to give any meaningful history Answers are rambling and nonfocused  Each time he demonstrates this pattern, he tells himself to "shut up". At this time he is not exhibiting any worrisome behavior warranting psychotropic medication.

## 2017-07-27 NOTE — Assessment & Plan Note (Signed)
Encourage topical metronidazole if rosacea progresses

## 2017-07-27 NOTE — Patient Instructions (Addendum)
See assessment and plan under each diagnosis in the problem list and acutely for this visit Total time 27 minutes; greater than 50% of the visit spent reviewing sub specialty records & interpolating those data with SNF records.

## 2017-08-09 ENCOUNTER — Encounter (HOSPITAL_COMMUNITY): Payer: Medicare HMO

## 2017-08-09 ENCOUNTER — Telehealth: Payer: Self-pay

## 2017-08-09 NOTE — Telephone Encounter (Signed)
Called [facility] at [phone] and was transferred to [facility] who assisted me with the pt schedule. Appointments for tomorrow cancelled as PET scan scheduled for 1/23. Pt f/u and labs with Dr. Irene Limbo rescheduled for 1/29 at [facility] performed readback and confirmed date and time of PET as well.

## 2017-08-10 ENCOUNTER — Other Ambulatory Visit: Payer: Medicare HMO

## 2017-08-10 ENCOUNTER — Ambulatory Visit: Payer: Medicare HMO | Admitting: Hematology

## 2017-08-16 ENCOUNTER — Encounter (HOSPITAL_COMMUNITY)
Admission: RE | Admit: 2017-08-16 | Discharge: 2017-08-16 | Disposition: A | Discharge status: Home / Self Care | Payer: Medicare HMO | Source: Ambulatory Visit | Attending: Hematology | Admitting: Hematology

## 2017-08-16 DIAGNOSIS — C8588 Other specified types of non-Hodgkin lymphoma, lymph nodes of multiple sites: Secondary | ICD-10-CM | POA: Insufficient documentation

## 2017-08-16 DIAGNOSIS — C8590 Non-Hodgkin lymphoma, unspecified, unspecified site: Secondary | ICD-10-CM | POA: Diagnosis not present

## 2017-08-16 LAB — GLUCOSE, CAPILLARY: Glucose-Capillary: 90 mg/dL (ref 65–99)

## 2017-08-16 MED ORDER — FLUDEOXYGLUCOSE F - 18 (FDG) INJECTION
7.9400 | Freq: Once | INTRAVENOUS | Status: AC | PRN
  Administered 2017-08-16: 7.94 via INTRAVENOUS
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
## 2017-08-18 ENCOUNTER — Non-Acute Institutional Stay (SKILLED_NURSING_FACILITY): Payer: Medicare HMO | Admitting: Adult Health

## 2017-08-18 ENCOUNTER — Encounter: Payer: Self-pay | Admitting: Adult Health

## 2017-08-18 DIAGNOSIS — L219 Seborrheic dermatitis, unspecified: Secondary | ICD-10-CM
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
## 2017-08-22 ENCOUNTER — Inpatient Hospital Stay: Payer: Medicare HMO | Attending: Hematology | Admitting: Hematology

## 2017-08-22 ENCOUNTER — Inpatient Hospital Stay: Payer: Medicare HMO

## 2017-08-22 ENCOUNTER — Other Ambulatory Visit: Payer: Self-pay

## 2017-08-22 ENCOUNTER — Encounter: Payer: Self-pay | Admitting: Hematology

## 2017-08-22 VITALS — BP 119/69 | HR 85 | Temp 97.8°F | Resp 20 | Ht 72.0 in | Wt 166.0 lb

## 2017-08-22 DIAGNOSIS — Z9221 Personal history of antineoplastic chemotherapy: Secondary | ICD-10-CM | POA: Diagnosis not present

## 2017-08-22 DIAGNOSIS — N179 Acute kidney failure, unspecified: Secondary | ICD-10-CM | POA: Insufficient documentation

## 2017-08-22 DIAGNOSIS — Z79899 Other long term (current) drug therapy: Secondary | ICD-10-CM | POA: Insufficient documentation

## 2017-08-22 DIAGNOSIS — C8510 Unspecified B-cell lymphoma, unspecified site: Secondary | ICD-10-CM | POA: Diagnosis not present

## 2017-08-22 DIAGNOSIS — C8588 Other specified types of non-Hodgkin lymphoma, lymph nodes of multiple sites: Secondary | ICD-10-CM

## 2017-08-22 LAB — CBC WITH DIFFERENTIAL (CANCER CENTER ONLY)
BASOS ABS: 0 10*3/uL (ref 0.0–0.1)
BASOS PCT: 0 %
EOS PCT: 20 %
Eosinophils Absolute: 1.9 10*3/uL — ABNORMAL HIGH (ref 0.0–0.5)
HCT: 35.4 % — ABNORMAL LOW (ref 38.4–49.9)
Hemoglobin: 11.7 g/dL — ABNORMAL LOW (ref 13.0–17.1)
Lymphocytes Relative: 14 %
Lymphs Abs: 1.4 10*3/uL (ref 0.9–3.3)
MCH: 35.7 pg — ABNORMAL HIGH (ref 27.2–33.4)
MCHC: 33.1 g/dL (ref 32.0–36.0)
MCV: 107.9 fL — ABNORMAL HIGH (ref 79.3–98.0)
MONO ABS: 1.4 10*3/uL — AB (ref 0.1–0.9)
Monocytes Relative: 15 %
Neutro Abs: 5.1 10*3/uL (ref 1.5–6.5)
Neutrophils Relative %: 51 %
PLATELETS: 199 10*3/uL (ref 140–400)
RBC: 3.28 MIL/uL — ABNORMAL LOW (ref 4.20–5.82)
RDW: 14.5 % (ref 11.0–15.6)
WBC Count: 9.9 10*3/uL (ref 4.0–10.3)

## 2017-08-22 LAB — LACTATE DEHYDROGENASE: LDH: 158 U/L (ref 125–245)

## 2017-08-22 LAB — CMP (CANCER CENTER ONLY)
ALBUMIN: 3.6 g/dL (ref 3.5–5.0)
ALK PHOS: 83 U/L (ref 40–150)
ALT: 19 U/L (ref 0–55)
AST: 20 U/L (ref 5–34)
Anion gap: 8 (ref 3–11)
BILIRUBIN TOTAL: 0.2 mg/dL (ref 0.2–1.2)
BUN: 16 mg/dL (ref 7–26)
CO2: 29 mmol/L (ref 22–29)
CREATININE: 1.09 mg/dL (ref 0.70–1.30)
Calcium: 9.2 mg/dL (ref 8.4–10.4)
Chloride: 102 mmol/L (ref 98–109)
GFR, Est AFR Am: >60 mL/min (ref >60)
GFR, Estimated: >60 mL/min (ref >60)
GLUCOSE: 102 mg/dL (ref 70–140)
Potassium: 4.6 mmol/L (ref 3.5–5.1)
Sodium: 139 mmol/L (ref 136–145)
TOTAL PROTEIN: 6.3 g/dL — AB (ref 6.4–8.3)

## 2017-08-23 ENCOUNTER — Telehealth: Payer: Self-pay

## 2017-08-23 NOTE — Telephone Encounter (Signed)
No los per 1/29 los

## 2017-08-31 ENCOUNTER — Non-Acute Institutional Stay (SKILLED_NURSING_FACILITY): Payer: Medicare HMO | Admitting: Adult Health

## 2017-08-31 DIAGNOSIS — M1A9XX0 Chronic gout, unspecified, without tophus (tophi): Secondary | ICD-10-CM | POA: Diagnosis not present

## 2017-08-31 DIAGNOSIS — C8588 Other specified types of non-Hodgkin lymphoma, lymph nodes of multiple sites: Secondary | ICD-10-CM

## 2017-08-31 DIAGNOSIS — Z87820 Personal history of traumatic brain injury: Secondary | ICD-10-CM
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
## 2017-09-01 ENCOUNTER — Encounter: Payer: Self-pay | Admitting: Adult Health

## 2017-09-11 ENCOUNTER — Other Ambulatory Visit: Payer: Self-pay | Admitting: Radiology

## 2017-09-12 ENCOUNTER — Encounter (HOSPITAL_COMMUNITY): Payer: Self-pay

## 2017-09-12 ENCOUNTER — Ambulatory Visit (HOSPITAL_COMMUNITY)
Admission: RE | Admit: 2017-09-12 | Discharge: 2017-09-12 | Disposition: A | Discharge status: Home / Self Care | Payer: Medicare HMO | Source: Ambulatory Visit | Attending: Hematology | Admitting: Hematology

## 2017-09-12 DIAGNOSIS — Z8572 Personal history of non-Hodgkin lymphomas: Secondary | ICD-10-CM | POA: Insufficient documentation

## 2017-09-12 DIAGNOSIS — Z5111 Encounter for antineoplastic chemotherapy: Secondary | ICD-10-CM | POA: Diagnosis not present

## 2017-09-12 DIAGNOSIS — Z452 Encounter for adjustment and management of vascular access device: Secondary | ICD-10-CM | POA: Diagnosis not present

## 2017-09-12 DIAGNOSIS — Z9221 Personal history of antineoplastic chemotherapy: Secondary | ICD-10-CM | POA: Diagnosis not present

## 2017-09-12 DIAGNOSIS — C8590 Non-Hodgkin lymphoma, unspecified, unspecified site: Secondary | ICD-10-CM | POA: Diagnosis not present

## 2017-09-12 DIAGNOSIS — C8588 Other specified types of non-Hodgkin lymphoma, lymph nodes of multiple sites: Secondary | ICD-10-CM

## 2017-09-12 HISTORY — PX: IR REMOVAL TUN ACCESS W/ PORT W/O FL MOD SED: IMG2290

## 2017-09-12 LAB — CBC WITH DIFFERENTIAL/PLATELET
Basophils Absolute: 0 10*3/uL (ref 0.0–0.1)
Basophils Relative: 0 %
EOS ABS: 0.2 10*3/uL (ref 0.0–0.7)
Eosinophils Relative: 3 %
HEMATOCRIT: 38.7 % — AB (ref 39.0–52.0)
HEMOGLOBIN: 13.1 g/dL (ref 13.0–17.0)
LYMPHS ABS: 0.9 10*3/uL (ref 0.7–4.0)
LYMPHS PCT: 13 %
MCH: 35.8 pg — AB (ref 26.0–34.0)
MCHC: 33.9 g/dL (ref 30.0–36.0)
MCV: 105.7 fL — AB (ref 78.0–100.0)
MONOS PCT: 13 %
Monocytes Absolute: 0.8 10*3/uL (ref 0.1–1.0)
NEUTROS ABS: 4.8 10*3/uL (ref 1.7–7.7)
NEUTROS PCT: 71 %
Platelets: 194 10*3/uL (ref 150–400)
RBC: 3.66 MIL/uL — ABNORMAL LOW (ref 4.22–5.81)
RDW: 13.5 % (ref 11.5–15.5)
WBC: 6.7 10*3/uL (ref 4.0–10.5)

## 2017-09-12 LAB — PROTIME-INR
INR: 0.95
Prothrombin Time: 12.6 seconds (ref 11.4–15.2)

## 2017-09-12 MED ORDER — MIDAZOLAM HCL 2 MG/2ML IJ SOLN
INTRAMUSCULAR | Status: AC | PRN
  Administered 2017-09-12 (×2): 1 mg via INTRAVENOUS

## 2017-09-12 MED ORDER — MIDAZOLAM HCL 2 MG/2ML IJ SOLN
INTRAMUSCULAR | Status: AC
  Filled 2017-09-12: qty 2

## 2017-09-12 MED ORDER — FENTANYL CITRATE (PF) 100 MCG/2ML IJ SOLN
INTRAMUSCULAR | Status: AC
  Filled 2017-09-12: qty 2

## 2017-09-12 MED ORDER — LIDOCAINE-EPINEPHRINE (PF) 2 %-1:200000 IJ SOLN
INTRAMUSCULAR | Status: AC
  Filled 2017-09-12: qty 20

## 2017-09-12 MED ORDER — LIDOCAINE-EPINEPHRINE (PF) 1 %-1:200000 IJ SOLN
INTRAMUSCULAR | Status: AC | PRN
  Administered 2017-09-12: 20 mL

## 2017-09-12 MED ORDER — FENTANYL CITRATE (PF) 100 MCG/2ML IJ SOLN
INTRAMUSCULAR | Status: AC | PRN
  Administered 2017-09-12 (×2): 50 ug via INTRAVENOUS

## 2017-09-12 MED ORDER — CEFAZOLIN SODIUM-DEXTROSE 2-4 GM/100ML-% IV SOLN
2.0000 g | INTRAVENOUS | Status: AC
  Administered 2017-09-12: 2 g via INTRAVENOUS
  Filled 2017-09-12: qty 100

## 2017-09-12 MED ORDER — SODIUM CHLORIDE 0.9 % IV SOLN
INTRAVENOUS | Status: DC
  Administered 2017-09-12: 08:00:00 via INTRAVENOUS

## 2017-09-12 NOTE — Discharge Instructions (Signed)
You may remove dressing and bathe in 24 hours.  ° °Implanted Port Removal, Care After °Refer to this sheet in the next few weeks. These instructions provide you with information about caring for yourself after your procedure. Your health care provider may also give you more specific instructions. Your treatment has been planned according to current medical practices, but problems sometimes occur. Call your health care provider if you have any problems or questions after your procedure. °What can I expect after the procedure? °After the procedure, it is common to have: °· Soreness or pain near your incision. °· Some swelling or bruising near your incision. ° °Follow these instructions at home: °Medicines °· Take over-the-counter and prescription medicines only as told by your health care provider. °· If you were prescribed an antibiotic medicine, take it as told by your health care provider. Do not stop taking the antibiotic even if you start to feel better. °Bathing °· Do not take baths, swim, or use a hot tub until your health care provider approves. Ask your health care provider if you can take showers. You may only be allowed to take sponge baths for bathing. °Incision care °· Follow instructions from your health care provider about how to take care of your incision. Make sure you: °? Wash your hands with soap and water before you change your bandage (dressing). If soap and water are not available, use hand sanitizer. °? Change your dressing as told by your health care provider. °? Keep your dressing dry. °? Leave stitches (sutures), skin glue, or adhesive strips in place. These skin closures may need to stay in place for 2 weeks or longer. If adhesive strip edges start to loosen and curl up, you may trim the loose edges. Do not remove adhesive strips completely unless your health care provider tells you to do that. °· Check your incision area every day for signs of infection. Check for: °? More redness,  swelling, or pain. °? More fluid or blood. °? Warmth. °? Pus or a bad smell. °Driving °· If you received a sedative, do not drive for 24 hours after the procedure. °· If you did not receive a sedative, ask your health care provider when it is safe to drive. °Activity °· Return to your normal activities as told by your health care provider. Ask your health care provider what activities are safe for you. °· Until your health care provider says it is safe: °? Do not lift anything that is heavier than 10 lb (4.5 kg). °? Do not do activities that involve lifting your arms over your head. °General instructions °· Do not use any tobacco products, such as cigarettes, chewing tobacco, and e-cigarettes. Tobacco can delay healing. If you need help quitting, ask your health care provider. °· Keep all follow-up visits as told by your health care provider. This is important. °Contact a health care provider if: °· You have more redness, swelling, or pain around your incision. °· You have more fluid or blood coming from your incision. °· Your incision feels warm to the touch. °· You have pus or a bad smell coming from your incision. °· You have a fever. °· You have pain that is not relieved by your pain medicine. °Get help right away if: °· You have chest pain. °· You have difficulty breathing. °This information is not intended to replace advice given to you by your health care provider. Make sure you discuss any questions you have with your health care provider. °Document   Released: 06/22/2015 Document Revised: 12/17/2015 Document Reviewed: 04/15/2015 °Elsevier Interactive Patient Education © 2018 [employer] ° ° °Moderate Conscious Sedation, Adult, Care After °These instructions provide you with information about caring for yourself after your procedure. Your health care provider may also give you more specific instructions. Your treatment has been planned according to current medical practices, but problems sometimes occur.  Call your health care provider if you have any problems or questions after your procedure. °What can I expect after the procedure? °After your procedure, it is common: °· To feel sleepy for several hours. °· To feel clumsy and have poor balance for several hours. °· To have poor judgment for several hours. °· To vomit if you eat too soon. ° °Follow these instructions at home: °For at least 24 hours after the procedure: ° °· Do not: °? Participate in activities where you could fall or become injured. °? Drive. °? Use heavy machinery. °? Drink alcohol. °? Take sleeping pills or medicines that cause drowsiness. °? Make important decisions or sign legal documents. °? Take care of children on your own. °· Rest. °Eating and drinking °· Follow the diet recommended by your health care provider. °· If you vomit: °? Drink water, juice, or soup when you can drink without vomiting. °? Make sure you have little or no nausea before eating solid foods. °General instructions °· Have a responsible adult stay with you until you are awake and alert. °· Take over-the-counter and prescription medicines only as told by your health care provider. °· If you smoke, do not smoke without supervision. °· Keep all follow-up visits as told by your health care provider. This is important. °Contact a health care provider if: °· You keep feeling nauseous or you keep vomiting. °· You feel light-headed. °· You develop a rash. °· You have a fever. °Get help right away if: °· You have trouble breathing. °This information is not intended to replace advice given to you by your health care provider. Make sure you discuss any questions you have with your health care provider. °Document Released: 05/01/2013 Document Revised: 12/14/2015 Document Reviewed: 10/31/2015 °Elsevier Interactive Patient Education © 2018 [employer]vier Inc. ° °

## 2017-09-12 NOTE — Procedures (Signed)
Lymphoma  S/p RT IJ POWER PORT REMOVAL  NO COMP STABLE EBL 0 FULL REPORT IN PACS
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
## 2017-09-28 ENCOUNTER — Non-Acute Institutional Stay (SKILLED_NURSING_FACILITY): Payer: Medicare HMO | Admitting: Adult Health

## 2017-09-28 ENCOUNTER — Encounter: Payer: Self-pay | Admitting: Adult Health

## 2017-09-28 DIAGNOSIS — Z87820 Personal history of traumatic brain injury: Secondary | ICD-10-CM

## 2017-09-28 DIAGNOSIS — C8588 Other specified types of non-Hodgkin lymphoma, lymph nodes of multiple sites: Secondary | ICD-10-CM

## 2017-09-28 DIAGNOSIS — M1A9XX0 Chronic gout, unspecified, without tophus (tophi): Secondary | ICD-10-CM
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
## 2017-09-29 DIAGNOSIS — R262 Difficulty in walking, not elsewhere classified: Secondary | ICD-10-CM | POA: Diagnosis not present

## 2017-09-29 DIAGNOSIS — B351 Tinea unguium: Secondary | ICD-10-CM | POA: Diagnosis not present

## 2017-09-29 DIAGNOSIS — I739 Peripheral vascular disease, unspecified: Secondary | ICD-10-CM | POA: Diagnosis not present

## 2017-09-29 LAB — HM DIABETES FOOT EXAM

## 2017-10-18 ENCOUNTER — Non-Acute Institutional Stay (SKILLED_NURSING_FACILITY): Payer: Medicare HMO | Admitting: Adult Health

## 2017-10-18 ENCOUNTER — Encounter: Payer: Self-pay | Admitting: Adult Health

## 2017-10-18 DIAGNOSIS — M1A9XX0 Chronic gout, unspecified, without tophus (tophi): Secondary | ICD-10-CM | POA: Diagnosis not present

## 2017-10-18 DIAGNOSIS — Z8572 Personal history of non-Hodgkin lymphomas: Secondary | ICD-10-CM | POA: Diagnosis not present

## 2017-10-18 DIAGNOSIS — Z87820 Personal history of traumatic brain injury: Secondary | ICD-10-CM
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
## 2017-11-16 ENCOUNTER — Encounter: Payer: Self-pay | Admitting: Internal Medicine

## 2017-11-16 ENCOUNTER — Non-Acute Institutional Stay (SKILLED_NURSING_FACILITY): Payer: Medicare HMO | Admitting: Internal Medicine

## 2017-11-16 DIAGNOSIS — F482 Pseudobulbar affect: Secondary | ICD-10-CM | POA: Insufficient documentation

## 2017-11-16 DIAGNOSIS — D539 Nutritional anemia, unspecified: Secondary | ICD-10-CM | POA: Diagnosis not present

## 2017-11-16 DIAGNOSIS — C8588 Other specified types of non-Hodgkin lymphoma, lymph nodes of multiple sites: Secondary | ICD-10-CM
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
## 2017-11-16 NOTE — Assessment & Plan Note (Signed)
Check B12 and folate levels ?

## 2017-11-16 NOTE — Assessment & Plan Note (Signed)
Psych NP followup ? Nudexta trial

## 2017-11-17 ENCOUNTER — Encounter: Payer: Self-pay | Admitting: Internal Medicine

## 2017-11-17 DIAGNOSIS — R799 Abnormal finding of blood chemistry, unspecified: Secondary | ICD-10-CM | POA: Diagnosis not present

## 2017-11-17 NOTE — Patient Instructions (Signed)
See assessment and plan under each diagnosis in the problem list and acutely for this visit 

## 2017-11-17 NOTE — Assessment & Plan Note (Signed)
As per Dr Irene Limbo, Oncology
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
## 2017-11-27 ENCOUNTER — Inpatient Hospital Stay: Payer: Medicare HMO

## 2017-11-27 ENCOUNTER — Encounter: Payer: Self-pay | Admitting: Hematology

## 2017-11-27 ENCOUNTER — Inpatient Hospital Stay: Payer: Medicare HMO | Attending: Hematology | Admitting: Hematology

## 2017-11-27 VITALS — BP 120/83 | HR 96 | Temp 98.4°F | Resp 20 | Ht 72.0 in | Wt 179.5 lb

## 2017-11-27 DIAGNOSIS — C8588 Other specified types of non-Hodgkin lymphoma, lymph nodes of multiple sites: Secondary | ICD-10-CM

## 2017-11-27 DIAGNOSIS — C8330 Diffuse large B-cell lymphoma, unspecified site: Secondary | ICD-10-CM | POA: Insufficient documentation

## 2017-11-27 DIAGNOSIS — Z95828 Presence of other vascular implants and grafts: Secondary | ICD-10-CM

## 2017-11-27 DIAGNOSIS — Z9221 Personal history of antineoplastic chemotherapy: Secondary | ICD-10-CM

## 2017-11-27 LAB — CBC WITH DIFFERENTIAL (CANCER CENTER ONLY)
BASOS PCT: 0 %
Basophils Absolute: 0 10*3/uL (ref 0.0–0.1)
Eosinophils Absolute: 0.1 10*3/uL (ref 0.0–0.5)
Eosinophils Relative: 2 %
HCT: 42.7 % (ref 38.4–49.9)
HEMOGLOBIN: 14.6 g/dL (ref 13.0–17.1)
LYMPHS ABS: 1.1 10*3/uL (ref 0.9–3.3)
LYMPHS PCT: 14 %
MCH: 34.6 pg — AB (ref 27.2–33.4)
MCHC: 34.2 g/dL (ref 32.0–36.0)
MCV: 101.2 fL — AB (ref 79.3–98.0)
MONO ABS: 1 10*3/uL — AB (ref 0.1–0.9)
Monocytes Relative: 14 %
NEUTROS PCT: 70 %
Neutro Abs: 5.1 10*3/uL (ref 1.5–6.5)
Platelet Count: 206 10*3/uL (ref 140–400)
RBC: 4.22 MIL/uL (ref 4.20–5.82)
RDW: 13.6 % (ref 11.0–14.6)
WBC Count: 7.3 10*3/uL (ref 4.0–10.3)

## 2017-11-27 LAB — RETICULOCYTES
RBC.: 4.22 MIL/uL (ref 4.20–5.82)
Retic Count, Absolute: 54.9 10*3/uL (ref 34.8–93.9)
Retic Ct Pct: 1.3 % (ref 0.8–1.8)

## 2017-11-27 LAB — CMP (CANCER CENTER ONLY)
ALBUMIN: 3.9 g/dL (ref 3.5–5.0)
ALK PHOS: 103 U/L (ref 40–150)
ALT: 18 U/L (ref 0–55)
ANION GAP: 4 (ref 3–11)
AST: 20 U/L (ref 5–34)
BUN: 15 mg/dL (ref 7–26)
CALCIUM: 9.6 mg/dL (ref 8.4–10.4)
CHLORIDE: 103 mmol/L (ref 98–109)
CO2: 29 mmol/L (ref 22–29)
Creatinine: 0.92 mg/dL (ref 0.70–1.30)
GFR, Estimated: >60 mL/min (ref >60)
GLUCOSE: 91 mg/dL (ref 70–140)
POTASSIUM: 4.7 mmol/L (ref 3.5–5.1)
SODIUM: 136 mmol/L (ref 136–145)
Total Bilirubin: 0.5 mg/dL (ref 0.2–1.2)
Total Protein: 6.8 g/dL (ref 6.4–8.3)

## 2017-11-27 LAB — LACTATE DEHYDROGENASE: LDH: 149 U/L (ref 125–245)

## 2017-11-27 NOTE — Patient Instructions (Signed)
Thank you for choosing [facility] to provide your oncology and hematology care.  To afford each patient quality time with our providers, please arrive 30 minutes before your scheduled appointment time.  If you arrive late for your appointment, you may be asked to reschedule.  We strive to give you quality time with our providers, and arriving late affects you and other patients whose appointments are after yours.   If you are a no show for multiple scheduled visits, you may be dismissed from the clinic at the providers discretion.    Again, thank you for choosing [facility], our hope is that these requests will decrease the amount of time that you wait before being seen by our physicians.  ______________________________________________________________________  Should you have questions after your visit to the [facility], please contact our office at [phone] between the hours of 8:30 and 4:30 p.m.    Voicemails left after 4:30p.m will not be returned until the following business day.    For prescription refill requests, please have your pharmacy contact us directly.  Please also try to allow 48 hours for prescription requests.    Please contact the scheduling department for questions regarding scheduling.  For scheduling of procedures such as PET scans, CT scans, MRI, Ultrasound, etc please contact central scheduling at [phone].    Resources For Cancer Patients and Caregivers:   Oncolink.org:  A wonderful resource for patients and healthcare providers for information regarding your disease, ways to tract your treatment, what to expect, etc.     American Cancer Society:  800-227-2345  Can help patients locate various types of support and financial assistance  Cancer Care: 1-800-813-HOPE (4673) Provides financial assistance, online support groups, medication/co-pay assistance.    [place] DSS:  336-641-3447 Where to apply for food  stamps, Medicaid, and utility assistance  Medicare Rights Center: 800-333-4114 Helps people with Medicare understand their rights and benefits, navigate the Medicare system, and secure the quality healthcare they deserve  SCAT: 336-333-6589 [place] [employer]'s shared-ride transportation service for eligible riders who have a disability that prevents them from riding the fixed route bus.    For additional information on assistance programs please contact our social worker:   Grier Hock/Abigail Elmore:  [phone]            

## 2017-12-08 ENCOUNTER — Encounter: Payer: Self-pay | Admitting: Adult Health

## 2017-12-08 ENCOUNTER — Non-Acute Institutional Stay (SKILLED_NURSING_FACILITY): Payer: Medicare HMO | Admitting: Adult Health

## 2017-12-08 DIAGNOSIS — M1A9XX0 Chronic gout, unspecified, without tophus (tophi): Secondary | ICD-10-CM | POA: Diagnosis not present

## 2017-12-08 DIAGNOSIS — L853 Xerosis cutis: Secondary | ICD-10-CM

## 2017-12-08 DIAGNOSIS — Z8579 Personal history of other malignant neoplasms of lymphoid, hematopoietic and related tissues: Secondary | ICD-10-CM | POA: Diagnosis not present

## 2017-12-08 DIAGNOSIS — Z87820 Personal history of traumatic brain injury: Secondary | ICD-10-CM | POA: Diagnosis not present
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
## 2017-12-29 DIAGNOSIS — B351 Tinea unguium: Secondary | ICD-10-CM | POA: Diagnosis not present

## 2017-12-29 DIAGNOSIS — I739 Peripheral vascular disease, unspecified: Secondary | ICD-10-CM | POA: Diagnosis not present

## 2017-12-29 DIAGNOSIS — R262 Difficulty in walking, not elsewhere classified: Secondary | ICD-10-CM | POA: Diagnosis not present

## 2018-01-01 ENCOUNTER — Encounter: Payer: Self-pay | Admitting: Adult Health

## 2018-01-01 ENCOUNTER — Non-Acute Institutional Stay (SKILLED_NURSING_FACILITY): Payer: Medicare HMO | Admitting: Adult Health

## 2018-01-01 DIAGNOSIS — M109 Gout, unspecified: Secondary | ICD-10-CM

## 2018-01-01 DIAGNOSIS — L219 Seborrheic dermatitis, unspecified: Secondary | ICD-10-CM

## 2018-01-01 DIAGNOSIS — Z87820 Personal history of traumatic brain injury: Secondary | ICD-10-CM | POA: Diagnosis not present
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
## 2018-01-03 ENCOUNTER — Encounter: Payer: Self-pay | Admitting: Adult Health

## 2018-01-03 ENCOUNTER — Non-Acute Institutional Stay (SKILLED_NURSING_FACILITY): Payer: Medicare HMO | Admitting: Adult Health

## 2018-01-03 DIAGNOSIS — Z9189 Other specified personal risk factors, not elsewhere classified: Secondary | ICD-10-CM
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
## 2018-01-04 DIAGNOSIS — C8518 Unspecified B-cell lymphoma, lymph nodes of multiple sites: Secondary | ICD-10-CM | POA: Diagnosis not present

## 2018-01-04 DIAGNOSIS — R488 Other symbolic dysfunctions: Secondary | ICD-10-CM | POA: Diagnosis not present

## 2018-01-05 DIAGNOSIS — R488 Other symbolic dysfunctions: Secondary | ICD-10-CM | POA: Diagnosis not present

## 2018-01-05 DIAGNOSIS — C8518 Unspecified B-cell lymphoma, lymph nodes of multiple sites: Secondary | ICD-10-CM | POA: Diagnosis not present

## 2018-01-08 DIAGNOSIS — R488 Other symbolic dysfunctions: Secondary | ICD-10-CM | POA: Diagnosis not present

## 2018-01-08 DIAGNOSIS — C8518 Unspecified B-cell lymphoma, lymph nodes of multiple sites: Secondary | ICD-10-CM | POA: Diagnosis not present

## 2018-01-09 DIAGNOSIS — C8518 Unspecified B-cell lymphoma, lymph nodes of multiple sites: Secondary | ICD-10-CM | POA: Diagnosis not present

## 2018-01-09 DIAGNOSIS — R488 Other symbolic dysfunctions: Secondary | ICD-10-CM | POA: Diagnosis not present

## 2018-01-10 DIAGNOSIS — C8518 Unspecified B-cell lymphoma, lymph nodes of multiple sites: Secondary | ICD-10-CM | POA: Diagnosis not present

## 2018-01-10 DIAGNOSIS — R488 Other symbolic dysfunctions: Secondary | ICD-10-CM | POA: Diagnosis not present

## 2018-01-11 DIAGNOSIS — R488 Other symbolic dysfunctions: Secondary | ICD-10-CM | POA: Diagnosis not present

## 2018-01-11 DIAGNOSIS — C8518 Unspecified B-cell lymphoma, lymph nodes of multiple sites: Secondary | ICD-10-CM | POA: Diagnosis not present

## 2018-01-12 DIAGNOSIS — C8518 Unspecified B-cell lymphoma, lymph nodes of multiple sites: Secondary | ICD-10-CM | POA: Diagnosis not present

## 2018-01-12 DIAGNOSIS — R488 Other symbolic dysfunctions: Secondary | ICD-10-CM | POA: Diagnosis not present

## 2018-01-15 DIAGNOSIS — C8518 Unspecified B-cell lymphoma, lymph nodes of multiple sites: Secondary | ICD-10-CM | POA: Diagnosis not present

## 2018-01-15 DIAGNOSIS — R488 Other symbolic dysfunctions: Secondary | ICD-10-CM | POA: Diagnosis not present

## 2018-01-16 DIAGNOSIS — Z593 Problems related to living in residential institution: Secondary | ICD-10-CM | POA: Diagnosis not present

## 2018-01-16 DIAGNOSIS — R488 Other symbolic dysfunctions: Secondary | ICD-10-CM | POA: Diagnosis not present

## 2018-01-16 DIAGNOSIS — C8518 Unspecified B-cell lymphoma, lymph nodes of multiple sites: Secondary | ICD-10-CM | POA: Diagnosis not present

## 2018-01-16 DIAGNOSIS — Z1331 Encounter for screening for depression: Secondary | ICD-10-CM | POA: Diagnosis not present

## 2018-01-17 DIAGNOSIS — C8518 Unspecified B-cell lymphoma, lymph nodes of multiple sites: Secondary | ICD-10-CM | POA: Diagnosis not present

## 2018-01-17 DIAGNOSIS — R488 Other symbolic dysfunctions: Secondary | ICD-10-CM | POA: Diagnosis not present

## 2018-02-08 ENCOUNTER — Non-Acute Institutional Stay (SKILLED_NURSING_FACILITY): Payer: Medicare HMO | Admitting: Adult Health

## 2018-02-08 ENCOUNTER — Encounter: Payer: Self-pay | Admitting: Adult Health

## 2018-02-08 DIAGNOSIS — L853 Xerosis cutis: Secondary | ICD-10-CM

## 2018-02-08 DIAGNOSIS — Z87820 Personal history of traumatic brain injury: Secondary | ICD-10-CM | POA: Diagnosis not present

## 2018-02-08 DIAGNOSIS — M1A9XX0 Chronic gout, unspecified, without tophus (tophi): Secondary | ICD-10-CM
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
## 2018-02-14 DIAGNOSIS — Z593 Problems related to living in residential institution: Secondary | ICD-10-CM | POA: Diagnosis not present

## 2018-02-14 DIAGNOSIS — Z1331 Encounter for screening for depression: Secondary | ICD-10-CM | POA: Diagnosis not present

## 2018-02-26 ENCOUNTER — Inpatient Hospital Stay: Payer: Medicare HMO

## 2018-02-26 ENCOUNTER — Inpatient Hospital Stay: Payer: Medicare HMO | Attending: Hematology | Admitting: Hematology

## 2018-02-27 ENCOUNTER — Telehealth: Payer: Self-pay

## 2018-02-27 NOTE — Telephone Encounter (Signed)
Was unable to contact patient with newly scheduled appointment. Per 8/6 sch msg. Will mail letter with calender enclosed.

## 2018-03-12 ENCOUNTER — Telehealth: Payer: Self-pay

## 2018-03-12 ENCOUNTER — Inpatient Hospital Stay: Payer: Medicare HMO

## 2018-03-12 ENCOUNTER — Inpatient Hospital Stay: Payer: Medicare HMO | Admitting: Hematology

## 2018-03-12 NOTE — Telephone Encounter (Signed)
I called [facility] where the patient lives.  I left a message regarding patients missed appointment this morning.  I advised them to call us to reschedule.

## 2018-03-13 ENCOUNTER — Non-Acute Institutional Stay (SKILLED_NURSING_FACILITY): Payer: Medicare HMO | Admitting: Internal Medicine

## 2018-03-13 ENCOUNTER — Encounter: Payer: Self-pay | Admitting: Internal Medicine

## 2018-03-13 DIAGNOSIS — F482 Pseudobulbar affect: Secondary | ICD-10-CM | POA: Diagnosis not present

## 2018-03-13 DIAGNOSIS — B351 Tinea unguium: Secondary | ICD-10-CM | POA: Diagnosis not present

## 2018-03-13 DIAGNOSIS — C8588 Other specified types of non-Hodgkin lymphoma, lymph nodes of multiple sites: Secondary | ICD-10-CM | POA: Diagnosis not present

## 2018-03-13 DIAGNOSIS — I739 Peripheral vascular disease, unspecified: Secondary | ICD-10-CM | POA: Diagnosis not present

## 2018-03-13 DIAGNOSIS — L84 Corns and callosities: Secondary | ICD-10-CM | POA: Diagnosis not present

## 2018-03-13 NOTE — Assessment & Plan Note (Addendum)
Recent change in room assignment seems to have exacerbated his mental status issues. This was discussed with DON Psych to assess need for psychotropic meds

## 2018-03-13 NOTE — Progress Notes (Signed)
NURSING HOME LOCATION:  [facility] ROOM NUMBER:  130-B  CODE STATUS:  Full Code  PCP:  Hendricks Limes, MD  [place] [address]  This is a nursing facility follow up of chronic medical diagnoses.    Interim medical record and care since last [facility] visit was updated with review of diagnostic studies and change in clinical status since last visit were documented.  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  He was last seen 11/27/2017; CBC, CMP and reticulocytes were normal.  MCV was elevated at 101.2. There was no clinical or lab progression of DLBCL and no further treatment was recommended.  He was to be seen every 3 months. Yearly flu vaccinations were recommended. Prevnar and Pneumovax immunizations were recommended every 5 years by Dr Irene Limbo.  Review of systems: He denies any active symptoms.  Staff reports that he is eating slower than usual.  He specifically denies dysphagia, dyspepsia, or abdominal pain. Staff also reports that he seems more confused since his room assignment changed.  He repeatedly will ask where the bathroom is.  He has been found urinating in the facility's outside garden. Despite this, serial mental status testing with the [facility] has shown improvement in the score from 7 out of 30 up to 14 out of 30. He denies any active symptoms.  Constitutional: No fever, significant weight change, fatigue  Eyes: No redness, discharge, pain, vision change ENT/mouth: No nasal congestion,  purulent discharge, earache, change in hearing, sore throat  Cardiovascular: No chest pain, palpitations, paroxysmal nocturnal dyspnea, claudication, edema  Respiratory: No cough, sputum  production, hemoptysis, DOE, significant snoring, apnea   Gastrointestinal: No heartburn, dysphagia, abdominal pain, nausea /vomiting, rectal bleeding, melena, change in bowels Genitourinary: No dysuria, hematuria, pyuria, incontinence, nocturia Musculoskeletal: No joint stiffness, joint swelling, weakness, pain Dermatologic: No rash, pruritus, change in appearance of skin Neurologic: No dizziness, headache, syncope, seizures, numbness, tingling Psychiatric: No significant anxiety, depression, insomnia, anorexia Endocrine: No change in hair/skin/nails, excessive thirst, excessive hunger, excessive urination  Hematologic/lymphatic: No significant bruising, lymphadenopathy, abnormal bleeding Allergy/immunology: No itchy/watery eyes, significant sneezing, urticaria, angioedema  Physical exam:  Pertinent or positive findings: He is disheveled and unshaven.  His speech pattern is unfocused and rambling and sentences incomplete.  The sentences are interlaced with profanity and he frequently ends his comments with  "shut up, Shanon Brow". He has mild erythema across the nose with exfoliation.  No definite cellulitis.  Breath sounds are decreased.  Pedal pulses are decreased.  He is symmetrically strong in all extremities.  He is wearing an anti-wandering bracelet over the left ankle.  General appearance:  no acute distress, increased work of breathing is present.   Lymphatic: No lymphadenopathy about the head, neck, axilla. Eyes: No conjunctival inflammation or lid edema is present. There is no scleral icterus. Ears:  External ear exam shows no significant lesions or deformities.   Nose:  External nasal examination shows no deformity or inflammation. Nasal mucosa are pink and moist without lesions, exudates Oral exam:  Lips and gums are healthy appearing. There is no oropharyngeal erythema or exudate. Neck:  No thyromegaly, masses, tenderness noted.    Heart:  Normal rate and regular rhythm. S1 and S2  normal without  gallop, murmur, click, rub .  Lungs:  without wheezes, rhonchi, rales, rubs. Abdomen: Bowel sounds are normal. Abdomen is soft and nontender with no organomegaly, hernias, masses. GU: Deferred  Extremities:  No cyanosis, clubbing, edema  Skin: Warm & dry w/o tenting.  See summary under each active problem in the Problem List with associated updated therapeutic plan

## 2018-03-13 NOTE — Assessment & Plan Note (Signed)
Dr Grier Mitts 11/27/17 note reviewed; no evidence of active disease Monitor every 3 months Flu shot annually Prevnar & Pneumovax every 5 years

## 2018-03-14 ENCOUNTER — Encounter: Payer: Self-pay | Admitting: Internal Medicine

## 2018-03-14 NOTE — Patient Instructions (Signed)
See assessment and plan under each diagnosis in the problem list and acutely for this visit 

## 2018-03-23 DIAGNOSIS — Z9189 Other specified personal risk factors, not elsewhere classified: Secondary | ICD-10-CM | POA: Diagnosis not present

## 2018-04-03 DIAGNOSIS — Z9189 Other specified personal risk factors, not elsewhere classified: Secondary | ICD-10-CM | POA: Diagnosis not present

## 2018-04-05 ENCOUNTER — Non-Acute Institutional Stay (SKILLED_NURSING_FACILITY): Payer: Medicare HMO | Admitting: Adult Health

## 2018-04-05 ENCOUNTER — Encounter: Payer: Self-pay | Admitting: Adult Health

## 2018-04-05 DIAGNOSIS — Z87820 Personal history of traumatic brain injury: Secondary | ICD-10-CM

## 2018-04-05 DIAGNOSIS — L219 Seborrheic dermatitis, unspecified: Secondary | ICD-10-CM

## 2018-04-05 DIAGNOSIS — M1A9XX0 Chronic gout, unspecified, without tophus (tophi): Secondary | ICD-10-CM | POA: Diagnosis not present
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
## 2018-04-17 DIAGNOSIS — Z1331 Encounter for screening for depression: Secondary | ICD-10-CM | POA: Diagnosis not present

## 2018-05-03 ENCOUNTER — Non-Acute Institutional Stay (SKILLED_NURSING_FACILITY): Payer: Medicare HMO | Admitting: Adult Health

## 2018-05-03 ENCOUNTER — Encounter: Payer: Self-pay | Admitting: Adult Health

## 2018-05-03 DIAGNOSIS — L219 Seborrheic dermatitis, unspecified: Secondary | ICD-10-CM | POA: Diagnosis not present

## 2018-05-03 DIAGNOSIS — M1A9XX0 Chronic gout, unspecified, without tophus (tophi): Secondary | ICD-10-CM

## 2018-05-03 DIAGNOSIS — Z87820 Personal history of traumatic brain injury: Secondary | ICD-10-CM | POA: Diagnosis not present
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
## 2018-05-24 ENCOUNTER — Non-Acute Institutional Stay (SKILLED_NURSING_FACILITY): Payer: Medicare HMO

## 2018-05-24 DIAGNOSIS — Z0000 Encounter for general adult medical examination without abnormal findings: Secondary | ICD-10-CM
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
## 2018-05-24 NOTE — Patient Instructions (Addendum)
Tyler [name] , I have completed the annual wellness visit as per Medicare guidelines. The attached information is provided for the patient's family, care providers, and facility of residence.  Screening recommendations/referrals: Colonoscopy due, ordered Recommended yearly ophthalmology/optometry visit for glaucoma screening and checkup Recommended yearly dental visit for hygiene and checkup  Vaccinations: Influenza vaccine due, will [facility] Pneumococcal vaccine up to date, completed Tdap vaccine due, ordered Shingles vaccine not in past records    Advanced directives: in chart  Conditions/risks identified: Fall Risk  Next appointment: Dr. Linna Darner makes rounds  Preventive Care 70 Years and Older, Male Preventive care refers to lifestyle choices and visits with your health care provider that can promote health and wellness. What does preventive care include?  A yearly physical exam. This is also called an annual well check.  Dental exams once or twice a year.  Routine eye exams. Ask your health care provider how often you should have your eyes checked.  Personal lifestyle choices, including:  Daily care of your teeth and gums.  Regular physical activity.  Eating a healthy diet.  Avoiding tobacco and drug use.  Limiting alcohol use.  Taking vitamin and mineral supplements as recommended by your health care provider. What happens during an annual well check? The services and screenings done by your health care provider during your annual well check will depend on your age, overall health, lifestyle risk factors, and family history of disease. Counseling  Your health care provider may ask you questions about your:  Alcohol use.  Tobacco use.  Drug use.  Emotional well-being.  Home and relationship well-being.  Sexual activity.  Eating habits.  History of falls.  Memory and ability to understand (cognition).  Work and work Statistician. Screening   You may have the following tests or measurements:  Height, weight, and BMI.  Blood pressure.  Lipid and cholesterol levels. These may be checked every 5 years, or more frequently if you are over 45 years old.  Skin check.  Lung cancer screening. You may have this screening every year starting at age 72 if you have a 30-pack-year history of smoking and currently smoke or have quit within the past 15 years.  Fecal occult blood test (FOBT) of the stool. You may have this test every year starting at age 71.  Flexible sigmoidoscopy or colonoscopy. You may have a sigmoidoscopy every 5 years or a colonoscopy every 10 years starting at age 18.  Prostate cancer screening. Recommendations will vary depending on your family history and other risks.  Hepatitis C blood test.  Hepatitis B blood test.  Diabetes screening. This is done by checking your blood sugar (glucose) after you have not eaten for a while (fasting). You may have this done every 1-3 years.  Abdominal aortic aneurysm (AAA) screening. You may need this if you are a current or former smoker.  Osteoporosis. You may be screened starting at age 85 if you are at high risk. Talk with your health care provider about your test results, treatment options, and if necessary, the need for more tests. Vaccines  Your health care provider may recommend certain vaccines, such as:  Influenza vaccine. This is recommended every year.  Tetanus, diphtheria, and acellular pertussis (Tdap, Td) vaccine. You may need a Td booster every 10 years.  Zoster vaccine. You may need this after age 18.  Pneumococcal 13-valent conjugate (PCV13) vaccine. One dose is recommended after age 56.  Pneumococcal polysaccharide (PPSV23) vaccine. One dose is recommended after age  62. Talk to your health care provider about which screenings and vaccines you need and how often you need them. This information is not intended to replace advice given to you by your  health care provider. Make sure you discuss any questions you have with your health care provider. Document Released: 08/07/2015 Document Revised: 03/30/2016 Document Reviewed: 05/12/2015 Elsevier Interactive Patient Education  2017 [place] can cause injuries. They can happen to people of all ages. There are many things you can do to make your home safe and to help prevent falls.  What can I do in the bathroom?  Use night lights.  Install grab bars by the toilet and in the tub and shower. Do not use towel bars as grab bars.  Use non-skid mats or decals in the tub or shower.  If you need to sit down in the shower, use a plastic, non-slip stool.  Keep the floor dry. Clean up any water that spills on the floor as soon as it happens.  Remove soap buildup in the tub or shower regularly.  Attach bath mats securely with double-sided non-slip rug tape.  Do not have throw rugs and other things on the floor that can make you trip. What can I do in the bedroom?  Use night lights.  Make sure that you have a light by your bed that is easy to reach.  Do not use any sheets or blankets that are too big for your bed. They should not hang down onto the floor.  Have a firm chair that has side arms. You can use this for support while you get dressed.  Do not have throw rugs and other things on the floor that can make you trip. What else can I do to help prevent falls?  Wear shoes that:  Do not have high heels.  Have rubber bottoms.  Are comfortable and fit you well.  Are closed at the toe. Do not wear sandals.  If you use a stepladder:  Make sure that it is fully opened. Do not climb a closed stepladder.  Make sure that both sides of the stepladder are locked into place.  Ask someone to hold it for you, if possible.  Clearly mark and make sure that you can see:  Any grab bars or handrails.  First and last steps.  Where the edge of each step  is.  Use tools that help you move around (mobility aids) if they are needed. These include:  Canes.  Walkers.  Scooters.  Crutches.  Turn on the lights when you go into a dark area. Replace any light bulbs as soon as they burn out.  Set up your furniture so you have a clear path. Avoid moving your furniture around.  If any of your floors are uneven, fix them.  Review your medicines with your doctor. Some medicines can make you feel dizzy. This can increase your chance of falling. Ask your doctor what other things that you can do to help prevent falls. This information is not intended to replace advice given to you by your health care provider. Make sure you discuss any questions you have with your health care provider. Document Released: 05/07/2009 Document Revised: 12/17/2015 Document Reviewed: 08/15/2014 Elsevier Interactive Patient Education  2017 [employer].

## 2018-05-28 DIAGNOSIS — H04123 Dry eye syndrome of bilateral lacrimal glands: Secondary | ICD-10-CM | POA: Diagnosis not present

## 2018-05-28 DIAGNOSIS — H02839 Dermatochalasis of unspecified eye, unspecified eyelid: Secondary | ICD-10-CM | POA: Diagnosis not present

## 2018-05-28 DIAGNOSIS — S062X9S Diffuse traumatic brain injury with loss of consciousness of unspecified duration, sequela: Secondary | ICD-10-CM | POA: Diagnosis not present

## 2018-05-28 DIAGNOSIS — H2513 Age-related nuclear cataract, bilateral: Secondary | ICD-10-CM | POA: Diagnosis not present

## 2018-06-06 DIAGNOSIS — C8518 Unspecified B-cell lymphoma, lymph nodes of multiple sites: Secondary | ICD-10-CM | POA: Diagnosis not present

## 2018-06-06 DIAGNOSIS — M6281 Muscle weakness (generalized): Secondary | ICD-10-CM | POA: Diagnosis not present

## 2018-06-06 DIAGNOSIS — R2689 Other abnormalities of gait and mobility: Secondary | ICD-10-CM | POA: Diagnosis not present

## 2018-06-06 DIAGNOSIS — Z87820 Personal history of traumatic brain injury: Secondary | ICD-10-CM | POA: Diagnosis not present

## 2018-06-07 ENCOUNTER — Non-Acute Institutional Stay (SKILLED_NURSING_FACILITY): Payer: Medicare HMO | Admitting: Internal Medicine

## 2018-06-07 ENCOUNTER — Encounter: Payer: Self-pay | Admitting: Internal Medicine

## 2018-06-07 DIAGNOSIS — D7589 Other specified diseases of blood and blood-forming organs: Secondary | ICD-10-CM | POA: Diagnosis not present

## 2018-06-07 DIAGNOSIS — C8588 Other specified types of non-Hodgkin lymphoma, lymph nodes of multiple sites: Secondary | ICD-10-CM

## 2018-06-07 DIAGNOSIS — F482 Pseudobulbar affect: Secondary | ICD-10-CM | POA: Diagnosis not present

## 2018-06-07 NOTE — Patient Instructions (Signed)
See assessment and plan under each diagnosis in the problem list and acutely for this visit 

## 2018-06-07 NOTE — Assessment & Plan Note (Signed)
Macrocytosis is improving serially, I will defer B12 assessment to Dr. Irene Limbo

## 2018-06-07 NOTE — Progress Notes (Signed)
    NURSING HOME LOCATION:  [facility] ROOM NUMBER:  [id number]  CODE STATUS:  Full Code  PCP:  Hendricks Limes, MD  [place] [address]  This is a nursing facility follow up of chronic medical diagnoses.  Interim medical record and care since last [place] visit was updated with review of diagnostic studies and change in clinical status since last visit were documented.  HPI: He is a permanent resident of the SNF with medical diagnoses of traumatic brain injury with pseudobulbar affect & language and communication issues.  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  He was to be followed every 3 months by Dr. Irene Limbo, but the last visit on record was in May of this year.  That is the date of his last lab studies; blood counts and chemistries were stable with no clinical lab progression of DLBCL.  At that visit Dr. Irene Limbo recommended annual flu vaccinations as well as Prevnar and Pneumovax vaccinations every 5 years.  Review of systems: Dementia invalidated responses. Date given as January or February  3rd or 5, 2013.  His responses were nonsensical and garbled and unfocused in reference to questions.  He denies any active symptoms ,especially constitutional or infectious symptoms.  He was unable to remember seeing Dr. Irene Limbo in May.  Physical exam:  Pertinent or positive findings: He was initially asleep but could be aroused.  Hair is disheveled. Thought process or lack of such as outlined above.  He has plaque over his teeth.  Heart sounds are distant.  Breath sounds are decreased.  General appearance: Adequately nourished; no acute distress, increased work of breathing is present.   Lymphatic: No lymphadenopathy about the head, neck, axilla. Eyes: No conjunctival inflammation or lid edema is present. There is no scleral icterus. Ears:  External ear exam shows no significant lesions or  deformities.   Nose:  External nasal examination shows no deformity or inflammation. Nasal mucosa are pink and moist without lesions, exudates Oral exam:  Lips and gums are healthy appearing. There is no oropharyngeal erythema or exudate. Neck:  No thyromegaly, masses, tenderness noted.    Heart:  Normal rate and regular rhythm. S1 and S2 normal without gallop, murmur, click, rub .  Lungs:  without wheezes, rhonchi, rales, rubs. Abdomen: Bowel sounds are normal. Abdomen is soft and nontender with no organomegaly, hernias, masses. GU: Deferred  Extremities:  No cyanosis, clubbing, edema  Neurologic exam : Strength equal  in upper & lower extremities Deep tendon reflexes are equal Skin: Warm & dry w/o tenting. No significant lesions or rash.  See summary under each active problem in the Problem List with associated updated therapeutic plan

## 2018-06-07 NOTE — Assessment & Plan Note (Addendum)
Follow-up with Dr. Irene Limbo; as per schedule he would have been seen in August. Verify administration of flu, Prevnar and Pneumovax vaccines as recommended by Dr. Irene Limbo

## 2018-06-07 NOTE — Assessment & Plan Note (Signed)
Neuropsychiatric status stable without behavioral issues

## 2018-06-08 DIAGNOSIS — Z87820 Personal history of traumatic brain injury: Secondary | ICD-10-CM | POA: Diagnosis not present

## 2018-06-08 DIAGNOSIS — M6281 Muscle weakness (generalized): Secondary | ICD-10-CM | POA: Diagnosis not present

## 2018-06-08 DIAGNOSIS — C8518 Unspecified B-cell lymphoma, lymph nodes of multiple sites: Secondary | ICD-10-CM | POA: Diagnosis not present

## 2018-06-08 DIAGNOSIS — R2689 Other abnormalities of gait and mobility: Secondary | ICD-10-CM | POA: Diagnosis not present

## 2018-06-12 DIAGNOSIS — Z87820 Personal history of traumatic brain injury: Secondary | ICD-10-CM | POA: Diagnosis not present

## 2018-06-12 DIAGNOSIS — R2689 Other abnormalities of gait and mobility: Secondary | ICD-10-CM | POA: Diagnosis not present

## 2018-06-12 DIAGNOSIS — C8518 Unspecified B-cell lymphoma, lymph nodes of multiple sites: Secondary | ICD-10-CM | POA: Diagnosis not present

## 2018-06-12 DIAGNOSIS — M6281 Muscle weakness (generalized): Secondary | ICD-10-CM | POA: Diagnosis not present

## 2018-06-13 DIAGNOSIS — Z87820 Personal history of traumatic brain injury: Secondary | ICD-10-CM | POA: Diagnosis not present

## 2018-06-13 DIAGNOSIS — C8518 Unspecified B-cell lymphoma, lymph nodes of multiple sites: Secondary | ICD-10-CM | POA: Diagnosis not present

## 2018-06-13 DIAGNOSIS — M6281 Muscle weakness (generalized): Secondary | ICD-10-CM | POA: Diagnosis not present

## 2018-06-13 DIAGNOSIS — R2689 Other abnormalities of gait and mobility: Secondary | ICD-10-CM | POA: Diagnosis not present

## 2018-06-18 DIAGNOSIS — M6281 Muscle weakness (generalized): Secondary | ICD-10-CM | POA: Diagnosis not present

## 2018-06-18 DIAGNOSIS — R2689 Other abnormalities of gait and mobility: Secondary | ICD-10-CM | POA: Diagnosis not present

## 2018-06-18 DIAGNOSIS — C8518 Unspecified B-cell lymphoma, lymph nodes of multiple sites: Secondary | ICD-10-CM | POA: Diagnosis not present

## 2018-06-18 DIAGNOSIS — Z87820 Personal history of traumatic brain injury: Secondary | ICD-10-CM | POA: Diagnosis not present

## 2018-06-19 DIAGNOSIS — C8518 Unspecified B-cell lymphoma, lymph nodes of multiple sites: Secondary | ICD-10-CM | POA: Diagnosis not present

## 2018-06-19 DIAGNOSIS — M6281 Muscle weakness (generalized): Secondary | ICD-10-CM | POA: Diagnosis not present

## 2018-06-19 DIAGNOSIS — Z87820 Personal history of traumatic brain injury: Secondary | ICD-10-CM | POA: Diagnosis not present

## 2018-06-19 DIAGNOSIS — R2689 Other abnormalities of gait and mobility: Secondary | ICD-10-CM | POA: Diagnosis not present

## 2018-06-22 DIAGNOSIS — Z87820 Personal history of traumatic brain injury: Secondary | ICD-10-CM | POA: Diagnosis not present

## 2018-06-22 DIAGNOSIS — C8518 Unspecified B-cell lymphoma, lymph nodes of multiple sites: Secondary | ICD-10-CM | POA: Diagnosis not present

## 2018-06-22 DIAGNOSIS — M6281 Muscle weakness (generalized): Secondary | ICD-10-CM | POA: Diagnosis not present

## 2018-06-22 DIAGNOSIS — R2689 Other abnormalities of gait and mobility: Secondary | ICD-10-CM | POA: Diagnosis not present

## 2018-06-23 DIAGNOSIS — M6281 Muscle weakness (generalized): Secondary | ICD-10-CM | POA: Diagnosis not present

## 2018-06-23 DIAGNOSIS — C8518 Unspecified B-cell lymphoma, lymph nodes of multiple sites: Secondary | ICD-10-CM | POA: Diagnosis not present

## 2018-06-23 DIAGNOSIS — Z87820 Personal history of traumatic brain injury: Secondary | ICD-10-CM | POA: Diagnosis not present

## 2018-06-23 DIAGNOSIS — R2689 Other abnormalities of gait and mobility: Secondary | ICD-10-CM | POA: Diagnosis not present

## 2018-06-25 DIAGNOSIS — Z87820 Personal history of traumatic brain injury: Secondary | ICD-10-CM | POA: Diagnosis not present

## 2018-06-25 DIAGNOSIS — M6281 Muscle weakness (generalized): Secondary | ICD-10-CM | POA: Diagnosis not present

## 2018-06-26 DIAGNOSIS — Z87820 Personal history of traumatic brain injury: Secondary | ICD-10-CM | POA: Diagnosis not present

## 2018-06-26 DIAGNOSIS — M6281 Muscle weakness (generalized): Secondary | ICD-10-CM | POA: Diagnosis not present

## 2018-06-27 DIAGNOSIS — Z87820 Personal history of traumatic brain injury: Secondary | ICD-10-CM | POA: Diagnosis not present

## 2018-06-27 DIAGNOSIS — M6281 Muscle weakness (generalized): Secondary | ICD-10-CM | POA: Diagnosis not present

## 2018-06-28 ENCOUNTER — Encounter: Payer: Self-pay | Admitting: Adult Health

## 2018-06-28 ENCOUNTER — Non-Acute Institutional Stay (SKILLED_NURSING_FACILITY): Payer: Medicare HMO | Admitting: Adult Health

## 2018-06-28 DIAGNOSIS — Z87820 Personal history of traumatic brain injury: Secondary | ICD-10-CM | POA: Diagnosis not present

## 2018-06-28 DIAGNOSIS — Z7189 Other specified counseling: Secondary | ICD-10-CM

## 2018-06-28 DIAGNOSIS — M109 Gout, unspecified: Secondary | ICD-10-CM | POA: Diagnosis not present

## 2018-06-28 DIAGNOSIS — M6281 Muscle weakness (generalized): Secondary | ICD-10-CM | POA: Diagnosis not present
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
## 2018-07-16 DIAGNOSIS — I739 Peripheral vascular disease, unspecified: Secondary | ICD-10-CM | POA: Diagnosis not present

## 2018-07-16 DIAGNOSIS — B351 Tinea unguium: Secondary | ICD-10-CM | POA: Diagnosis not present

## 2018-07-26 ENCOUNTER — Non-Acute Institutional Stay (SKILLED_NURSING_FACILITY): Payer: Medicare HMO | Admitting: Adult Health

## 2018-07-26 DIAGNOSIS — M109 Gout, unspecified: Secondary | ICD-10-CM | POA: Diagnosis not present

## 2018-07-26 DIAGNOSIS — Z8739 Personal history of other diseases of the musculoskeletal system and connective tissue: Secondary | ICD-10-CM

## 2018-07-26 DIAGNOSIS — Z87820 Personal history of traumatic brain injury: Secondary | ICD-10-CM

## 2018-07-26 DIAGNOSIS — Z5329 Procedure and treatment not carried out because of patient's decision for other reasons: Secondary | ICD-10-CM | POA: Diagnosis not present
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
## 2018-08-08 DIAGNOSIS — N390 Urinary tract infection, site not specified: Secondary | ICD-10-CM | POA: Diagnosis not present

## 2018-08-08 DIAGNOSIS — F419 Anxiety disorder, unspecified: Secondary | ICD-10-CM | POA: Diagnosis not present

## 2018-08-08 DIAGNOSIS — F329 Major depressive disorder, single episode, unspecified: Secondary | ICD-10-CM | POA: Diagnosis not present

## 2018-08-08 DIAGNOSIS — Z79899 Other long term (current) drug therapy: Secondary | ICD-10-CM | POA: Diagnosis not present

## 2018-08-18 ENCOUNTER — Other Ambulatory Visit: Payer: Self-pay | Admitting: Nurse Practitioner

## 2018-08-20 DIAGNOSIS — D649 Anemia, unspecified: Secondary | ICD-10-CM | POA: Diagnosis not present

## 2018-08-20 DIAGNOSIS — J111 Influenza due to unidentified influenza virus with other respiratory manifestations: Secondary | ICD-10-CM | POA: Diagnosis not present

## 2018-08-20 DIAGNOSIS — R0989 Other specified symptoms and signs involving the circulatory and respiratory systems: Secondary | ICD-10-CM | POA: Diagnosis not present

## 2018-08-20 DIAGNOSIS — R05 Cough: Secondary | ICD-10-CM | POA: Diagnosis not present

## 2018-08-20 DIAGNOSIS — Z79899 Other long term (current) drug therapy: Secondary | ICD-10-CM | POA: Diagnosis not present

## 2018-08-20 LAB — HEPATIC FUNCTION PANEL
ALK PHOS: 83 (ref 25–125)
ALT: 16 (ref 10–40)
AST: 24 (ref 14–40)
BILIRUBIN, TOTAL: 0.6

## 2018-08-20 LAB — BASIC METABOLIC PANEL
BUN: 18 (ref 4–21)
Creatinine: 0.7 (ref 0.6–1.3)
Glucose: 107
Potassium: 4.5 (ref 3.4–5.3)
Sodium: 135 — AB (ref 137–147)

## 2018-08-21 ENCOUNTER — Non-Acute Institutional Stay (SKILLED_NURSING_FACILITY): Payer: Medicare HMO | Admitting: Adult Health

## 2018-08-21 ENCOUNTER — Encounter: Payer: Self-pay | Admitting: Adult Health

## 2018-08-21 DIAGNOSIS — D649 Anemia, unspecified: Secondary | ICD-10-CM | POA: Diagnosis not present

## 2018-08-21 DIAGNOSIS — R5383 Other fatigue: Secondary | ICD-10-CM | POA: Diagnosis not present

## 2018-08-21 DIAGNOSIS — Z20828 Contact with and (suspected) exposure to other viral communicable diseases: Secondary | ICD-10-CM | POA: Diagnosis not present

## 2018-08-21 LAB — CBC AND DIFFERENTIAL
HEMATOCRIT: 41 (ref 41–53)
HEMOGLOBIN: 14.6 (ref 13.5–17.5)
Neutrophils Absolute: 4
Platelets: 222 (ref 150–399)
WBC: 5.7
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
## 2018-08-28 ENCOUNTER — Encounter: Payer: Self-pay | Admitting: Internal Medicine

## 2018-08-28 ENCOUNTER — Non-Acute Institutional Stay (SKILLED_NURSING_FACILITY): Payer: Medicare HMO | Admitting: Internal Medicine

## 2018-08-28 DIAGNOSIS — L821 Other seborrheic keratosis: Secondary | ICD-10-CM | POA: Insufficient documentation

## 2018-08-28 DIAGNOSIS — Z87820 Personal history of traumatic brain injury: Secondary | ICD-10-CM

## 2018-08-28 DIAGNOSIS — C8588 Other specified types of non-Hodgkin lymphoma, lymph nodes of multiple sites: Secondary | ICD-10-CM | POA: Diagnosis not present
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
## 2018-08-28 NOTE — Assessment & Plan Note (Addendum)
Staff reports no behavioral issues.  The pseudobulbar affect picture is clinically stable.

## 2018-08-28 NOTE — Assessment & Plan Note (Signed)
Aveeno daily Moisturizing Lotion qd

## 2018-08-28 NOTE — Assessment & Plan Note (Addendum)
Oncology attempted to schedule follow-up appointment x2.  Apparently facility was not aware of scheduled appts to arrange transportation. SNF fax number will be provided to oncology to prevent this from happening again

## 2018-08-28 NOTE — Patient Instructions (Signed)
See assessment and plan under each diagnosis in the problem list and acutely for this visit 

## 2018-08-29 ENCOUNTER — Encounter: Payer: Self-pay | Admitting: Internal Medicine

## 2018-08-31 DIAGNOSIS — F419 Anxiety disorder, unspecified: Secondary | ICD-10-CM | POA: Diagnosis not present

## 2018-08-31 DIAGNOSIS — F320 Major depressive disorder, single episode, mild: Secondary | ICD-10-CM | POA: Diagnosis not present

## 2018-09-03 DIAGNOSIS — D72829 Elevated white blood cell count, unspecified: Secondary | ICD-10-CM | POA: Diagnosis not present

## 2018-09-03 DIAGNOSIS — C8518 Unspecified B-cell lymphoma, lymph nodes of multiple sites: Secondary | ICD-10-CM | POA: Diagnosis not present

## 2018-09-03 DIAGNOSIS — D649 Anemia, unspecified: Secondary | ICD-10-CM | POA: Diagnosis not present

## 2018-09-03 LAB — BASIC METABOLIC PANEL
BUN: 17 (ref 4–21)
CREATININE: 0.9 (ref 0.6–1.3)
GLUCOSE: 94
Potassium: 4.5 (ref 3.4–5.3)
Sodium: 140 (ref 137–147)

## 2018-09-03 LAB — CBC AND DIFFERENTIAL
HCT: 39 — AB (ref 41–53)
Hemoglobin: 13.6 (ref 13.5–17.5)
Neutrophils Absolute: 4
Platelets: 221 (ref 150–399)
WBC: 6

## 2018-09-06 DIAGNOSIS — M6281 Muscle weakness (generalized): Secondary | ICD-10-CM | POA: Diagnosis not present

## 2018-09-06 DIAGNOSIS — Z87820 Personal history of traumatic brain injury: Secondary | ICD-10-CM | POA: Diagnosis not present

## 2018-09-13 DIAGNOSIS — M6281 Muscle weakness (generalized): Secondary | ICD-10-CM | POA: Diagnosis not present

## 2018-09-13 DIAGNOSIS — Z87820 Personal history of traumatic brain injury: Secondary | ICD-10-CM | POA: Diagnosis not present

## 2018-09-14 DIAGNOSIS — M6281 Muscle weakness (generalized): Secondary | ICD-10-CM | POA: Diagnosis not present

## 2018-09-14 DIAGNOSIS — Z87820 Personal history of traumatic brain injury: Secondary | ICD-10-CM | POA: Diagnosis not present

## 2018-09-17 DIAGNOSIS — Z87820 Personal history of traumatic brain injury: Secondary | ICD-10-CM | POA: Diagnosis not present

## 2018-09-17 DIAGNOSIS — M6281 Muscle weakness (generalized): Secondary | ICD-10-CM | POA: Diagnosis not present

## 2018-09-18 DIAGNOSIS — M6281 Muscle weakness (generalized): Secondary | ICD-10-CM | POA: Diagnosis not present

## 2018-09-18 DIAGNOSIS — Z87820 Personal history of traumatic brain injury: Secondary | ICD-10-CM | POA: Diagnosis not present

## 2018-09-19 DIAGNOSIS — Z87820 Personal history of traumatic brain injury: Secondary | ICD-10-CM | POA: Diagnosis not present

## 2018-09-19 DIAGNOSIS — F320 Major depressive disorder, single episode, mild: Secondary | ICD-10-CM | POA: Diagnosis not present

## 2018-09-19 DIAGNOSIS — R4189 Other symptoms and signs involving cognitive functions and awareness: Secondary | ICD-10-CM | POA: Diagnosis not present

## 2018-09-19 DIAGNOSIS — F419 Anxiety disorder, unspecified: Secondary | ICD-10-CM | POA: Diagnosis not present

## 2018-09-19 DIAGNOSIS — M6281 Muscle weakness (generalized): Secondary | ICD-10-CM | POA: Diagnosis not present

## 2018-09-20 DIAGNOSIS — M6281 Muscle weakness (generalized): Secondary | ICD-10-CM | POA: Diagnosis not present

## 2018-09-20 DIAGNOSIS — Z87820 Personal history of traumatic brain injury: Secondary | ICD-10-CM | POA: Diagnosis not present

## 2018-09-22 DIAGNOSIS — M6281 Muscle weakness (generalized): Secondary | ICD-10-CM | POA: Diagnosis not present

## 2018-09-22 DIAGNOSIS — Z87820 Personal history of traumatic brain injury: Secondary | ICD-10-CM | POA: Diagnosis not present

## 2018-09-24 DIAGNOSIS — Z87820 Personal history of traumatic brain injury: Secondary | ICD-10-CM | POA: Diagnosis not present

## 2018-09-24 DIAGNOSIS — M6281 Muscle weakness (generalized): Secondary | ICD-10-CM | POA: Diagnosis not present

## 2018-09-27 ENCOUNTER — Non-Acute Institutional Stay (SKILLED_NURSING_FACILITY): Payer: Medicare HMO | Admitting: Internal Medicine

## 2018-09-27 ENCOUNTER — Encounter: Payer: Self-pay | Admitting: Internal Medicine

## 2018-09-27 DIAGNOSIS — Z87820 Personal history of traumatic brain injury: Secondary | ICD-10-CM | POA: Diagnosis not present

## 2018-09-27 DIAGNOSIS — M1A9XX0 Chronic gout, unspecified, without tophus (tophi): Secondary | ICD-10-CM | POA: Diagnosis not present

## 2018-09-27 DIAGNOSIS — C8588 Other specified types of non-Hodgkin lymphoma, lymph nodes of multiple sites: Secondary | ICD-10-CM | POA: Diagnosis not present
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
## 2018-10-04 DIAGNOSIS — F419 Anxiety disorder, unspecified: Secondary | ICD-10-CM | POA: Diagnosis not present

## 2018-10-04 DIAGNOSIS — R4189 Other symptoms and signs involving cognitive functions and awareness: Secondary | ICD-10-CM | POA: Diagnosis not present

## 2018-10-04 DIAGNOSIS — F320 Major depressive disorder, single episode, mild: Secondary | ICD-10-CM | POA: Diagnosis not present

## 2018-10-05 ENCOUNTER — Encounter: Payer: Self-pay | Admitting: Internal Medicine

## 2018-10-19 ENCOUNTER — Encounter: Payer: Self-pay | Admitting: Internal Medicine
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
## 2018-10-22 ENCOUNTER — Non-Acute Institutional Stay (SKILLED_NURSING_FACILITY): Payer: Medicare HMO | Admitting: Internal Medicine

## 2018-10-22 ENCOUNTER — Encounter: Payer: Self-pay | Admitting: Internal Medicine

## 2018-10-22 DIAGNOSIS — M1A9XX0 Chronic gout, unspecified, without tophus (tophi): Secondary | ICD-10-CM

## 2018-10-22 DIAGNOSIS — C8588 Other specified types of non-Hodgkin lymphoma, lymph nodes of multiple sites: Secondary | ICD-10-CM | POA: Diagnosis not present

## 2018-10-22 DIAGNOSIS — Z87820 Personal history of traumatic brain injury: Secondary | ICD-10-CM
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
## 2018-10-26 ENCOUNTER — Other Ambulatory Visit: Payer: Self-pay | Admitting: Internal Medicine

## 2018-11-21 ENCOUNTER — Encounter: Payer: Self-pay | Admitting: Internal Medicine

## 2018-11-21 ENCOUNTER — Non-Acute Institutional Stay (SKILLED_NURSING_FACILITY): Payer: Medicare HMO | Admitting: Internal Medicine

## 2018-11-21 DIAGNOSIS — F0390 Unspecified dementia without behavioral disturbance: Secondary | ICD-10-CM | POA: Diagnosis not present

## 2018-11-21 DIAGNOSIS — Z8739 Personal history of other diseases of the musculoskeletal system and connective tissue: Secondary | ICD-10-CM

## 2018-11-21 DIAGNOSIS — C8588 Other specified types of non-Hodgkin lymphoma, lymph nodes of multiple sites: Secondary | ICD-10-CM

## 2018-11-21 DIAGNOSIS — Z87820 Personal history of traumatic brain injury: Secondary | ICD-10-CM | POA: Diagnosis not present
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
## 2018-11-22 ENCOUNTER — Encounter: Payer: Self-pay | Admitting: Internal Medicine

## 2018-12-05 ENCOUNTER — Encounter: Payer: Self-pay | Admitting: Internal Medicine

## 2018-12-05 ENCOUNTER — Non-Acute Institutional Stay (SKILLED_NURSING_FACILITY): Payer: Medicare HMO | Admitting: Internal Medicine

## 2018-12-05 DIAGNOSIS — D539 Nutritional anemia, unspecified: Secondary | ICD-10-CM | POA: Diagnosis not present

## 2018-12-05 DIAGNOSIS — F482 Pseudobulbar affect: Secondary | ICD-10-CM | POA: Diagnosis not present

## 2018-12-05 DIAGNOSIS — S069X9S Unspecified intracranial injury with loss of consciousness of unspecified duration, sequela: Secondary | ICD-10-CM | POA: Diagnosis not present

## 2018-12-05 DIAGNOSIS — C8588 Other specified types of non-Hodgkin lymphoma, lymph nodes of multiple sites: Secondary | ICD-10-CM

## 2018-12-05 NOTE — Assessment & Plan Note (Signed)
09/03/2018 hemoglobin 13.6/hematocrit 39, down from prior values of 14.1/41.  White count and platelet count normal. CBC and differential will be repeated following Corona pandemic quarantine resolution.

## 2018-12-05 NOTE — Assessment & Plan Note (Addendum)
Psych NP continues to monitor; there had been some issue of noncompliance with personal hygiene .  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  He did not exhibit the coprolalia he has in the past. Neurology reassessment certainly reasonable once Dr Irene Limbo has re-evaluated him
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
## 2018-12-05 NOTE — Assessment & Plan Note (Signed)
Neurology follow-up is indicated once hematologic reassessment has been completed.  Unfortunately he is unable to give me the name of his most recent treating neurologist, yet he vividly remembers his interactions with his neurosurgeon in 1976.

## 2018-12-05 NOTE — Assessment & Plan Note (Addendum)
Oncology follow-up will be scheduled once Corona endemic quarantine is lifted. As noted patient is unaware of diagnosis.

## 2018-12-07 ENCOUNTER — Encounter: Payer: Self-pay | Admitting: Internal Medicine

## 2018-12-07 NOTE — Patient Instructions (Signed)
See assessment and plan under each diagnosis in the problem list and acutely for this visit 

## 2018-12-14 DIAGNOSIS — R4189 Other symptoms and signs involving cognitive functions and awareness: Secondary | ICD-10-CM | POA: Diagnosis not present

## 2018-12-14 DIAGNOSIS — F320 Major depressive disorder, single episode, mild: Secondary | ICD-10-CM | POA: Diagnosis not present

## 2018-12-14 DIAGNOSIS — F419 Anxiety disorder, unspecified: Secondary | ICD-10-CM | POA: Diagnosis not present

## 2019-01-01 DIAGNOSIS — M6281 Muscle weakness (generalized): Secondary | ICD-10-CM | POA: Diagnosis not present

## 2019-01-01 DIAGNOSIS — Z87820 Personal history of traumatic brain injury: Secondary | ICD-10-CM | POA: Diagnosis not present

## 2019-01-01 DIAGNOSIS — C8518 Unspecified B-cell lymphoma, lymph nodes of multiple sites: Secondary | ICD-10-CM | POA: Diagnosis not present

## 2019-01-02 ENCOUNTER — Encounter: Payer: Self-pay | Admitting: Internal Medicine

## 2019-01-02 ENCOUNTER — Non-Acute Institutional Stay (SKILLED_NURSING_FACILITY): Payer: Medicare HMO | Admitting: Internal Medicine

## 2019-01-02 DIAGNOSIS — F32A Depression, unspecified: Secondary | ICD-10-CM

## 2019-01-02 DIAGNOSIS — M1A9XX0 Chronic gout, unspecified, without tophus (tophi): Secondary | ICD-10-CM | POA: Diagnosis not present

## 2019-01-02 DIAGNOSIS — C8588 Other specified types of non-Hodgkin lymphoma, lymph nodes of multiple sites: Secondary | ICD-10-CM

## 2019-01-02 DIAGNOSIS — C8518 Unspecified B-cell lymphoma, lymph nodes of multiple sites: Secondary | ICD-10-CM | POA: Diagnosis not present

## 2019-01-02 DIAGNOSIS — F329 Major depressive disorder, single episode, unspecified: Secondary | ICD-10-CM | POA: Diagnosis not present

## 2019-01-02 DIAGNOSIS — M6281 Muscle weakness (generalized): Secondary | ICD-10-CM | POA: Diagnosis not present

## 2019-01-02 DIAGNOSIS — Z87820 Personal history of traumatic brain injury: Secondary | ICD-10-CM | POA: Diagnosis not present
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
## 2019-01-03 DIAGNOSIS — Z87820 Personal history of traumatic brain injury: Secondary | ICD-10-CM | POA: Diagnosis not present

## 2019-01-03 DIAGNOSIS — M6281 Muscle weakness (generalized): Secondary | ICD-10-CM | POA: Diagnosis not present

## 2019-01-03 DIAGNOSIS — C8518 Unspecified B-cell lymphoma, lymph nodes of multiple sites: Secondary | ICD-10-CM | POA: Diagnosis not present

## 2019-01-04 DIAGNOSIS — M6281 Muscle weakness (generalized): Secondary | ICD-10-CM | POA: Diagnosis not present

## 2019-01-04 DIAGNOSIS — Z87820 Personal history of traumatic brain injury: Secondary | ICD-10-CM | POA: Diagnosis not present

## 2019-01-04 DIAGNOSIS — C8518 Unspecified B-cell lymphoma, lymph nodes of multiple sites: Secondary | ICD-10-CM | POA: Diagnosis not present

## 2019-01-07 DIAGNOSIS — C8518 Unspecified B-cell lymphoma, lymph nodes of multiple sites: Secondary | ICD-10-CM | POA: Diagnosis not present

## 2019-01-07 DIAGNOSIS — Z87820 Personal history of traumatic brain injury: Secondary | ICD-10-CM | POA: Diagnosis not present

## 2019-01-07 DIAGNOSIS — M6281 Muscle weakness (generalized): Secondary | ICD-10-CM | POA: Diagnosis not present

## 2019-01-08 DIAGNOSIS — M6281 Muscle weakness (generalized): Secondary | ICD-10-CM | POA: Diagnosis not present

## 2019-01-08 DIAGNOSIS — C8518 Unspecified B-cell lymphoma, lymph nodes of multiple sites: Secondary | ICD-10-CM | POA: Diagnosis not present

## 2019-01-08 DIAGNOSIS — Z87820 Personal history of traumatic brain injury: Secondary | ICD-10-CM | POA: Diagnosis not present

## 2019-01-09 DIAGNOSIS — C8518 Unspecified B-cell lymphoma, lymph nodes of multiple sites: Secondary | ICD-10-CM | POA: Diagnosis not present

## 2019-01-09 DIAGNOSIS — Z87820 Personal history of traumatic brain injury: Secondary | ICD-10-CM | POA: Diagnosis not present

## 2019-01-09 DIAGNOSIS — M6281 Muscle weakness (generalized): Secondary | ICD-10-CM | POA: Diagnosis not present

## 2019-01-10 DIAGNOSIS — C8518 Unspecified B-cell lymphoma, lymph nodes of multiple sites: Secondary | ICD-10-CM | POA: Diagnosis not present

## 2019-01-10 DIAGNOSIS — M6281 Muscle weakness (generalized): Secondary | ICD-10-CM | POA: Diagnosis not present

## 2019-01-10 DIAGNOSIS — Z87820 Personal history of traumatic brain injury: Secondary | ICD-10-CM | POA: Diagnosis not present

## 2019-01-11 DIAGNOSIS — M6281 Muscle weakness (generalized): Secondary | ICD-10-CM | POA: Diagnosis not present

## 2019-01-11 DIAGNOSIS — C8518 Unspecified B-cell lymphoma, lymph nodes of multiple sites: Secondary | ICD-10-CM | POA: Diagnosis not present

## 2019-01-11 DIAGNOSIS — Z87820 Personal history of traumatic brain injury: Secondary | ICD-10-CM | POA: Diagnosis not present

## 2019-01-14 DIAGNOSIS — M6281 Muscle weakness (generalized): Secondary | ICD-10-CM | POA: Diagnosis not present

## 2019-01-14 DIAGNOSIS — Z87820 Personal history of traumatic brain injury: Secondary | ICD-10-CM | POA: Diagnosis not present

## 2019-01-14 DIAGNOSIS — C8518 Unspecified B-cell lymphoma, lymph nodes of multiple sites: Secondary | ICD-10-CM | POA: Diagnosis not present

## 2019-01-15 DIAGNOSIS — Z87820 Personal history of traumatic brain injury: Secondary | ICD-10-CM | POA: Diagnosis not present

## 2019-01-15 DIAGNOSIS — M6281 Muscle weakness (generalized): Secondary | ICD-10-CM | POA: Diagnosis not present

## 2019-01-15 DIAGNOSIS — C8518 Unspecified B-cell lymphoma, lymph nodes of multiple sites: Secondary | ICD-10-CM | POA: Diagnosis not present

## 2019-01-16 DIAGNOSIS — C8518 Unspecified B-cell lymphoma, lymph nodes of multiple sites: Secondary | ICD-10-CM | POA: Diagnosis not present

## 2019-01-16 DIAGNOSIS — Z87820 Personal history of traumatic brain injury: Secondary | ICD-10-CM | POA: Diagnosis not present

## 2019-01-16 DIAGNOSIS — M6281 Muscle weakness (generalized): Secondary | ICD-10-CM | POA: Diagnosis not present

## 2019-01-17 DIAGNOSIS — Z87820 Personal history of traumatic brain injury: Secondary | ICD-10-CM | POA: Diagnosis not present

## 2019-01-17 DIAGNOSIS — C8518 Unspecified B-cell lymphoma, lymph nodes of multiple sites: Secondary | ICD-10-CM | POA: Diagnosis not present

## 2019-01-17 DIAGNOSIS — M6281 Muscle weakness (generalized): Secondary | ICD-10-CM | POA: Diagnosis not present

## 2019-01-18 DIAGNOSIS — C8518 Unspecified B-cell lymphoma, lymph nodes of multiple sites: Secondary | ICD-10-CM | POA: Diagnosis not present

## 2019-01-18 DIAGNOSIS — M6281 Muscle weakness (generalized): Secondary | ICD-10-CM | POA: Diagnosis not present

## 2019-01-18 DIAGNOSIS — Z87820 Personal history of traumatic brain injury: Secondary | ICD-10-CM | POA: Diagnosis not present

## 2019-01-21 DIAGNOSIS — Z87820 Personal history of traumatic brain injury: Secondary | ICD-10-CM | POA: Diagnosis not present

## 2019-01-21 DIAGNOSIS — C8518 Unspecified B-cell lymphoma, lymph nodes of multiple sites: Secondary | ICD-10-CM | POA: Diagnosis not present

## 2019-01-21 DIAGNOSIS — M6281 Muscle weakness (generalized): Secondary | ICD-10-CM | POA: Diagnosis not present

## 2019-01-22 DIAGNOSIS — Z87820 Personal history of traumatic brain injury: Secondary | ICD-10-CM | POA: Diagnosis not present

## 2019-01-22 DIAGNOSIS — M6281 Muscle weakness (generalized): Secondary | ICD-10-CM | POA: Diagnosis not present

## 2019-01-22 DIAGNOSIS — C8518 Unspecified B-cell lymphoma, lymph nodes of multiple sites: Secondary | ICD-10-CM | POA: Diagnosis not present

## 2019-01-23 DIAGNOSIS — Z95820 Peripheral vascular angioplasty status with implants and grafts: Secondary | ICD-10-CM | POA: Diagnosis not present

## 2019-01-23 DIAGNOSIS — C8518 Unspecified B-cell lymphoma, lymph nodes of multiple sites: Secondary | ICD-10-CM | POA: Diagnosis not present

## 2019-01-23 DIAGNOSIS — M6281 Muscle weakness (generalized): Secondary | ICD-10-CM | POA: Diagnosis not present

## 2019-01-24 DIAGNOSIS — C8518 Unspecified B-cell lymphoma, lymph nodes of multiple sites: Secondary | ICD-10-CM | POA: Diagnosis not present

## 2019-01-24 DIAGNOSIS — Z95820 Peripheral vascular angioplasty status with implants and grafts: Secondary | ICD-10-CM | POA: Diagnosis not present

## 2019-01-24 DIAGNOSIS — M6281 Muscle weakness (generalized): Secondary | ICD-10-CM | POA: Diagnosis not present

## 2019-01-25 DIAGNOSIS — M6281 Muscle weakness (generalized): Secondary | ICD-10-CM | POA: Diagnosis not present

## 2019-01-25 DIAGNOSIS — Z95820 Peripheral vascular angioplasty status with implants and grafts: Secondary | ICD-10-CM | POA: Diagnosis not present

## 2019-01-25 DIAGNOSIS — C8518 Unspecified B-cell lymphoma, lymph nodes of multiple sites: Secondary | ICD-10-CM | POA: Diagnosis not present

## 2019-01-28 ENCOUNTER — Non-Acute Institutional Stay (SKILLED_NURSING_FACILITY): Payer: Medicare HMO | Admitting: Internal Medicine

## 2019-01-28 ENCOUNTER — Encounter: Payer: Self-pay | Admitting: Internal Medicine

## 2019-01-28 DIAGNOSIS — C8518 Unspecified B-cell lymphoma, lymph nodes of multiple sites: Secondary | ICD-10-CM | POA: Diagnosis not present

## 2019-01-28 DIAGNOSIS — C8588 Other specified types of non-Hodgkin lymphoma, lymph nodes of multiple sites: Secondary | ICD-10-CM

## 2019-01-28 DIAGNOSIS — Z8739 Personal history of other diseases of the musculoskeletal system and connective tissue: Secondary | ICD-10-CM

## 2019-01-28 DIAGNOSIS — Z95820 Peripheral vascular angioplasty status with implants and grafts: Secondary | ICD-10-CM | POA: Diagnosis not present

## 2019-01-28 DIAGNOSIS — Z87820 Personal history of traumatic brain injury: Secondary | ICD-10-CM | POA: Diagnosis not present

## 2019-01-28 DIAGNOSIS — M6281 Muscle weakness (generalized): Secondary | ICD-10-CM | POA: Diagnosis not present

## 2019-01-28 DIAGNOSIS — F3289 Other specified depressive episodes: Secondary | ICD-10-CM
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
## 2019-01-29 ENCOUNTER — Encounter: Payer: Self-pay | Admitting: Internal Medicine

## 2019-02-15 DIAGNOSIS — B342 Coronavirus infection, unspecified: Secondary | ICD-10-CM | POA: Diagnosis not present

## 2019-02-18 ENCOUNTER — Non-Acute Institutional Stay (SKILLED_NURSING_FACILITY): Payer: Medicare HMO | Admitting: Adult Health

## 2019-02-18 ENCOUNTER — Encounter: Payer: Self-pay | Admitting: Adult Health

## 2019-02-18 DIAGNOSIS — Z1211 Encounter for screening for malignant neoplasm of colon: Secondary | ICD-10-CM

## 2019-02-18 DIAGNOSIS — F068 Other specified mental disorders due to known physiological condition: Secondary | ICD-10-CM | POA: Diagnosis not present

## 2019-02-18 DIAGNOSIS — F419 Anxiety disorder, unspecified: Secondary | ICD-10-CM | POA: Diagnosis not present

## 2019-02-18 DIAGNOSIS — M1A9XX0 Chronic gout, unspecified, without tophus (tophi): Secondary | ICD-10-CM

## 2019-02-18 DIAGNOSIS — S069X0S Unspecified intracranial injury without loss of consciousness, sequela: Secondary | ICD-10-CM | POA: Diagnosis not present

## 2019-02-18 DIAGNOSIS — R4189 Other symptoms and signs involving cognitive functions and awareness: Secondary | ICD-10-CM

## 2019-02-18 DIAGNOSIS — M109 Gout, unspecified: Secondary | ICD-10-CM | POA: Insufficient documentation
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
## 2019-03-11 ENCOUNTER — Encounter: Payer: Self-pay | Admitting: Internal Medicine

## 2019-03-11 ENCOUNTER — Non-Acute Institutional Stay (SKILLED_NURSING_FACILITY): Payer: Medicare HMO | Admitting: Internal Medicine

## 2019-03-11 DIAGNOSIS — L719 Rosacea, unspecified: Secondary | ICD-10-CM | POA: Diagnosis not present

## 2019-03-11 DIAGNOSIS — Z87820 Personal history of traumatic brain injury: Secondary | ICD-10-CM | POA: Diagnosis not present

## 2019-03-11 DIAGNOSIS — M1A9XX0 Chronic gout, unspecified, without tophus (tophi): Secondary | ICD-10-CM

## 2019-03-11 DIAGNOSIS — C8588 Other specified types of non-Hodgkin lymphoma, lymph nodes of multiple sites: Secondary | ICD-10-CM
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
## 2019-03-11 NOTE — Patient Instructions (Signed)
See assessment and plan under each diagnosis in the problem list and acutely for this visit 
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
## 2019-03-11 NOTE — Assessment & Plan Note (Signed)
Patient denies constitutional symptoms.  He has no lymphadenopathy or organomegaly.  Hematology follow-up following lifting of the quarantine.

## 2019-03-11 NOTE — Assessment & Plan Note (Signed)
Previously noted coprolalia is not present today but he confabulates and is confused.  Neurology consultation will be pursued once quarantine is lifted.

## 2019-03-11 NOTE — Assessment & Plan Note (Addendum)
03/11/2019 patient denies any gouty arthritis symptoms.  No tophi noted. Recheck uric acid once quarantine lifted to see if the allopurinol dose can be decreased.  Also check renal function at that time.

## 2019-04-03 ENCOUNTER — Non-Acute Institutional Stay (SKILLED_NURSING_FACILITY): Payer: Medicare HMO | Admitting: Adult Health

## 2019-04-03 ENCOUNTER — Encounter: Payer: Self-pay | Admitting: Adult Health

## 2019-04-03 DIAGNOSIS — Z87820 Personal history of traumatic brain injury: Secondary | ICD-10-CM | POA: Diagnosis not present

## 2019-04-03 DIAGNOSIS — M1A9XX0 Chronic gout, unspecified, without tophus (tophi): Secondary | ICD-10-CM

## 2019-04-03 DIAGNOSIS — F320 Major depressive disorder, single episode, mild: Secondary | ICD-10-CM

## 2019-04-03 DIAGNOSIS — Z0000 Encounter for general adult medical examination without abnormal findings: Secondary | ICD-10-CM
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
## 2019-04-04 DIAGNOSIS — F339 Major depressive disorder, recurrent, unspecified: Secondary | ICD-10-CM | POA: Insufficient documentation

## 2019-04-04 DIAGNOSIS — Z0000 Encounter for general adult medical examination without abnormal findings: Secondary | ICD-10-CM | POA: Insufficient documentation

## 2019-04-25 ENCOUNTER — Non-Acute Institutional Stay (SKILLED_NURSING_FACILITY): Payer: Medicare HMO | Admitting: Adult Health

## 2019-04-25 ENCOUNTER — Encounter: Payer: Self-pay | Admitting: Adult Health

## 2019-04-25 DIAGNOSIS — L853 Xerosis cutis: Secondary | ICD-10-CM

## 2019-04-25 DIAGNOSIS — M1A9XX0 Chronic gout, unspecified, without tophus (tophi): Secondary | ICD-10-CM | POA: Diagnosis not present

## 2019-04-25 DIAGNOSIS — F068 Other specified mental disorders due to known physiological condition: Secondary | ICD-10-CM

## 2019-04-25 DIAGNOSIS — F339 Major depressive disorder, recurrent, unspecified: Secondary | ICD-10-CM

## 2019-04-25 DIAGNOSIS — S069X0S Unspecified intracranial injury without loss of consciousness, sequela: Secondary | ICD-10-CM | POA: Diagnosis not present
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
## 2019-04-29 DIAGNOSIS — L853 Xerosis cutis: Secondary | ICD-10-CM | POA: Insufficient documentation

## 2019-05-07 DIAGNOSIS — J069 Acute upper respiratory infection, unspecified: Secondary | ICD-10-CM | POA: Diagnosis not present

## 2019-05-14 DIAGNOSIS — J069 Acute upper respiratory infection, unspecified: Secondary | ICD-10-CM | POA: Diagnosis not present

## 2019-05-21 DIAGNOSIS — J069 Acute upper respiratory infection, unspecified: Secondary | ICD-10-CM | POA: Diagnosis not present

## 2019-05-23 ENCOUNTER — Encounter: Payer: Self-pay | Admitting: Internal Medicine

## 2019-05-23 ENCOUNTER — Non-Acute Institutional Stay (SKILLED_NURSING_FACILITY): Payer: Medicare HMO | Admitting: Internal Medicine

## 2019-05-23 DIAGNOSIS — L821 Other seborrheic keratosis: Secondary | ICD-10-CM

## 2019-05-23 DIAGNOSIS — F068 Other specified mental disorders due to known physiological condition: Secondary | ICD-10-CM

## 2019-05-23 DIAGNOSIS — C8588 Other specified types of non-Hodgkin lymphoma, lymph nodes of multiple sites: Secondary | ICD-10-CM | POA: Diagnosis not present

## 2019-05-23 DIAGNOSIS — M1A9XX0 Chronic gout, unspecified, without tophus (tophi): Secondary | ICD-10-CM | POA: Diagnosis not present

## 2019-05-23 DIAGNOSIS — S069X0S Unspecified intracranial injury without loss of consciousness, sequela: Secondary | ICD-10-CM

## 2019-05-23 DIAGNOSIS — F0390 Unspecified dementia without behavioral disturbance: Secondary | ICD-10-CM | POA: Diagnosis not present
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
## 2019-05-23 NOTE — Patient Instructions (Signed)
See assessment and plan under each diagnosis in the problem list and acutely for this visit 

## 2019-05-23 NOTE — Assessment & Plan Note (Signed)
On exam there is no suggestion of lymphadenopathy or organomegaly.
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
## 2019-05-24 NOTE — Assessment & Plan Note (Signed)
Again no gout symptoms Update labs (BMET & uric acid)

## 2019-05-24 NOTE — Assessment & Plan Note (Signed)
Well controlled at present

## 2019-05-27 ENCOUNTER — Encounter: Payer: Self-pay | Admitting: Adult Health

## 2019-05-27 ENCOUNTER — Non-Acute Institutional Stay (SKILLED_NURSING_FACILITY): Payer: Medicare HMO | Admitting: Adult Health

## 2019-05-27 DIAGNOSIS — Z0000 Encounter for general adult medical examination without abnormal findings: Secondary | ICD-10-CM | POA: Diagnosis not present
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
## 2019-06-13 ENCOUNTER — Non-Acute Institutional Stay (SKILLED_NURSING_FACILITY): Payer: Medicare HMO | Admitting: Adult Health

## 2019-06-13 ENCOUNTER — Encounter: Payer: Self-pay | Admitting: Adult Health

## 2019-06-13 DIAGNOSIS — F068 Other specified mental disorders due to known physiological condition: Secondary | ICD-10-CM | POA: Diagnosis not present

## 2019-06-13 DIAGNOSIS — F339 Major depressive disorder, recurrent, unspecified: Secondary | ICD-10-CM | POA: Diagnosis not present

## 2019-06-13 DIAGNOSIS — M1A9XX0 Chronic gout, unspecified, without tophus (tophi): Secondary | ICD-10-CM

## 2019-06-13 DIAGNOSIS — S069X0S Unspecified intracranial injury without loss of consciousness, sequela: Secondary | ICD-10-CM

## 2019-06-13 DIAGNOSIS — R4189 Other symptoms and signs involving cognitive functions and awareness: Secondary | ICD-10-CM
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
## 2019-07-04 ENCOUNTER — Encounter: Payer: Self-pay | Admitting: Adult Health

## 2019-07-04 ENCOUNTER — Non-Acute Institutional Stay (SKILLED_NURSING_FACILITY): Payer: Medicare HMO | Admitting: Adult Health

## 2019-07-04 DIAGNOSIS — Z87820 Personal history of traumatic brain injury: Secondary | ICD-10-CM | POA: Diagnosis not present

## 2019-07-04 DIAGNOSIS — F339 Major depressive disorder, recurrent, unspecified: Secondary | ICD-10-CM

## 2019-07-04 DIAGNOSIS — L853 Xerosis cutis: Secondary | ICD-10-CM

## 2019-07-04 DIAGNOSIS — M1A9XX0 Chronic gout, unspecified, without tophus (tophi): Secondary | ICD-10-CM
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
## 2019-07-26 IMAGING — XA IR FLUORO GUIDE CV LINE*R*
1 series · 1 of 1 positions shown · non-contrast
Comparison: none

INDICATION: 66-year-old male with high-grade non-Hodgkin's B-cell lymphoma. He
presents for portacatheter placement.

[Series 300: line placements · 1 of 1 slices shown]
[im 1/1]
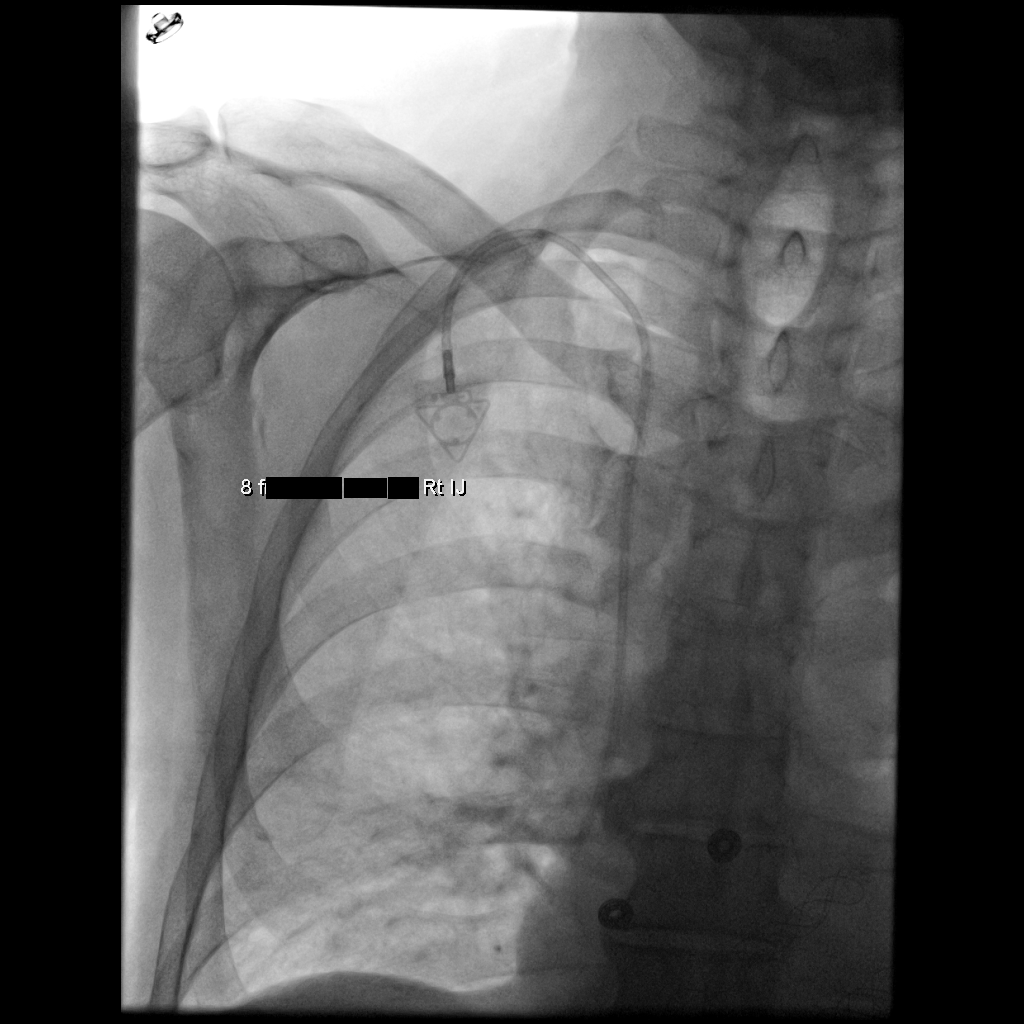

[1 of 1 positions shown; findings below may reference images not displayed]

EXAM:
IMPLANTED PORT A CATH PLACEMENT WITH ULTRASOUND AND FLUOROSCOPIC
GUIDANCE

MEDICATIONS:
2 g Ancef; The antibiotic was administered within an appropriate
time interval prior to skin puncture.

ANESTHESIA/SEDATION:
Versed 2 mg IV; Fentanyl 100 mcg IV;

Moderate Sedation Time:  23 minutes

The patient was continuously monitored during the procedure by the
interventional radiology nurse under my direct supervision.

FLUOROSCOPY TIME:  0 minutes, 6 seconds (1 mGy)

COMPLICATIONS:
None immediate.

PROCEDURE:
The right neck and chest was prepped with chlorhexidine, and draped
in the usual sterile fashion using maximum barrier technique (cap
and mask, sterile gown, sterile gloves, large sterile sheet, hand
hygiene and cutaneous antiseptic). Antibiotic prophylaxis was
provided with 2g Ancef administered IV one hour prior to skin
incision. Local anesthesia was attained by infiltration with 1%
lidocaine with epinephrine.

Ultrasound demonstrated patency of the right internal jugular vein,
and this was documented with an image. Under real-time ultrasound
guidance, this vein was accessed with a 21 gauge micropuncture
needle and image documentation was performed. A small dermatotomy
was made at the access site with an 11 scalpel. A 0.018" wire was
advanced into the SVC and the access needle exchanged for a 4F
micropuncture vascular sheath. The 0.018" wire was then removed and
a 0.035" wire advanced into the IVC.



The venous access site was then serially dilated and a peel away
vascular sheath placed over the wire. The wire was removed and the
port catheter advanced into position under fluoroscopic guidance.
The catheter tip is positioned in the superior cavoatrial junction.
This was documented with a spot image. The portacatheter was then
tested and found to flush and aspirate well. The port was flushed
with saline followed by 100 units/mL heparinized saline.

The pocket was then closed in two layers using first subdermal
inverted interrupted absorbable sutures followed by a running
subcuticular suture. The epidermis was then sealed with Dermabond.
The dermatotomy at the venous access site was also closed with a
single inverted subdermal suture and the epidermis sealed with
Dermabond.
IMPRESSION: Successful placement of a right IJ approach Power Port with
ultrasound and fluoroscopic guidance. The catheter is ready for use.

## 2019-08-08 ENCOUNTER — Encounter: Payer: Self-pay | Admitting: Internal Medicine

## 2019-08-08 ENCOUNTER — Non-Acute Institutional Stay (SKILLED_NURSING_FACILITY): Payer: Medicare HMO | Admitting: Internal Medicine

## 2019-08-08 DIAGNOSIS — C8588 Other specified types of non-Hodgkin lymphoma, lymph nodes of multiple sites: Secondary | ICD-10-CM | POA: Diagnosis not present

## 2019-08-08 DIAGNOSIS — S069X0S Unspecified intracranial injury without loss of consciousness, sequela: Secondary | ICD-10-CM

## 2019-08-08 DIAGNOSIS — F339 Major depressive disorder, recurrent, unspecified: Secondary | ICD-10-CM | POA: Diagnosis not present

## 2019-08-08 DIAGNOSIS — F39 Unspecified mood [affective] disorder: Secondary | ICD-10-CM | POA: Diagnosis not present

## 2019-08-08 DIAGNOSIS — E278 Other specified disorders of adrenal gland: Secondary | ICD-10-CM | POA: Diagnosis not present

## 2019-08-08 DIAGNOSIS — F068 Other specified mental disorders due to known physiological condition: Secondary | ICD-10-CM | POA: Diagnosis not present

## 2019-08-08 NOTE — Patient Instructions (Signed)
See assessment and plan under each diagnosis in the problem list and acutely for this visit 

## 2019-08-08 NOTE — Assessment & Plan Note (Signed)
Check CBC & dif
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
## 2019-08-09 DIAGNOSIS — Z79899 Other long term (current) drug therapy: Secondary | ICD-10-CM | POA: Diagnosis not present

## 2019-08-09 DIAGNOSIS — D649 Anemia, unspecified: Secondary | ICD-10-CM | POA: Diagnosis not present

## 2019-08-09 LAB — CBC AND DIFFERENTIAL
HCT: 47 (ref 41–53)
Hemoglobin: 16 (ref 13.5–17.5)
Platelets: 234 (ref 150–399)
WBC: 6.1

## 2019-08-09 LAB — BASIC METABOLIC PANEL
BUN: 17 (ref 4–21)
CO2: 25 — AB (ref 13–22)
Chloride: 104 (ref 99–108)
Creatinine: 0.8 (ref 0.6–1.3)
Glucose: 107
Potassium: 4.6 (ref 3.4–5.3)
Sodium: 139 (ref 137–147)

## 2019-08-09 LAB — COMPREHENSIVE METABOLIC PANEL
Calcium: 9.1 (ref 8.7–10.7)
GFR calc Af Amer: >90
GFR calc non Af Amer: >90

## 2019-08-09 LAB — CBC: RBC: 4.57 (ref 3.87–5.11)

## 2019-08-27 ENCOUNTER — Encounter: Payer: Self-pay | Admitting: Adult Health

## 2019-08-27 ENCOUNTER — Non-Acute Institutional Stay (SKILLED_NURSING_FACILITY): Payer: Medicare HMO | Admitting: Adult Health

## 2019-08-27 DIAGNOSIS — U071 COVID-19: Secondary | ICD-10-CM

## 2019-08-27 DIAGNOSIS — C8518 Unspecified B-cell lymphoma, lymph nodes of multiple sites: Secondary | ICD-10-CM | POA: Diagnosis not present

## 2019-08-27 DIAGNOSIS — D649 Anemia, unspecified: Secondary | ICD-10-CM | POA: Diagnosis not present

## 2019-08-27 DIAGNOSIS — D72829 Elevated white blood cell count, unspecified: Secondary | ICD-10-CM | POA: Diagnosis not present

## 2019-08-27 LAB — COMPREHENSIVE METABOLIC PANEL
Calcium: 9 (ref 8.7–10.7)
GFR calc Af Amer: >90
GFR calc non Af Amer: >90

## 2019-08-27 LAB — CBC: RBC: 4.36 (ref 3.87–5.11)

## 2019-08-27 LAB — BASIC METABOLIC PANEL
BUN: 16 (ref 4–21)
CO2: 23 — AB (ref 13–22)
Chloride: 103 (ref 99–108)
Creatinine: 0.8 (ref 0.6–1.3)
Glucose: 112
Potassium: 4.7 (ref 3.4–5.3)
Sodium: 137 (ref 137–147)

## 2019-08-27 LAB — CBC AND DIFFERENTIAL
HCT: 45 (ref 41–53)
Hemoglobin: 15.3 (ref 13.5–17.5)
Platelets: 241 (ref 150–399)
WBC: 9.3
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
## 2019-08-30 ENCOUNTER — Non-Acute Institutional Stay (SKILLED_NURSING_FACILITY): Payer: Medicare HMO | Admitting: Adult Health

## 2019-08-30 ENCOUNTER — Encounter: Payer: Self-pay | Admitting: Adult Health

## 2019-08-30 DIAGNOSIS — U071 COVID-19: Secondary | ICD-10-CM | POA: Diagnosis not present

## 2019-08-30 DIAGNOSIS — F339 Major depressive disorder, recurrent, unspecified: Secondary | ICD-10-CM | POA: Diagnosis not present

## 2019-08-30 DIAGNOSIS — M1A9XX0 Chronic gout, unspecified, without tophus (tophi): Secondary | ICD-10-CM
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
## 2019-09-02 DIAGNOSIS — M6281 Muscle weakness (generalized): Secondary | ICD-10-CM | POA: Diagnosis not present

## 2019-09-02 DIAGNOSIS — R2681 Unsteadiness on feet: Secondary | ICD-10-CM | POA: Diagnosis not present

## 2019-09-02 DIAGNOSIS — R41841 Cognitive communication deficit: Secondary | ICD-10-CM | POA: Diagnosis not present

## 2019-09-02 DIAGNOSIS — R1311 Dysphagia, oral phase: Secondary | ICD-10-CM | POA: Diagnosis not present

## 2019-09-03 DIAGNOSIS — R1311 Dysphagia, oral phase: Secondary | ICD-10-CM | POA: Diagnosis not present

## 2019-09-03 DIAGNOSIS — M6281 Muscle weakness (generalized): Secondary | ICD-10-CM | POA: Diagnosis not present

## 2019-09-03 DIAGNOSIS — R2681 Unsteadiness on feet: Secondary | ICD-10-CM | POA: Diagnosis not present

## 2019-09-03 DIAGNOSIS — R41841 Cognitive communication deficit: Secondary | ICD-10-CM | POA: Diagnosis not present

## 2019-09-04 DIAGNOSIS — M6281 Muscle weakness (generalized): Secondary | ICD-10-CM | POA: Diagnosis not present

## 2019-09-04 DIAGNOSIS — R1311 Dysphagia, oral phase: Secondary | ICD-10-CM | POA: Diagnosis not present

## 2019-09-04 DIAGNOSIS — R2681 Unsteadiness on feet: Secondary | ICD-10-CM | POA: Diagnosis not present

## 2019-09-04 DIAGNOSIS — R41841 Cognitive communication deficit: Secondary | ICD-10-CM | POA: Diagnosis not present

## 2019-09-06 DIAGNOSIS — R2681 Unsteadiness on feet: Secondary | ICD-10-CM | POA: Diagnosis not present

## 2019-09-06 DIAGNOSIS — R1311 Dysphagia, oral phase: Secondary | ICD-10-CM | POA: Diagnosis not present

## 2019-09-06 DIAGNOSIS — M6281 Muscle weakness (generalized): Secondary | ICD-10-CM | POA: Diagnosis not present

## 2019-09-06 DIAGNOSIS — R41841 Cognitive communication deficit: Secondary | ICD-10-CM | POA: Diagnosis not present

## 2019-09-08 DIAGNOSIS — M6281 Muscle weakness (generalized): Secondary | ICD-10-CM | POA: Diagnosis not present

## 2019-09-08 DIAGNOSIS — R41841 Cognitive communication deficit: Secondary | ICD-10-CM | POA: Diagnosis not present

## 2019-09-08 DIAGNOSIS — R2681 Unsteadiness on feet: Secondary | ICD-10-CM | POA: Diagnosis not present

## 2019-09-08 DIAGNOSIS — R1311 Dysphagia, oral phase: Secondary | ICD-10-CM | POA: Diagnosis not present

## 2019-09-09 DIAGNOSIS — R41841 Cognitive communication deficit: Secondary | ICD-10-CM | POA: Diagnosis not present

## 2019-09-09 DIAGNOSIS — M6281 Muscle weakness (generalized): Secondary | ICD-10-CM | POA: Diagnosis not present

## 2019-09-09 DIAGNOSIS — R1311 Dysphagia, oral phase: Secondary | ICD-10-CM | POA: Diagnosis not present

## 2019-09-09 DIAGNOSIS — R2681 Unsteadiness on feet: Secondary | ICD-10-CM | POA: Diagnosis not present

## 2019-09-11 DIAGNOSIS — R2681 Unsteadiness on feet: Secondary | ICD-10-CM | POA: Diagnosis not present

## 2019-09-11 DIAGNOSIS — R1311 Dysphagia, oral phase: Secondary | ICD-10-CM | POA: Diagnosis not present

## 2019-09-11 DIAGNOSIS — M6281 Muscle weakness (generalized): Secondary | ICD-10-CM | POA: Diagnosis not present

## 2019-09-11 DIAGNOSIS — R41841 Cognitive communication deficit: Secondary | ICD-10-CM | POA: Diagnosis not present

## 2019-09-11 IMAGING — CT NM PET TUM IMG INITIAL (PI) SKULL BASE T - THIGH
1 of 8 series · 1 of 25 positions shown · non-contrast
Comparison: CT abdomen pelvis 03/29/2017 and CT chest 03/28/2017.

CLINICAL DATA: Initial treatment strategy for lymphoma.

EXAM:
NUCLEAR MEDICINE PET SKULL BASE TO THIGH
TECHNIQUE: 7.7 mCi F-18 FDG was injected intravenously. Full-ring PET imaging
was performed from the skull base to thigh after the radiotracer. CT
data was obtained and used for attenuation correction and anatomic
localization.
FASTING BLOOD GLUCOSE:  Value: 101 mg/dl

[Series 3: pet sk_thigh ac · axial · 5.0mm · 4.07mm/px · 1 of 220 slices shown]
[im 147/220]
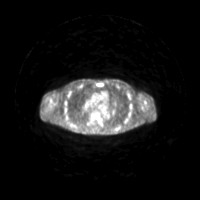

[1 of 25 positions shown; findings below may reference images not displayed]

FINDINGS: NECK: Level [DATE] lymph nodes on the left and an SUV max of 2.3. CT
images show no acute findings.

CHEST: No hypermetabolic mediastinal, hilar or axillary lymph nodes.
No hypermetabolic pulmonary nodules.

Right IJ Port-A-Cath terminates in the low SVC. No pericardial or
pleural effusion.

ABDOMEN/PELVIS: No abnormal hypermetabolism in the liver, adrenal
glands, spleen or pancreas. Hazy abdominal retroperitoneal lymph
nodes are mildly hypermetabolic. Index left periaortic lymph node
measures 1.6 cm (CT image 132) with an SUV max of 3.0.

Liver, gallbladder, adrenal glands, kidneys, spleen, pancreas,
stomach and bowel are grossly unremarkable.

SKELETON: There is patchy hypermetabolism throughout the visualized
osseous structures.
IMPRESSION: 1. Overall decreased bulk of abdominal retroperitoneal adenopathy
with residual mildly hypermetabolic abdominal retroperitoneal lymph
nodes. Mildly hypermetabolic small left neck lymph nodes.
2. Left lower lobe nodule has resolved.
3. Patchy hypermetabolism throughout the visualized osseous
structures may be treatment related and superimposed on known lytic
destruction of T12, L4 and the posteromedial left twelfth rib.

## 2019-09-13 DIAGNOSIS — R1311 Dysphagia, oral phase: Secondary | ICD-10-CM | POA: Diagnosis not present

## 2019-09-13 DIAGNOSIS — R41841 Cognitive communication deficit: Secondary | ICD-10-CM | POA: Diagnosis not present

## 2019-09-13 DIAGNOSIS — M6281 Muscle weakness (generalized): Secondary | ICD-10-CM | POA: Diagnosis not present

## 2019-09-13 DIAGNOSIS — R2681 Unsteadiness on feet: Secondary | ICD-10-CM | POA: Diagnosis not present

## 2019-09-14 DIAGNOSIS — M6281 Muscle weakness (generalized): Secondary | ICD-10-CM | POA: Diagnosis not present

## 2019-09-14 DIAGNOSIS — R41841 Cognitive communication deficit: Secondary | ICD-10-CM | POA: Diagnosis not present

## 2019-09-14 DIAGNOSIS — R1311 Dysphagia, oral phase: Secondary | ICD-10-CM | POA: Diagnosis not present

## 2019-09-14 DIAGNOSIS — R2681 Unsteadiness on feet: Secondary | ICD-10-CM | POA: Diagnosis not present

## 2019-09-16 DIAGNOSIS — R41841 Cognitive communication deficit: Secondary | ICD-10-CM | POA: Diagnosis not present

## 2019-09-16 DIAGNOSIS — R1311 Dysphagia, oral phase: Secondary | ICD-10-CM | POA: Diagnosis not present

## 2019-09-16 DIAGNOSIS — R2681 Unsteadiness on feet: Secondary | ICD-10-CM | POA: Diagnosis not present

## 2019-09-16 DIAGNOSIS — M6281 Muscle weakness (generalized): Secondary | ICD-10-CM | POA: Diagnosis not present

## 2019-09-17 DIAGNOSIS — R1311 Dysphagia, oral phase: Secondary | ICD-10-CM | POA: Diagnosis not present

## 2019-09-17 DIAGNOSIS — R2681 Unsteadiness on feet: Secondary | ICD-10-CM | POA: Diagnosis not present

## 2019-09-17 DIAGNOSIS — M6281 Muscle weakness (generalized): Secondary | ICD-10-CM | POA: Diagnosis not present

## 2019-09-17 DIAGNOSIS — R41841 Cognitive communication deficit: Secondary | ICD-10-CM | POA: Diagnosis not present

## 2019-09-18 ENCOUNTER — Non-Acute Institutional Stay (SKILLED_NURSING_FACILITY): Payer: Medicare HMO | Admitting: Adult Health

## 2019-09-18 ENCOUNTER — Encounter: Payer: Self-pay | Admitting: Adult Health

## 2019-09-18 DIAGNOSIS — R1311 Dysphagia, oral phase: Secondary | ICD-10-CM | POA: Diagnosis not present

## 2019-09-18 DIAGNOSIS — F339 Major depressive disorder, recurrent, unspecified: Secondary | ICD-10-CM | POA: Diagnosis not present

## 2019-09-18 DIAGNOSIS — L853 Xerosis cutis: Secondary | ICD-10-CM | POA: Diagnosis not present

## 2019-09-18 DIAGNOSIS — M1A9XX0 Chronic gout, unspecified, without tophus (tophi): Secondary | ICD-10-CM

## 2019-09-18 DIAGNOSIS — M6281 Muscle weakness (generalized): Secondary | ICD-10-CM | POA: Diagnosis not present

## 2019-09-18 DIAGNOSIS — R41841 Cognitive communication deficit: Secondary | ICD-10-CM | POA: Diagnosis not present

## 2019-09-18 DIAGNOSIS — R2681 Unsteadiness on feet: Secondary | ICD-10-CM | POA: Diagnosis not present
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
## 2019-09-19 DIAGNOSIS — R1311 Dysphagia, oral phase: Secondary | ICD-10-CM | POA: Diagnosis not present

## 2019-09-19 DIAGNOSIS — R2681 Unsteadiness on feet: Secondary | ICD-10-CM | POA: Diagnosis not present

## 2019-09-19 DIAGNOSIS — M6281 Muscle weakness (generalized): Secondary | ICD-10-CM | POA: Diagnosis not present

## 2019-09-19 DIAGNOSIS — R41841 Cognitive communication deficit: Secondary | ICD-10-CM | POA: Diagnosis not present

## 2019-09-20 DIAGNOSIS — M6281 Muscle weakness (generalized): Secondary | ICD-10-CM | POA: Diagnosis not present

## 2019-09-20 DIAGNOSIS — R41841 Cognitive communication deficit: Secondary | ICD-10-CM | POA: Diagnosis not present

## 2019-09-20 DIAGNOSIS — R1311 Dysphagia, oral phase: Secondary | ICD-10-CM | POA: Diagnosis not present

## 2019-09-20 DIAGNOSIS — R2681 Unsteadiness on feet: Secondary | ICD-10-CM | POA: Diagnosis not present

## 2019-09-22 DIAGNOSIS — R2681 Unsteadiness on feet: Secondary | ICD-10-CM | POA: Diagnosis not present

## 2019-09-22 DIAGNOSIS — M6281 Muscle weakness (generalized): Secondary | ICD-10-CM | POA: Diagnosis not present

## 2019-09-22 DIAGNOSIS — R41841 Cognitive communication deficit: Secondary | ICD-10-CM | POA: Diagnosis not present

## 2019-09-22 DIAGNOSIS — R1311 Dysphagia, oral phase: Secondary | ICD-10-CM | POA: Diagnosis not present

## 2019-09-23 DIAGNOSIS — M6281 Muscle weakness (generalized): Secondary | ICD-10-CM | POA: Diagnosis not present

## 2019-09-23 DIAGNOSIS — U071 COVID-19: Secondary | ICD-10-CM | POA: Diagnosis not present

## 2019-09-23 DIAGNOSIS — C8518 Unspecified B-cell lymphoma, lymph nodes of multiple sites: Secondary | ICD-10-CM | POA: Diagnosis not present

## 2019-09-23 DIAGNOSIS — R41841 Cognitive communication deficit: Secondary | ICD-10-CM | POA: Diagnosis not present

## 2019-09-24 DIAGNOSIS — U071 COVID-19: Secondary | ICD-10-CM | POA: Diagnosis not present

## 2019-09-24 DIAGNOSIS — M6281 Muscle weakness (generalized): Secondary | ICD-10-CM | POA: Diagnosis not present

## 2019-09-24 DIAGNOSIS — C8518 Unspecified B-cell lymphoma, lymph nodes of multiple sites: Secondary | ICD-10-CM | POA: Diagnosis not present

## 2019-09-24 DIAGNOSIS — R41841 Cognitive communication deficit: Secondary | ICD-10-CM | POA: Diagnosis not present

## 2019-09-25 DIAGNOSIS — R41841 Cognitive communication deficit: Secondary | ICD-10-CM | POA: Diagnosis not present

## 2019-09-25 DIAGNOSIS — U071 COVID-19: Secondary | ICD-10-CM | POA: Diagnosis not present

## 2019-09-25 DIAGNOSIS — M6281 Muscle weakness (generalized): Secondary | ICD-10-CM | POA: Diagnosis not present

## 2019-09-25 DIAGNOSIS — C8518 Unspecified B-cell lymphoma, lymph nodes of multiple sites: Secondary | ICD-10-CM | POA: Diagnosis not present

## 2019-09-26 DIAGNOSIS — U071 COVID-19: Secondary | ICD-10-CM | POA: Diagnosis not present

## 2019-09-26 DIAGNOSIS — M6281 Muscle weakness (generalized): Secondary | ICD-10-CM | POA: Diagnosis not present

## 2019-09-26 DIAGNOSIS — R41841 Cognitive communication deficit: Secondary | ICD-10-CM | POA: Diagnosis not present

## 2019-09-26 DIAGNOSIS — C8518 Unspecified B-cell lymphoma, lymph nodes of multiple sites: Secondary | ICD-10-CM | POA: Diagnosis not present

## 2019-09-27 DIAGNOSIS — M6281 Muscle weakness (generalized): Secondary | ICD-10-CM | POA: Diagnosis not present

## 2019-09-27 DIAGNOSIS — C8518 Unspecified B-cell lymphoma, lymph nodes of multiple sites: Secondary | ICD-10-CM | POA: Diagnosis not present

## 2019-09-27 DIAGNOSIS — U071 COVID-19: Secondary | ICD-10-CM | POA: Diagnosis not present

## 2019-09-27 DIAGNOSIS — R41841 Cognitive communication deficit: Secondary | ICD-10-CM | POA: Diagnosis not present

## 2019-09-30 DIAGNOSIS — C8518 Unspecified B-cell lymphoma, lymph nodes of multiple sites: Secondary | ICD-10-CM | POA: Diagnosis not present

## 2019-09-30 DIAGNOSIS — R41841 Cognitive communication deficit: Secondary | ICD-10-CM | POA: Diagnosis not present

## 2019-09-30 DIAGNOSIS — U071 COVID-19: Secondary | ICD-10-CM | POA: Diagnosis not present

## 2019-09-30 DIAGNOSIS — M6281 Muscle weakness (generalized): Secondary | ICD-10-CM | POA: Diagnosis not present

## 2019-10-01 DIAGNOSIS — C8518 Unspecified B-cell lymphoma, lymph nodes of multiple sites: Secondary | ICD-10-CM | POA: Diagnosis not present

## 2019-10-01 DIAGNOSIS — R41841 Cognitive communication deficit: Secondary | ICD-10-CM | POA: Diagnosis not present

## 2019-10-01 DIAGNOSIS — U071 COVID-19: Secondary | ICD-10-CM | POA: Diagnosis not present

## 2019-10-01 DIAGNOSIS — M6281 Muscle weakness (generalized): Secondary | ICD-10-CM | POA: Diagnosis not present

## 2019-10-02 DIAGNOSIS — R41841 Cognitive communication deficit: Secondary | ICD-10-CM | POA: Diagnosis not present

## 2019-10-02 DIAGNOSIS — C8518 Unspecified B-cell lymphoma, lymph nodes of multiple sites: Secondary | ICD-10-CM | POA: Diagnosis not present

## 2019-10-02 DIAGNOSIS — M6281 Muscle weakness (generalized): Secondary | ICD-10-CM | POA: Diagnosis not present

## 2019-10-02 DIAGNOSIS — U071 COVID-19: Secondary | ICD-10-CM | POA: Diagnosis not present

## 2019-10-03 DIAGNOSIS — M6281 Muscle weakness (generalized): Secondary | ICD-10-CM | POA: Diagnosis not present

## 2019-10-03 DIAGNOSIS — U071 COVID-19: Secondary | ICD-10-CM | POA: Diagnosis not present

## 2019-10-03 DIAGNOSIS — C8518 Unspecified B-cell lymphoma, lymph nodes of multiple sites: Secondary | ICD-10-CM | POA: Diagnosis not present

## 2019-10-03 DIAGNOSIS — R41841 Cognitive communication deficit: Secondary | ICD-10-CM | POA: Diagnosis not present

## 2019-10-04 DIAGNOSIS — U071 COVID-19: Secondary | ICD-10-CM | POA: Diagnosis not present

## 2019-10-04 DIAGNOSIS — M6281 Muscle weakness (generalized): Secondary | ICD-10-CM | POA: Diagnosis not present

## 2019-10-04 DIAGNOSIS — R41841 Cognitive communication deficit: Secondary | ICD-10-CM | POA: Diagnosis not present

## 2019-10-04 DIAGNOSIS — C8518 Unspecified B-cell lymphoma, lymph nodes of multiple sites: Secondary | ICD-10-CM | POA: Diagnosis not present

## 2019-10-05 ENCOUNTER — Emergency Department (HOSPITAL_COMMUNITY)
Admission: EM | Admit: 2019-10-05 | Discharge: 2019-10-05 | Disposition: A | Discharge status: Home / Self Care | Payer: Medicare HMO | Attending: Emergency Medicine | Admitting: Emergency Medicine

## 2019-10-05 ENCOUNTER — Emergency Department (HOSPITAL_COMMUNITY): Payer: Medicare HMO

## 2019-10-05 ENCOUNTER — Other Ambulatory Visit: Payer: Self-pay

## 2019-10-05 DIAGNOSIS — I499 Cardiac arrhythmia, unspecified: Secondary | ICD-10-CM | POA: Diagnosis not present

## 2019-10-05 DIAGNOSIS — D72829 Elevated white blood cell count, unspecified: Secondary | ICD-10-CM | POA: Diagnosis not present

## 2019-10-05 DIAGNOSIS — R002 Palpitations: Secondary | ICD-10-CM | POA: Diagnosis present

## 2019-10-05 DIAGNOSIS — C8518 Unspecified B-cell lymphoma, lymph nodes of multiple sites: Secondary | ICD-10-CM | POA: Diagnosis not present

## 2019-10-05 DIAGNOSIS — I959 Hypotension, unspecified: Secondary | ICD-10-CM | POA: Diagnosis not present

## 2019-10-05 DIAGNOSIS — Z743 Need for continuous supervision: Secondary | ICD-10-CM | POA: Diagnosis not present

## 2019-10-05 DIAGNOSIS — R279 Unspecified lack of coordination: Secondary | ICD-10-CM | POA: Diagnosis not present

## 2019-10-05 DIAGNOSIS — I4891 Unspecified atrial fibrillation: Secondary | ICD-10-CM | POA: Diagnosis not present

## 2019-10-05 DIAGNOSIS — I471 Supraventricular tachycardia: Secondary | ICD-10-CM | POA: Insufficient documentation

## 2019-10-05 DIAGNOSIS — R001 Bradycardia, unspecified: Secondary | ICD-10-CM | POA: Diagnosis not present

## 2019-10-05 DIAGNOSIS — R000 Tachycardia, unspecified: Secondary | ICD-10-CM | POA: Diagnosis not present

## 2019-10-05 DIAGNOSIS — Z79899 Other long term (current) drug therapy: Secondary | ICD-10-CM | POA: Insufficient documentation

## 2019-10-05 DIAGNOSIS — R5381 Other malaise: Secondary | ICD-10-CM | POA: Diagnosis not present

## 2019-10-05 DIAGNOSIS — D649 Anemia, unspecified: Secondary | ICD-10-CM | POA: Diagnosis not present

## 2019-10-05 LAB — CBC WITH DIFFERENTIAL/PLATELET
Abs Immature Granulocytes: 0.05 10*3/uL (ref 0.00–0.07)
Basophils Absolute: 0 10*3/uL (ref 0.0–0.1)
Basophils Relative: 0 %
Eosinophils Absolute: 0.1 10*3/uL (ref 0.0–0.5)
Eosinophils Relative: 1 %
HCT: 44.7 % (ref 39.0–52.0)
Hemoglobin: 14.6 g/dL (ref 13.0–17.0)
Immature Granulocytes: 1 %
Lymphocytes Relative: 17 %
Lymphs Abs: 1.4 10*3/uL (ref 0.7–4.0)
MCH: 34.5 pg — ABNORMAL HIGH (ref 26.0–34.0)
MCHC: 32.7 g/dL (ref 30.0–36.0)
MCV: 105.7 fL — ABNORMAL HIGH (ref 80.0–100.0)
Monocytes Absolute: 1.2 10*3/uL — ABNORMAL HIGH (ref 0.1–1.0)
Monocytes Relative: 14 %
Neutro Abs: 5.3 10*3/uL (ref 1.7–7.7)
Neutrophils Relative %: 67 %
Platelets: 232 10*3/uL (ref 150–400)
RBC: 4.23 MIL/uL (ref 4.22–5.81)
RDW: 14.3 % (ref 11.5–15.5)
WBC: 8 10*3/uL (ref 4.0–10.5)
nRBC: 0 % (ref 0.0–0.2)

## 2019-10-05 LAB — COMPREHENSIVE METABOLIC PANEL
ALT: 20 U/L (ref 0–44)
AST: 19 U/L (ref 15–41)
Albumin: 3 g/dL — ABNORMAL LOW (ref 3.5–5.0)
Alkaline Phosphatase: 67 U/L (ref 38–126)
Anion gap: 9 (ref 5–15)
BUN: 14 mg/dL (ref 8–23)
CO2: 24 mmol/L (ref 22–32)
Calcium: 8.2 mg/dL — ABNORMAL LOW (ref 8.9–10.3)
Calcium: 9.2 (ref 8.7–10.7)
Chloride: 105 mmol/L (ref 98–111)
Creatinine, Ser: 1.08 mg/dL (ref 0.61–1.24)
GFR calc Af Amer: 90
GFR calc Af Amer: >60 mL/min (ref >60)
GFR calc non Af Amer: 88.6
GFR calc non Af Amer: >60 mL/min (ref >60)
Glucose, Bld: 100 mg/dL — ABNORMAL HIGH (ref 70–99)
Potassium: 4.3 mmol/L (ref 3.5–5.1)
Sodium: 138 mmol/L (ref 135–145)
Total Bilirubin: 0.5 mg/dL (ref 0.3–1.2)
Total Protein: 5.1 g/dL — ABNORMAL LOW (ref 6.5–8.1)

## 2019-10-05 LAB — BASIC METABOLIC PANEL
BUN: 13 (ref 4–21)
CO2: 24 — AB (ref 13–22)
Chloride: 101 (ref 99–108)
Creatinine: 0.8 (ref 0.6–1.3)
Glucose: 104
Potassium: 4.4 (ref 3.4–5.3)
Sodium: 136 — AB (ref 137–147)

## 2019-10-05 LAB — CBC AND DIFFERENTIAL
HCT: 47 (ref 41–53)
Hemoglobin: 15.9 (ref 13.5–17.5)
Neutrophils Absolute: 5
Platelets: 245 (ref 150–399)
WBC: 7.4

## 2019-10-05 LAB — CBC: RBC: 4.56 (ref 3.87–5.11)

## 2019-10-05 LAB — TROPONIN I (HIGH SENSITIVITY): Troponin I (High Sensitivity): 13 ng/L (ref <18)

## 2019-10-05 MED ORDER — ADENOSINE 6 MG/2ML IV SOLN
6.0000 mg | Freq: Once | INTRAVENOUS | Status: AC
  Administered 2019-10-05: 6 mg via INTRAVENOUS
  Filled 2019-10-05: qty 2
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
## 2019-10-05 NOTE — ED Notes (Signed)
Ordered dinner 

## 2019-10-05 NOTE — ED Triage Notes (Signed)
Pt BIB [facility] from [facility] for new onset a-fib. Per EMS patient has no history of a-fib but was found by staff at the facility with a heart rate of 140-160. EMS reports BP of 87/62 and HR 140-160. Pt is alert and oriented x4.   HR of 164 upon ED arrival  BP of 98/66

## 2019-10-05 NOTE — Discharge Instructions (Addendum)
Follow-up for recheck next week with your doctor.  Return if problems

## 2019-10-05 NOTE — ED Notes (Signed)
..  Patient verbalizes understanding of discharge instructions. Opportunity for questioning and answers were provided.  Discharge instructions and follow up information provided to [facility]. Understanding verbalized.

## 2019-10-05 NOTE — ED Notes (Signed)
Dr. Roderic Palau, Phil RN, Elmyra Ricks RN, [place], [facility] NT present at bedside. Patient placed on zoll prior to adenosine administration. Administration of adenosine explained to patient prior to medication administration. Pt verbalized understanding.

## 2019-10-07 ENCOUNTER — Encounter: Payer: Self-pay | Admitting: Adult Health

## 2019-10-07 ENCOUNTER — Non-Acute Institutional Stay (SKILLED_NURSING_FACILITY): Payer: Medicare HMO | Admitting: Adult Health

## 2019-10-07 DIAGNOSIS — F339 Major depressive disorder, recurrent, unspecified: Secondary | ICD-10-CM | POA: Diagnosis not present

## 2019-10-07 DIAGNOSIS — I471 Supraventricular tachycardia: Secondary | ICD-10-CM

## 2019-10-07 DIAGNOSIS — M1A9XX0 Chronic gout, unspecified, without tophus (tophi): Secondary | ICD-10-CM | POA: Diagnosis not present
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
## 2019-10-09 DIAGNOSIS — F329 Major depressive disorder, single episode, unspecified: Secondary | ICD-10-CM | POA: Diagnosis not present

## 2019-10-15 ENCOUNTER — Non-Acute Institutional Stay (SKILLED_NURSING_FACILITY): Payer: Medicare HMO | Admitting: Adult Health

## 2019-10-15 ENCOUNTER — Encounter: Payer: Self-pay | Admitting: Adult Health

## 2019-10-15 DIAGNOSIS — M1A9XX0 Chronic gout, unspecified, without tophus (tophi): Secondary | ICD-10-CM

## 2019-10-15 DIAGNOSIS — I471 Supraventricular tachycardia: Secondary | ICD-10-CM | POA: Diagnosis not present

## 2019-10-15 DIAGNOSIS — S069X0S Unspecified intracranial injury without loss of consciousness, sequela: Secondary | ICD-10-CM | POA: Diagnosis not present

## 2019-10-15 DIAGNOSIS — F068 Other specified mental disorders due to known physiological condition: Secondary | ICD-10-CM | POA: Diagnosis not present

## 2019-10-15 DIAGNOSIS — F339 Major depressive disorder, recurrent, unspecified: Secondary | ICD-10-CM
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
## 2019-10-23 ENCOUNTER — Encounter: Payer: Self-pay | Admitting: Adult Health

## 2019-10-23 ENCOUNTER — Non-Acute Institutional Stay (SKILLED_NURSING_FACILITY): Payer: Medicare HMO | Admitting: Adult Health

## 2019-10-23 DIAGNOSIS — F339 Major depressive disorder, recurrent, unspecified: Secondary | ICD-10-CM

## 2019-10-23 DIAGNOSIS — M1A9XX0 Chronic gout, unspecified, without tophus (tophi): Secondary | ICD-10-CM

## 2019-10-23 DIAGNOSIS — Z87820 Personal history of traumatic brain injury: Secondary | ICD-10-CM

## 2019-10-23 DIAGNOSIS — I471 Supraventricular tachycardia: Secondary | ICD-10-CM | POA: Diagnosis not present

## 2019-10-23 DIAGNOSIS — Z7189 Other specified counseling: Secondary | ICD-10-CM | POA: Diagnosis not present
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
## 2019-11-06 DIAGNOSIS — M79675 Pain in left toe(s): Secondary | ICD-10-CM | POA: Diagnosis not present

## 2019-11-06 DIAGNOSIS — I739 Peripheral vascular disease, unspecified: Secondary | ICD-10-CM | POA: Diagnosis not present

## 2019-11-06 DIAGNOSIS — M79674 Pain in right toe(s): Secondary | ICD-10-CM | POA: Diagnosis not present

## 2019-11-06 DIAGNOSIS — B351 Tinea unguium: Secondary | ICD-10-CM | POA: Diagnosis not present

## 2019-11-14 DIAGNOSIS — H5213 Myopia, bilateral: Secondary | ICD-10-CM | POA: Diagnosis not present

## 2019-11-14 DIAGNOSIS — H02839 Dermatochalasis of unspecified eye, unspecified eyelid: Secondary | ICD-10-CM | POA: Diagnosis not present

## 2019-11-14 DIAGNOSIS — H2513 Age-related nuclear cataract, bilateral: Secondary | ICD-10-CM | POA: Diagnosis not present

## 2019-11-14 DIAGNOSIS — H04123 Dry eye syndrome of bilateral lacrimal glands: Secondary | ICD-10-CM | POA: Diagnosis not present

## 2019-11-19 ENCOUNTER — Non-Acute Institutional Stay (SKILLED_NURSING_FACILITY): Payer: Medicare HMO | Admitting: Internal Medicine

## 2019-11-19 ENCOUNTER — Encounter: Payer: Self-pay | Admitting: Internal Medicine

## 2019-11-19 DIAGNOSIS — L719 Rosacea, unspecified: Secondary | ICD-10-CM

## 2019-11-19 DIAGNOSIS — C8588 Other specified types of non-Hodgkin lymphoma, lymph nodes of multiple sites: Secondary | ICD-10-CM

## 2019-11-19 DIAGNOSIS — F068 Other specified mental disorders due to known physiological condition: Secondary | ICD-10-CM | POA: Diagnosis not present

## 2019-11-19 DIAGNOSIS — D7589 Other specified diseases of blood and blood-forming organs: Secondary | ICD-10-CM

## 2019-11-19 DIAGNOSIS — S069X0S Unspecified intracranial injury without loss of consciousness, sequela: Secondary | ICD-10-CM | POA: Diagnosis not present

## 2019-11-19 NOTE — Patient Instructions (Signed)
See assessment and plan under each diagnosis in the problem list and acutely for this visit 

## 2019-11-19 NOTE — Assessment & Plan Note (Signed)
Oncology is no longer following the patient because of patient's cognitive issues as well as logistical concerns.  B12 and folate will be checked because of persistent macrocytosis.

## 2019-11-19 NOTE — Assessment & Plan Note (Signed)
Presently quiescent.

## 2019-11-19 NOTE — Assessment & Plan Note (Signed)
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  No behavioral issues reported by staff.

## 2019-11-19 NOTE — Progress Notes (Signed)
NURSING HOME LOCATION:  [facility] ROOM NUMBER: 105/A   CODE STATUS: DNR    PCP:  Hendricks Limes MD.   This is a nursing facility follow up of chronic medical diagnoses  Interim medical record and care since last [place] visit was updated with review of diagnostic studies and change in clinical status since last visit were documented.  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 Labs were completed 3/13.  Glucose was high normal at 100, calcium 8.2, and total protein 5.1.  Although he had no anemia he did exhibit macrocytosis with a value of 105.7.  There is no folate level or B12 level in found in Epic.  Review of systems: Dementia invalidated responses.  Responses to extensive ROS were all negative. When I first saw him he was in the TV room and was resistant to return to his room with me.  He started down the wrong corridor and had to be pointed in the right direction by a staff member.  His conversations seem to be directed to himself and laced with profanity.  He repeatedly says "shut up and go [place]".  He apologized for being "full of bull hockey".  Constitutional: No fever, significant weight change, fatigue  Eyes: No redness, discharge, pain, vision change ENT/mouth: No nasal congestion,  purulent discharge, earache, change in hearing, sore throat  Cardiovascular: No chest pain, palpitations, paroxysmal nocturnal dyspnea, claudication, edema  Respiratory: No cough, sputum production, hemoptysis, DOE, significant snoring, apnea   Gastrointestinal: No heartburn, dysphagia, abdominal pain, nausea /vomiting, rectal bleeding, melena, change in bowels Genitourinary: No dysuria, hematuria, pyuria, incontinence, nocturia Musculoskeletal: No joint stiffness, joint swelling, weakness, pain Dermatologic: No  rash, pruritus, change in appearance of skin Neurologic: No dizziness, headache, syncope, seizures, numbness, tingling Psychiatric: No significant anxiety, depression, insomnia, anorexia Endocrine: No change in hair/skin/nails, excessive thirst, excessive hunger, excessive urination  Hematologic/lymphatic: No significant bruising, lymphadenopathy, abnormal bleeding Allergy/immunology: No itchy/watery eyes, significant sneezing, urticaria, angioedema  Physical exam:  Pertinent or positive findings: He has somewhat of a moon facies.  Hair is thin and disheveled with pattern baldness over the crown.  Pupils are pinpoint.  Tracheostomy scar is well-healed.  Breath sounds are decreased.  Abdomen is protuberant.  Pedal pulses are decreased.  His gait is broad-based and slow.  He has flexion contracture of the right fifth finger with associated Dupuytren's contracture of the palm.  Antiwandering bracelet present on the right ankle.  General appearance: Adequately nourished; no acute distress, increased work of breathing is present.   Lymphatic: No lymphadenopathy about the head, neck, axilla. Eyes: No conjunctival inflammation or lid edema is present. There is no scleral icterus. Ears:  External ear exam shows no significant lesions or deformities.   Nose:  External nasal examination shows no deformity or inflammation. Nasal mucosa are pink and moist without lesions, exudates Oral exam:  Lips and gums are healthy appearing. There is no oropharyngeal erythema or exudate. Neck:  No thyromegaly, masses, tenderness noted.    Heart:  Normal rate and regular rhythm. S1 and S2 normal without gallop, murmur, click, rub .  Lungs:  without wheezes, rhonchi, rales, rubs. Abdomen: Bowel sounds are normal. Abdomen is soft and nontender with no organomegaly, hernias, masses. GU: Deferred  Extremities:  No cyanosis, clubbing, edema  Neurologic exam : Strength equal  in upper &  lower extremities Balance, [place]berg,  finger to nose testing could not be completed due to clinical state Skin: Warm & dry w/o tenting. No significant lesions or rash.  See summary under each active problem in the Problem List with associated updated therapeutic plan

## 2019-11-19 NOTE — Assessment & Plan Note (Addendum)
No reported weight loss.  On exam he has no lymphadenopathy or organomegaly.

## 2019-11-21 DIAGNOSIS — D519 Vitamin B12 deficiency anemia, unspecified: Secondary | ICD-10-CM | POA: Diagnosis not present

## 2019-11-21 DIAGNOSIS — C8518 Unspecified B-cell lymphoma, lymph nodes of multiple sites: Secondary | ICD-10-CM | POA: Diagnosis not present

## 2019-11-21 DIAGNOSIS — D649 Anemia, unspecified: Secondary | ICD-10-CM | POA: Diagnosis not present

## 2019-11-21 LAB — VITAMIN B12: Vitamin B-12: 327

## 2019-12-10 ENCOUNTER — Non-Acute Institutional Stay (SKILLED_NURSING_FACILITY): Payer: Medicare HMO | Admitting: Adult Health

## 2019-12-10 ENCOUNTER — Encounter: Payer: Self-pay | Admitting: Adult Health

## 2019-12-10 DIAGNOSIS — F068 Other specified mental disorders due to known physiological condition: Secondary | ICD-10-CM | POA: Diagnosis not present

## 2019-12-10 DIAGNOSIS — Z8679 Personal history of other diseases of the circulatory system: Secondary | ICD-10-CM | POA: Diagnosis not present

## 2019-12-10 DIAGNOSIS — F339 Major depressive disorder, recurrent, unspecified: Secondary | ICD-10-CM | POA: Diagnosis not present

## 2019-12-10 DIAGNOSIS — S069X0S Unspecified intracranial injury without loss of consciousness, sequela: Secondary | ICD-10-CM

## 2019-12-10 DIAGNOSIS — M1A9XX0 Chronic gout, unspecified, without tophus (tophi): Secondary | ICD-10-CM | POA: Diagnosis not present
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
## 2019-12-25 DIAGNOSIS — F329 Major depressive disorder, single episode, unspecified: Secondary | ICD-10-CM | POA: Diagnosis not present

## 2019-12-31 ENCOUNTER — Encounter: Payer: Self-pay | Admitting: Adult Health
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
## 2019-12-31 NOTE — Progress Notes (Signed)
This encounter was created in error - please disregard.

## 2020-01-01 ENCOUNTER — Non-Acute Institutional Stay (SKILLED_NURSING_FACILITY): Payer: Medicare HMO | Admitting: Adult Health

## 2020-01-01 DIAGNOSIS — Z7189 Other specified counseling: Secondary | ICD-10-CM

## 2020-01-01 DIAGNOSIS — M1A9XX0 Chronic gout, unspecified, without tophus (tophi): Secondary | ICD-10-CM | POA: Diagnosis not present

## 2020-01-01 DIAGNOSIS — F339 Major depressive disorder, recurrent, unspecified: Secondary | ICD-10-CM

## 2020-01-01 DIAGNOSIS — Z8679 Personal history of other diseases of the circulatory system: Secondary | ICD-10-CM

## 2020-01-03 ENCOUNTER — Non-Acute Institutional Stay (SKILLED_NURSING_FACILITY): Payer: Medicare HMO | Admitting: Adult Health

## 2020-01-03 ENCOUNTER — Encounter: Payer: Self-pay | Admitting: Adult Health

## 2020-01-03 DIAGNOSIS — N182 Chronic kidney disease, stage 2 (mild): Secondary | ICD-10-CM | POA: Diagnosis not present

## 2020-01-03 DIAGNOSIS — M1A9XX0 Chronic gout, unspecified, without tophus (tophi): Secondary | ICD-10-CM | POA: Diagnosis not present

## 2020-01-03 DIAGNOSIS — F068 Other specified mental disorders due to known physiological condition: Secondary | ICD-10-CM | POA: Diagnosis not present

## 2020-01-03 DIAGNOSIS — F339 Major depressive disorder, recurrent, unspecified: Secondary | ICD-10-CM | POA: Diagnosis not present

## 2020-01-03 DIAGNOSIS — S069X0S Unspecified intracranial injury without loss of consciousness, sequela: Secondary | ICD-10-CM | POA: Diagnosis not present

## 2020-01-03 DIAGNOSIS — Z8679 Personal history of other diseases of the circulatory system: Secondary | ICD-10-CM | POA: Diagnosis not present

## 2020-01-03 DIAGNOSIS — R4189 Other symptoms and signs involving cognitive functions and awareness: Secondary | ICD-10-CM
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
## 2020-01-08 DIAGNOSIS — R4189 Other symptoms and signs involving cognitive functions and awareness: Secondary | ICD-10-CM | POA: Diagnosis not present

## 2020-01-08 DIAGNOSIS — F329 Major depressive disorder, single episode, unspecified: Secondary | ICD-10-CM | POA: Diagnosis not present

## 2020-01-22 DIAGNOSIS — R4189 Other symptoms and signs involving cognitive functions and awareness: Secondary | ICD-10-CM | POA: Diagnosis not present

## 2020-01-22 DIAGNOSIS — F329 Major depressive disorder, single episode, unspecified: Secondary | ICD-10-CM | POA: Diagnosis not present

## 2020-01-31 ENCOUNTER — Non-Acute Institutional Stay (SKILLED_NURSING_FACILITY): Payer: Medicare HMO | Admitting: Adult Health

## 2020-01-31 ENCOUNTER — Encounter: Payer: Self-pay | Admitting: Adult Health

## 2020-01-31 DIAGNOSIS — F339 Major depressive disorder, recurrent, unspecified: Secondary | ICD-10-CM | POA: Diagnosis not present

## 2020-01-31 DIAGNOSIS — Z1211 Encounter for screening for malignant neoplasm of colon: Secondary | ICD-10-CM

## 2020-01-31 DIAGNOSIS — Z8679 Personal history of other diseases of the circulatory system: Secondary | ICD-10-CM

## 2020-01-31 DIAGNOSIS — S069X0S Unspecified intracranial injury without loss of consciousness, sequela: Secondary | ICD-10-CM | POA: Diagnosis not present

## 2020-01-31 DIAGNOSIS — F068 Other specified mental disorders due to known physiological condition: Secondary | ICD-10-CM

## 2020-01-31 DIAGNOSIS — R4189 Other symptoms and signs involving cognitive functions and awareness: Secondary | ICD-10-CM

## 2020-01-31 DIAGNOSIS — M1A9XX0 Chronic gout, unspecified, without tophus (tophi): Secondary | ICD-10-CM | POA: Diagnosis not present
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
## 2020-02-03 DIAGNOSIS — R7989 Other specified abnormal findings of blood chemistry: Secondary | ICD-10-CM | POA: Diagnosis not present

## 2020-02-03 DIAGNOSIS — E785 Hyperlipidemia, unspecified: Secondary | ICD-10-CM | POA: Diagnosis not present

## 2020-02-11 ENCOUNTER — Non-Acute Institutional Stay (SKILLED_NURSING_FACILITY): Payer: Medicare HMO | Admitting: Internal Medicine

## 2020-02-11 ENCOUNTER — Encounter: Payer: Self-pay | Admitting: Internal Medicine

## 2020-02-11 DIAGNOSIS — K4090 Unilateral inguinal hernia, without obstruction or gangrene, not specified as recurrent: Secondary | ICD-10-CM | POA: Insufficient documentation

## 2020-02-11 DIAGNOSIS — S069X9D Unspecified intracranial injury with loss of consciousness of unspecified duration, subsequent encounter: Secondary | ICD-10-CM

## 2020-02-11 NOTE — Assessment & Plan Note (Addendum)
Monitor for symptoms suggesting incarceration such as persistent localized abdominal pain,  absent bowel sounds or ileus; absence of bowel movements or nausea and vomiting.

## 2020-02-11 NOTE — Progress Notes (Signed)
   NURSING HOME LOCATION:  [facility] ROOM NUMBER:  105-A  CODE STATUS:  DNR  PCP:  Hendricks Limes, MD  [address] [place] [address]  This is a nursing facility follow up for specific acute issue of "edema of the genitals".  Interim medical record and care since last [facility] visit was updated with review of diagnostic studies and change in clinical status since last visit were documented.  HPI: CNA noted swelling in the inguinal/genital area during skin observations and cleansing.  Evaluation was requested.  No GU or GI symptoms have been reported. The patient has traumatic brain injury and can provide no history.  Review of systems:  Gastrointestinal: No heartburn, dysphagia, abdominal pain, nausea /vomiting, rectal bleeding, melena, change in bowels Genitourinary: No dysuria, hematuria, pyuria, incontinence, nocturia Dermatologic: No rash, pruritus, change in appearance of skin Hematologic/lymphatic: No significant bruising, lymphadenopathy, abnormal bleeding  Physical exam:  Pertinent or positive findings: He reluctantly went back to his room for evaluation, unable to comprehend why I wanted to interview and examine him.  He did exhibit a rocking broad-based gait.  Abdomen is nontender.  Penis is normal; there is slight testicular atrophy.  He has no inguinal lymphadenopathy.  There is a large right direct inguinal hernia which is reducible.  General appearance: Adequately nourished; no acute distress, increased work of breathing is present.   Abdomen:  Abdomen is soft and nontender with no organomegaly, masses. Skin: Warm & dry w/o tenting. No significant lesions or rash.  See summary under each active problem in the Problem List with associated updated therapeutic plan

## 2020-02-11 NOTE — Assessment & Plan Note (Signed)
No behavioral issues reported.  Unfortunately he is unable to provide any meaningful history.

## 2020-02-11 NOTE — Patient Instructions (Signed)
See assessment and plan under each diagnosis in the problem list and acutely for this visit 

## 2020-02-13 DIAGNOSIS — C8518 Unspecified B-cell lymphoma, lymph nodes of multiple sites: Secondary | ICD-10-CM | POA: Diagnosis not present

## 2020-02-13 DIAGNOSIS — D649 Anemia, unspecified: Secondary | ICD-10-CM | POA: Diagnosis not present

## 2020-02-13 LAB — CBC AND DIFFERENTIAL
HCT: 43 (ref 41–53)
HCT: 43 (ref 41–53)
Hemoglobin: 14.5 (ref 13.5–17.5)
Hemoglobin: 14.5 (ref 13.5–17.5)
Neutrophils Absolute: 4
Neutrophils Absolute: 4
Platelets: 214 (ref 150–399)
Platelets: 214 (ref 150–399)
WBC: 5.5
WBC: 5.5

## 2020-02-13 LAB — CBC
RBC: 4.12 (ref 3.87–5.11)
RBC: 4.12 (ref 3.87–5.11)

## 2020-02-18 ENCOUNTER — Encounter: Payer: Self-pay | Admitting: Adult Health

## 2020-02-18 NOTE — Progress Notes (Signed)
This encounter was created in error - please disregard.

## 2020-02-25 ENCOUNTER — Non-Acute Institutional Stay (SKILLED_NURSING_FACILITY): Payer: Medicare HMO | Admitting: Adult Health

## 2020-02-25 ENCOUNTER — Encounter: Payer: Self-pay | Admitting: Adult Health

## 2020-02-25 DIAGNOSIS — K4090 Unilateral inguinal hernia, without obstruction or gangrene, not specified as recurrent: Secondary | ICD-10-CM

## 2020-02-25 DIAGNOSIS — F339 Major depressive disorder, recurrent, unspecified: Secondary | ICD-10-CM | POA: Diagnosis not present

## 2020-02-25 DIAGNOSIS — M1A9XX0 Chronic gout, unspecified, without tophus (tophi): Secondary | ICD-10-CM

## 2020-02-25 DIAGNOSIS — S069X9D Unspecified intracranial injury with loss of consciousness of unspecified duration, subsequent encounter: Secondary | ICD-10-CM
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
## 2020-03-04 DIAGNOSIS — R4189 Other symptoms and signs involving cognitive functions and awareness: Secondary | ICD-10-CM | POA: Diagnosis not present

## 2020-03-04 DIAGNOSIS — F338 Other recurrent depressive disorders: Secondary | ICD-10-CM | POA: Diagnosis not present

## 2020-03-11 DIAGNOSIS — F338 Other recurrent depressive disorders: Secondary | ICD-10-CM | POA: Diagnosis not present

## 2020-03-11 DIAGNOSIS — R4189 Other symptoms and signs involving cognitive functions and awareness: Secondary | ICD-10-CM | POA: Diagnosis not present

## 2020-03-18 DIAGNOSIS — R4189 Other symptoms and signs involving cognitive functions and awareness: Secondary | ICD-10-CM | POA: Diagnosis not present

## 2020-03-18 DIAGNOSIS — F338 Other recurrent depressive disorders: Secondary | ICD-10-CM | POA: Diagnosis not present

## 2020-03-25 ENCOUNTER — Encounter: Payer: Self-pay | Admitting: Adult Health

## 2020-03-25 ENCOUNTER — Non-Acute Institutional Stay (SKILLED_NURSING_FACILITY): Payer: Medicare HMO | Admitting: Adult Health

## 2020-03-25 DIAGNOSIS — S069X0S Unspecified intracranial injury without loss of consciousness, sequela: Secondary | ICD-10-CM | POA: Diagnosis not present

## 2020-03-25 DIAGNOSIS — R4189 Other symptoms and signs involving cognitive functions and awareness: Secondary | ICD-10-CM

## 2020-03-25 DIAGNOSIS — M1A9XX0 Chronic gout, unspecified, without tophus (tophi): Secondary | ICD-10-CM | POA: Diagnosis not present

## 2020-03-25 DIAGNOSIS — F339 Major depressive disorder, recurrent, unspecified: Secondary | ICD-10-CM | POA: Diagnosis not present

## 2020-03-25 DIAGNOSIS — F068 Other specified mental disorders due to known physiological condition: Secondary | ICD-10-CM | POA: Diagnosis not present
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
## 2020-04-01 DIAGNOSIS — M6281 Muscle weakness (generalized): Secondary | ICD-10-CM | POA: Diagnosis not present

## 2020-04-01 DIAGNOSIS — Z87820 Personal history of traumatic brain injury: Secondary | ICD-10-CM | POA: Diagnosis not present

## 2020-04-02 DIAGNOSIS — Z87820 Personal history of traumatic brain injury: Secondary | ICD-10-CM | POA: Diagnosis not present

## 2020-04-02 DIAGNOSIS — M6281 Muscle weakness (generalized): Secondary | ICD-10-CM | POA: Diagnosis not present

## 2020-04-03 DIAGNOSIS — M6281 Muscle weakness (generalized): Secondary | ICD-10-CM | POA: Diagnosis not present

## 2020-04-03 DIAGNOSIS — Z87820 Personal history of traumatic brain injury: Secondary | ICD-10-CM | POA: Diagnosis not present

## 2020-04-06 DIAGNOSIS — M6281 Muscle weakness (generalized): Secondary | ICD-10-CM | POA: Diagnosis not present

## 2020-04-06 DIAGNOSIS — Z87820 Personal history of traumatic brain injury: Secondary | ICD-10-CM | POA: Diagnosis not present

## 2020-04-07 DIAGNOSIS — M6281 Muscle weakness (generalized): Secondary | ICD-10-CM | POA: Diagnosis not present

## 2020-04-07 DIAGNOSIS — Z87820 Personal history of traumatic brain injury: Secondary | ICD-10-CM | POA: Diagnosis not present

## 2020-04-08 DIAGNOSIS — Z87820 Personal history of traumatic brain injury: Secondary | ICD-10-CM | POA: Diagnosis not present

## 2020-04-08 DIAGNOSIS — M6281 Muscle weakness (generalized): Secondary | ICD-10-CM | POA: Diagnosis not present

## 2020-04-09 DIAGNOSIS — M6281 Muscle weakness (generalized): Secondary | ICD-10-CM | POA: Diagnosis not present

## 2020-04-09 DIAGNOSIS — Z87820 Personal history of traumatic brain injury: Secondary | ICD-10-CM | POA: Diagnosis not present

## 2020-04-10 DIAGNOSIS — Z87820 Personal history of traumatic brain injury: Secondary | ICD-10-CM | POA: Diagnosis not present

## 2020-04-10 DIAGNOSIS — M6281 Muscle weakness (generalized): Secondary | ICD-10-CM | POA: Diagnosis not present

## 2020-04-13 DIAGNOSIS — M6281 Muscle weakness (generalized): Secondary | ICD-10-CM | POA: Diagnosis not present

## 2020-04-13 DIAGNOSIS — Z87820 Personal history of traumatic brain injury: Secondary | ICD-10-CM | POA: Diagnosis not present

## 2020-04-14 DIAGNOSIS — M6281 Muscle weakness (generalized): Secondary | ICD-10-CM | POA: Diagnosis not present

## 2020-04-14 DIAGNOSIS — Z87820 Personal history of traumatic brain injury: Secondary | ICD-10-CM | POA: Diagnosis not present

## 2020-04-15 DIAGNOSIS — M6281 Muscle weakness (generalized): Secondary | ICD-10-CM | POA: Diagnosis not present

## 2020-04-15 DIAGNOSIS — Z87820 Personal history of traumatic brain injury: Secondary | ICD-10-CM | POA: Diagnosis not present

## 2020-04-16 DIAGNOSIS — M6281 Muscle weakness (generalized): Secondary | ICD-10-CM | POA: Diagnosis not present

## 2020-04-16 DIAGNOSIS — Z87820 Personal history of traumatic brain injury: Secondary | ICD-10-CM | POA: Diagnosis not present

## 2020-04-17 DIAGNOSIS — M6281 Muscle weakness (generalized): Secondary | ICD-10-CM | POA: Diagnosis not present

## 2020-04-17 DIAGNOSIS — Z87820 Personal history of traumatic brain injury: Secondary | ICD-10-CM | POA: Diagnosis not present

## 2020-04-20 DIAGNOSIS — Z87820 Personal history of traumatic brain injury: Secondary | ICD-10-CM | POA: Diagnosis not present

## 2020-04-20 DIAGNOSIS — M6281 Muscle weakness (generalized): Secondary | ICD-10-CM | POA: Diagnosis not present

## 2020-04-21 DIAGNOSIS — I739 Peripheral vascular disease, unspecified: Secondary | ICD-10-CM | POA: Diagnosis not present

## 2020-04-21 DIAGNOSIS — L602 Onychogryphosis: Secondary | ICD-10-CM | POA: Diagnosis not present

## 2020-04-21 DIAGNOSIS — M6281 Muscle weakness (generalized): Secondary | ICD-10-CM | POA: Diagnosis not present

## 2020-04-21 DIAGNOSIS — Z87820 Personal history of traumatic brain injury: Secondary | ICD-10-CM | POA: Diagnosis not present

## 2020-04-22 DIAGNOSIS — R4189 Other symptoms and signs involving cognitive functions and awareness: Secondary | ICD-10-CM | POA: Diagnosis not present

## 2020-04-22 DIAGNOSIS — F338 Other recurrent depressive disorders: Secondary | ICD-10-CM | POA: Diagnosis not present

## 2020-04-22 DIAGNOSIS — Z87820 Personal history of traumatic brain injury: Secondary | ICD-10-CM | POA: Diagnosis not present

## 2020-04-22 DIAGNOSIS — M6281 Muscle weakness (generalized): Secondary | ICD-10-CM | POA: Diagnosis not present

## 2020-04-23 DIAGNOSIS — M6281 Muscle weakness (generalized): Secondary | ICD-10-CM | POA: Diagnosis not present

## 2020-04-23 DIAGNOSIS — Z87820 Personal history of traumatic brain injury: Secondary | ICD-10-CM | POA: Diagnosis not present

## 2020-04-27 ENCOUNTER — Encounter: Payer: Self-pay | Admitting: Adult Health

## 2020-04-27 ENCOUNTER — Non-Acute Institutional Stay (SKILLED_NURSING_FACILITY): Payer: Medicare HMO | Admitting: Adult Health

## 2020-04-27 DIAGNOSIS — M1A9XX0 Chronic gout, unspecified, without tophus (tophi): Secondary | ICD-10-CM | POA: Diagnosis not present

## 2020-04-27 DIAGNOSIS — S069X0S Unspecified intracranial injury without loss of consciousness, sequela: Secondary | ICD-10-CM

## 2020-04-27 DIAGNOSIS — L853 Xerosis cutis: Secondary | ICD-10-CM | POA: Diagnosis not present

## 2020-04-27 DIAGNOSIS — Z8679 Personal history of other diseases of the circulatory system: Secondary | ICD-10-CM | POA: Diagnosis not present

## 2020-04-27 DIAGNOSIS — F339 Major depressive disorder, recurrent, unspecified: Secondary | ICD-10-CM | POA: Diagnosis not present

## 2020-04-27 DIAGNOSIS — F068 Other specified mental disorders due to known physiological condition: Secondary | ICD-10-CM | POA: Diagnosis not present
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
## 2020-05-13 DIAGNOSIS — F338 Other recurrent depressive disorders: Secondary | ICD-10-CM | POA: Diagnosis not present

## 2020-05-13 DIAGNOSIS — R4189 Other symptoms and signs involving cognitive functions and awareness: Secondary | ICD-10-CM | POA: Diagnosis not present

## 2020-05-29 ENCOUNTER — Encounter: Payer: Self-pay | Admitting: Adult Health

## 2020-05-29 ENCOUNTER — Non-Acute Institutional Stay (SKILLED_NURSING_FACILITY): Payer: Medicare HMO | Admitting: Adult Health

## 2020-05-29 DIAGNOSIS — F339 Major depressive disorder, recurrent, unspecified: Secondary | ICD-10-CM | POA: Diagnosis not present

## 2020-05-29 DIAGNOSIS — M1A9XX0 Chronic gout, unspecified, without tophus (tophi): Secondary | ICD-10-CM | POA: Diagnosis not present

## 2020-05-29 DIAGNOSIS — S069X9D Unspecified intracranial injury with loss of consciousness of unspecified duration, subsequent encounter: Secondary | ICD-10-CM
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
## 2020-06-24 DIAGNOSIS — F338 Other recurrent depressive disorders: Secondary | ICD-10-CM | POA: Diagnosis not present

## 2020-06-24 DIAGNOSIS — R4189 Other symptoms and signs involving cognitive functions and awareness: Secondary | ICD-10-CM | POA: Diagnosis not present

## 2020-07-07 DIAGNOSIS — I739 Peripheral vascular disease, unspecified: Secondary | ICD-10-CM | POA: Diagnosis not present

## 2020-07-07 DIAGNOSIS — L602 Onychogryphosis: Secondary | ICD-10-CM | POA: Diagnosis not present

## 2020-07-08 DIAGNOSIS — F338 Other recurrent depressive disorders: Secondary | ICD-10-CM | POA: Diagnosis not present

## 2020-07-08 DIAGNOSIS — R4189 Other symptoms and signs involving cognitive functions and awareness: Secondary | ICD-10-CM | POA: Diagnosis not present

## 2020-07-09 ENCOUNTER — Encounter: Payer: Self-pay | Admitting: Adult Health

## 2020-07-09 ENCOUNTER — Non-Acute Institutional Stay (SKILLED_NURSING_FACILITY): Payer: Medicare HMO | Admitting: Adult Health

## 2020-07-09 DIAGNOSIS — M1A9XX0 Chronic gout, unspecified, without tophus (tophi): Secondary | ICD-10-CM

## 2020-07-09 DIAGNOSIS — F068 Other specified mental disorders due to known physiological condition: Secondary | ICD-10-CM | POA: Diagnosis not present

## 2020-07-09 DIAGNOSIS — F339 Major depressive disorder, recurrent, unspecified: Secondary | ICD-10-CM

## 2020-07-09 DIAGNOSIS — S069X0S Unspecified intracranial injury without loss of consciousness, sequela: Secondary | ICD-10-CM | POA: Diagnosis not present
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
## 2020-07-31 ENCOUNTER — Non-Acute Institutional Stay (SKILLED_NURSING_FACILITY): Payer: Medicare HMO | Admitting: Adult Health

## 2020-07-31 ENCOUNTER — Encounter: Payer: Self-pay | Admitting: Adult Health

## 2020-07-31 DIAGNOSIS — F339 Major depressive disorder, recurrent, unspecified: Secondary | ICD-10-CM

## 2020-07-31 DIAGNOSIS — M1A9XX0 Chronic gout, unspecified, without tophus (tophi): Secondary | ICD-10-CM

## 2020-07-31 DIAGNOSIS — L853 Xerosis cutis: Secondary | ICD-10-CM

## 2020-07-31 DIAGNOSIS — S069X9D Unspecified intracranial injury with loss of consciousness of unspecified duration, subsequent encounter: Secondary | ICD-10-CM | POA: Diagnosis not present
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
## 2020-08-31 ENCOUNTER — Non-Acute Institutional Stay (SKILLED_NURSING_FACILITY): Payer: Medicare HMO | Admitting: Adult Health

## 2020-08-31 ENCOUNTER — Encounter: Payer: Self-pay | Admitting: Adult Health

## 2020-08-31 DIAGNOSIS — F339 Major depressive disorder, recurrent, unspecified: Secondary | ICD-10-CM

## 2020-08-31 DIAGNOSIS — S069X9D Unspecified intracranial injury with loss of consciousness of unspecified duration, subsequent encounter: Secondary | ICD-10-CM | POA: Diagnosis not present

## 2020-08-31 DIAGNOSIS — M1A9XX0 Chronic gout, unspecified, without tophus (tophi): Secondary | ICD-10-CM | POA: Diagnosis not present
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
## 2020-09-02 DIAGNOSIS — F338 Other recurrent depressive disorders: Secondary | ICD-10-CM | POA: Diagnosis not present

## 2020-09-02 DIAGNOSIS — R4189 Other symptoms and signs involving cognitive functions and awareness: Secondary | ICD-10-CM | POA: Diagnosis not present

## 2020-09-29 ENCOUNTER — Encounter: Payer: Self-pay | Admitting: Internal Medicine

## 2020-09-29 ENCOUNTER — Non-Acute Institutional Stay (SKILLED_NURSING_FACILITY): Payer: Medicare HMO | Admitting: Internal Medicine

## 2020-09-29 DIAGNOSIS — I471 Supraventricular tachycardia: Secondary | ICD-10-CM | POA: Diagnosis not present

## 2020-09-29 DIAGNOSIS — Z87820 Personal history of traumatic brain injury: Secondary | ICD-10-CM | POA: Diagnosis not present

## 2020-09-29 DIAGNOSIS — F068 Other specified mental disorders due to known physiological condition: Secondary | ICD-10-CM | POA: Diagnosis not present

## 2020-09-29 DIAGNOSIS — D7589 Other specified diseases of blood and blood-forming organs: Secondary | ICD-10-CM

## 2020-09-29 DIAGNOSIS — S069X0S Unspecified intracranial injury without loss of consciousness, sequela: Secondary | ICD-10-CM | POA: Diagnosis not present

## 2020-09-29 DIAGNOSIS — C8588 Other specified types of non-Hodgkin lymphoma, lymph nodes of multiple sites: Secondary | ICD-10-CM

## 2020-09-29 DIAGNOSIS — R1311 Dysphagia, oral phase: Secondary | ICD-10-CM | POA: Diagnosis not present

## 2020-09-29 NOTE — Assessment & Plan Note (Signed)
The macrocytosis improved slightly from 105.7-103.8; the anemia has not recurred.

## 2020-09-29 NOTE — Patient Instructions (Signed)
See assessment and plan under each diagnosis in the problem list and acutely for this visit 

## 2020-09-29 NOTE — Assessment & Plan Note (Addendum)
On exam there is no evidence of lymphadenopathy or organomegaly.  Unless there are abnormalities on his CBC and differential; he will not be referred back to Hematology/Oncology.
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
## 2020-09-29 NOTE — Assessment & Plan Note (Addendum)
Although he has exhibited some macrocytosis; B12 level has been low normal.  It is unlikely that his neurocognitive issues have any relationship to B12 deficiency and are solely a reflection of his TBI. Other than profanity laced speech, no behavioral issues described by staff.

## 2020-09-30 DIAGNOSIS — Z87820 Personal history of traumatic brain injury: Secondary | ICD-10-CM | POA: Diagnosis not present

## 2020-09-30 DIAGNOSIS — R1311 Dysphagia, oral phase: Secondary | ICD-10-CM | POA: Diagnosis not present

## 2020-10-01 DIAGNOSIS — Z87820 Personal history of traumatic brain injury: Secondary | ICD-10-CM | POA: Diagnosis not present

## 2020-10-01 DIAGNOSIS — R1311 Dysphagia, oral phase: Secondary | ICD-10-CM | POA: Diagnosis not present

## 2020-10-02 ENCOUNTER — Encounter: Payer: Self-pay | Admitting: Adult Health

## 2020-10-02 ENCOUNTER — Non-Acute Institutional Stay (SKILLED_NURSING_FACILITY): Payer: Medicare HMO | Admitting: Adult Health

## 2020-10-02 DIAGNOSIS — Z87820 Personal history of traumatic brain injury: Secondary | ICD-10-CM | POA: Diagnosis not present

## 2020-10-02 DIAGNOSIS — J029 Acute pharyngitis, unspecified: Secondary | ICD-10-CM | POA: Diagnosis not present

## 2020-10-02 DIAGNOSIS — R1311 Dysphagia, oral phase: Secondary | ICD-10-CM | POA: Diagnosis not present
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
## 2020-10-05 DIAGNOSIS — R1311 Dysphagia, oral phase: Secondary | ICD-10-CM | POA: Diagnosis not present

## 2020-10-05 DIAGNOSIS — Z87820 Personal history of traumatic brain injury: Secondary | ICD-10-CM | POA: Diagnosis not present

## 2020-10-06 DIAGNOSIS — R1311 Dysphagia, oral phase: Secondary | ICD-10-CM | POA: Diagnosis not present

## 2020-10-06 DIAGNOSIS — Z87820 Personal history of traumatic brain injury: Secondary | ICD-10-CM | POA: Diagnosis not present

## 2020-10-07 DIAGNOSIS — Z87820 Personal history of traumatic brain injury: Secondary | ICD-10-CM | POA: Diagnosis not present

## 2020-10-07 DIAGNOSIS — R4189 Other symptoms and signs involving cognitive functions and awareness: Secondary | ICD-10-CM | POA: Diagnosis not present

## 2020-10-07 DIAGNOSIS — R1311 Dysphagia, oral phase: Secondary | ICD-10-CM | POA: Diagnosis not present

## 2020-10-07 DIAGNOSIS — F338 Other recurrent depressive disorders: Secondary | ICD-10-CM | POA: Diagnosis not present

## 2020-10-08 DIAGNOSIS — Z87820 Personal history of traumatic brain injury: Secondary | ICD-10-CM | POA: Diagnosis not present

## 2020-10-08 DIAGNOSIS — R1311 Dysphagia, oral phase: Secondary | ICD-10-CM | POA: Diagnosis not present

## 2020-10-09 DIAGNOSIS — R1311 Dysphagia, oral phase: Secondary | ICD-10-CM | POA: Diagnosis not present

## 2020-10-09 DIAGNOSIS — Z87820 Personal history of traumatic brain injury: Secondary | ICD-10-CM | POA: Diagnosis not present

## 2020-10-14 DIAGNOSIS — F338 Other recurrent depressive disorders: Secondary | ICD-10-CM | POA: Diagnosis not present

## 2020-10-14 DIAGNOSIS — R4189 Other symptoms and signs involving cognitive functions and awareness: Secondary | ICD-10-CM | POA: Diagnosis not present

## 2020-10-19 DIAGNOSIS — I739 Peripheral vascular disease, unspecified: Secondary | ICD-10-CM | POA: Diagnosis not present

## 2020-10-19 DIAGNOSIS — L602 Onychogryphosis: Secondary | ICD-10-CM | POA: Diagnosis not present

## 2020-10-26 ENCOUNTER — Non-Acute Institutional Stay (SKILLED_NURSING_FACILITY): Payer: Medicare HMO | Admitting: Adult Health

## 2020-10-26 ENCOUNTER — Encounter: Payer: Self-pay | Admitting: Adult Health

## 2020-10-26 DIAGNOSIS — S069X0S Unspecified intracranial injury without loss of consciousness, sequela: Secondary | ICD-10-CM | POA: Diagnosis not present

## 2020-10-26 DIAGNOSIS — F339 Major depressive disorder, recurrent, unspecified: Secondary | ICD-10-CM

## 2020-10-26 DIAGNOSIS — F068 Other specified mental disorders due to known physiological condition: Secondary | ICD-10-CM

## 2020-10-26 DIAGNOSIS — M1A9XX0 Chronic gout, unspecified, without tophus (tophi): Secondary | ICD-10-CM | POA: Diagnosis not present

## 2020-10-26 DIAGNOSIS — R4189 Other symptoms and signs involving cognitive functions and awareness: Secondary | ICD-10-CM
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
## 2020-10-27 DIAGNOSIS — C8510 Unspecified B-cell lymphoma, unspecified site: Secondary | ICD-10-CM | POA: Diagnosis not present

## 2020-10-27 DIAGNOSIS — R946 Abnormal results of thyroid function studies: Secondary | ICD-10-CM | POA: Diagnosis not present

## 2020-10-27 LAB — BASIC METABOLIC PANEL
BUN: 18 (ref 4–21)
CO2: 26 — AB (ref 13–22)
Chloride: 101 (ref 99–108)
Creatinine: 0.9 (ref <=1.3)
Glucose: 115
Potassium: 4.2 (ref 3.4–5.3)
Sodium: 138 (ref 137–147)

## 2020-10-27 LAB — COMPREHENSIVE METABOLIC PANEL
Albumin: 3.9 (ref 3.5–5.0)
Calcium: 9.3 (ref 8.7–10.7)
GFR calc Af Amer: >90
GFR calc non Af Amer: 84.34
Globulin: 2.3

## 2020-10-27 LAB — CBC AND DIFFERENTIAL
HCT: 44 (ref 41–53)
Hemoglobin: 15 (ref 13.5–17.5)
Platelets: 196 (ref 150–399)
WBC: 5.5

## 2020-10-27 LAB — HEPATIC FUNCTION PANEL
ALT: 14 (ref 10–40)
AST: 17 (ref 14–40)
Alkaline Phosphatase: 95 (ref 25–125)
Bilirubin, Total: 0.3

## 2020-10-27 LAB — TSH: TSH: 4.03 (ref <=5.90)

## 2020-10-27 LAB — CBC: RBC: 4.19 (ref 3.87–5.11)

## 2020-11-25 DIAGNOSIS — F338 Other recurrent depressive disorders: Secondary | ICD-10-CM | POA: Diagnosis not present

## 2020-11-25 DIAGNOSIS — R4189 Other symptoms and signs involving cognitive functions and awareness: Secondary | ICD-10-CM | POA: Diagnosis not present

## 2020-12-07 ENCOUNTER — Encounter: Payer: Self-pay | Admitting: Adult Health

## 2020-12-07 ENCOUNTER — Non-Acute Institutional Stay (SKILLED_NURSING_FACILITY): Payer: Medicare HMO | Admitting: Adult Health

## 2020-12-07 DIAGNOSIS — Z91199 Patient's noncompliance with other medical treatment and regimen due to unspecified reason: Secondary | ICD-10-CM

## 2020-12-07 DIAGNOSIS — S069X0S Unspecified intracranial injury without loss of consciousness, sequela: Secondary | ICD-10-CM

## 2020-12-07 DIAGNOSIS — F068 Other specified mental disorders due to known physiological condition: Secondary | ICD-10-CM | POA: Diagnosis not present

## 2020-12-07 DIAGNOSIS — F339 Major depressive disorder, recurrent, unspecified: Secondary | ICD-10-CM | POA: Diagnosis not present

## 2020-12-07 DIAGNOSIS — Z9119 Patient's noncompliance with other medical treatment and regimen: Secondary | ICD-10-CM | POA: Diagnosis not present

## 2020-12-07 DIAGNOSIS — M1A9XX0 Chronic gout, unspecified, without tophus (tophi): Secondary | ICD-10-CM | POA: Diagnosis not present
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
## 2021-01-18 ENCOUNTER — Encounter: Payer: Self-pay | Admitting: Internal Medicine

## 2021-01-19 DIAGNOSIS — I739 Peripheral vascular disease, unspecified: Secondary | ICD-10-CM | POA: Diagnosis not present

## 2021-01-19 DIAGNOSIS — L602 Onychogryphosis: Secondary | ICD-10-CM | POA: Diagnosis not present

## 2021-01-20 DIAGNOSIS — R4189 Other symptoms and signs involving cognitive functions and awareness: Secondary | ICD-10-CM | POA: Diagnosis not present

## 2021-01-20 DIAGNOSIS — F338 Other recurrent depressive disorders: Secondary | ICD-10-CM | POA: Diagnosis not present

## 2021-02-05 DIAGNOSIS — Z87820 Personal history of traumatic brain injury: Secondary | ICD-10-CM | POA: Diagnosis not present

## 2021-02-05 DIAGNOSIS — R2681 Unsteadiness on feet: Secondary | ICD-10-CM | POA: Diagnosis not present

## 2021-02-05 DIAGNOSIS — M6281 Muscle weakness (generalized): Secondary | ICD-10-CM | POA: Diagnosis not present

## 2021-02-12 DIAGNOSIS — H2513 Age-related nuclear cataract, bilateral: Secondary | ICD-10-CM | POA: Diagnosis not present

## 2021-02-12 DIAGNOSIS — H04123 Dry eye syndrome of bilateral lacrimal glands: Secondary | ICD-10-CM | POA: Diagnosis not present

## 2021-02-12 DIAGNOSIS — H35033 Hypertensive retinopathy, bilateral: Secondary | ICD-10-CM | POA: Diagnosis not present

## 2021-02-12 DIAGNOSIS — H02839 Dermatochalasis of unspecified eye, unspecified eyelid: Secondary | ICD-10-CM | POA: Diagnosis not present

## 2021-02-18 ENCOUNTER — Encounter: Payer: Self-pay | Admitting: Internal Medicine

## 2021-02-18 ENCOUNTER — Non-Acute Institutional Stay (SKILLED_NURSING_FACILITY): Payer: Medicare HMO | Admitting: Internal Medicine

## 2021-02-18 DIAGNOSIS — Z87820 Personal history of traumatic brain injury: Secondary | ICD-10-CM

## 2021-02-18 DIAGNOSIS — C8588 Other specified types of non-Hodgkin lymphoma, lymph nodes of multiple sites: Secondary | ICD-10-CM

## 2021-02-18 DIAGNOSIS — S069X0S Unspecified intracranial injury without loss of consciousness, sequela: Secondary | ICD-10-CM | POA: Diagnosis not present

## 2021-02-18 DIAGNOSIS — D7589 Other specified diseases of blood and blood-forming organs: Secondary | ICD-10-CM

## 2021-02-18 DIAGNOSIS — F068 Other specified mental disorders due to known physiological condition: Secondary | ICD-10-CM

## 2021-02-18 DIAGNOSIS — F3340 Major depressive disorder, recurrent, in remission, unspecified: Secondary | ICD-10-CM | POA: Diagnosis not present

## 2021-02-18 DIAGNOSIS — Z1211 Encounter for screening for malignant neoplasm of colon: Secondary | ICD-10-CM | POA: Diagnosis not present

## 2021-02-18 NOTE — Assessment & Plan Note (Addendum)
Behavioral issues include intermittent noncompliance with hygiene measures as well as refusal to complete FOB testing or receive pneumonia or COVID vaccines

## 2021-02-18 NOTE — Assessment & Plan Note (Signed)
He is on Escitalopram 5 mg qd

## 2021-02-18 NOTE — Assessment & Plan Note (Signed)
Most recent CBC and differential was normal except for the macrocytosis.  No anemia is present.

## 2021-02-18 NOTE — Assessment & Plan Note (Addendum)
He is asymptomatic & does not comprehend he has had lymphoma. Current physical &  labs do not suggest recurrence
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
## 2021-02-18 NOTE — Assessment & Plan Note (Signed)
He has refused FOBT testing of the stool.

## 2021-02-18 NOTE — Patient Instructions (Signed)
See assessment and plan under each diagnosis in the problem list and acutely for this visit 

## 2021-03-15 ENCOUNTER — Non-Acute Institutional Stay (SKILLED_NURSING_FACILITY): Payer: Medicare HMO | Admitting: Adult Health

## 2021-03-15 ENCOUNTER — Encounter: Payer: Self-pay | Admitting: Adult Health

## 2021-03-15 DIAGNOSIS — S069X0S Unspecified intracranial injury without loss of consciousness, sequela: Secondary | ICD-10-CM | POA: Diagnosis not present

## 2021-03-15 DIAGNOSIS — Z87820 Personal history of traumatic brain injury: Secondary | ICD-10-CM | POA: Diagnosis not present

## 2021-03-15 DIAGNOSIS — M1A9XX0 Chronic gout, unspecified, without tophus (tophi): Secondary | ICD-10-CM

## 2021-03-15 DIAGNOSIS — F3340 Major depressive disorder, recurrent, in remission, unspecified: Secondary | ICD-10-CM | POA: Diagnosis not present

## 2021-03-15 DIAGNOSIS — F068 Other specified mental disorders due to known physiological condition: Secondary | ICD-10-CM
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
## 2021-03-16 DIAGNOSIS — F320 Major depressive disorder, single episode, mild: Secondary | ICD-10-CM | POA: Diagnosis not present

## 2021-03-16 LAB — COMPREHENSIVE METABOLIC PANEL
Calcium: 8.9 (ref 8.7–10.7)
GFR calc Af Amer: >90
GFR calc non Af Amer: 84.12

## 2021-03-16 LAB — CBC AND DIFFERENTIAL
HCT: 40 — AB (ref 41–53)
Hemoglobin: 13.5 (ref 13.5–17.5)
Neutrophils Absolute: 3.9
Platelets: 8 — AB (ref 150–399)
WBC: 5.7

## 2021-03-16 LAB — BASIC METABOLIC PANEL
BUN: 17 (ref 4–21)
CO2: 24 — AB (ref 13–22)
Chloride: 104 (ref 99–108)
Creatinine: 0.9 (ref 0.6–1.3)
Glucose: 87
Potassium: 4.7 (ref 3.4–5.3)
Sodium: 139 (ref 137–147)

## 2021-03-16 LAB — CBC: RBC: 3.86 — AB (ref 3.87–5.11)

## 2021-04-15 DIAGNOSIS — R262 Difficulty in walking, not elsewhere classified: Secondary | ICD-10-CM | POA: Diagnosis not present

## 2021-04-15 DIAGNOSIS — M79674 Pain in right toe(s): Secondary | ICD-10-CM | POA: Diagnosis not present

## 2021-04-15 DIAGNOSIS — M79675 Pain in left toe(s): Secondary | ICD-10-CM | POA: Diagnosis not present

## 2021-04-15 DIAGNOSIS — B351 Tinea unguium: Secondary | ICD-10-CM | POA: Diagnosis not present

## 2021-04-21 DIAGNOSIS — R4189 Other symptoms and signs involving cognitive functions and awareness: Secondary | ICD-10-CM | POA: Diagnosis not present

## 2021-04-21 DIAGNOSIS — F338 Other recurrent depressive disorders: Secondary | ICD-10-CM | POA: Diagnosis not present

## 2021-04-21 DIAGNOSIS — Z87820 Personal history of traumatic brain injury: Secondary | ICD-10-CM | POA: Diagnosis not present

## 2021-04-27 ENCOUNTER — Encounter: Payer: Self-pay | Admitting: Adult Health

## 2021-04-27 ENCOUNTER — Non-Acute Institutional Stay (SKILLED_NURSING_FACILITY): Payer: Medicare HMO | Admitting: Adult Health

## 2021-04-27 DIAGNOSIS — S069X0S Unspecified intracranial injury without loss of consciousness, sequela: Secondary | ICD-10-CM

## 2021-04-27 DIAGNOSIS — F068 Other specified mental disorders due to known physiological condition: Secondary | ICD-10-CM | POA: Diagnosis not present

## 2021-04-27 DIAGNOSIS — F3340 Major depressive disorder, recurrent, in remission, unspecified: Secondary | ICD-10-CM

## 2021-04-27 DIAGNOSIS — M1A9XX0 Chronic gout, unspecified, without tophus (tophi): Secondary | ICD-10-CM

## 2021-04-27 DIAGNOSIS — Z87820 Personal history of traumatic brain injury: Secondary | ICD-10-CM | POA: Diagnosis not present
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
## 2021-04-28 DIAGNOSIS — M6281 Muscle weakness (generalized): Secondary | ICD-10-CM | POA: Diagnosis not present

## 2021-04-28 DIAGNOSIS — Z87820 Personal history of traumatic brain injury: Secondary | ICD-10-CM | POA: Diagnosis not present

## 2021-05-12 ENCOUNTER — Non-Acute Institutional Stay (SKILLED_NURSING_FACILITY): Payer: Medicare HMO | Admitting: Adult Health

## 2021-05-12 ENCOUNTER — Encounter: Payer: Self-pay | Admitting: Adult Health

## 2021-05-12 DIAGNOSIS — F3340 Major depressive disorder, recurrent, in remission, unspecified: Secondary | ICD-10-CM | POA: Diagnosis not present

## 2021-05-12 DIAGNOSIS — M1A9XX0 Chronic gout, unspecified, without tophus (tophi): Secondary | ICD-10-CM

## 2021-05-12 DIAGNOSIS — S069X0S Unspecified intracranial injury without loss of consciousness, sequela: Secondary | ICD-10-CM

## 2021-05-12 DIAGNOSIS — Z7189 Other specified counseling: Secondary | ICD-10-CM | POA: Diagnosis not present

## 2021-05-12 DIAGNOSIS — R4189 Other symptoms and signs involving cognitive functions and awareness: Secondary | ICD-10-CM

## 2021-05-12 DIAGNOSIS — F068 Other specified mental disorders due to known physiological condition: Secondary | ICD-10-CM | POA: Diagnosis not present
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
## 2021-06-15 ENCOUNTER — Non-Acute Institutional Stay (SKILLED_NURSING_FACILITY): Payer: Medicare HMO | Admitting: Internal Medicine

## 2021-06-15 ENCOUNTER — Encounter: Payer: Self-pay | Admitting: Internal Medicine

## 2021-06-15 DIAGNOSIS — S069X0S Unspecified intracranial injury without loss of consciousness, sequela: Secondary | ICD-10-CM

## 2021-06-15 DIAGNOSIS — M1A9XX0 Chronic gout, unspecified, without tophus (tophi): Secondary | ICD-10-CM | POA: Diagnosis not present

## 2021-06-15 DIAGNOSIS — C8588 Other specified types of non-Hodgkin lymphoma, lymph nodes of multiple sites: Secondary | ICD-10-CM

## 2021-06-15 DIAGNOSIS — F068 Other specified mental disorders due to known physiological condition: Secondary | ICD-10-CM | POA: Diagnosis not present

## 2021-06-15 DIAGNOSIS — N179 Acute kidney failure, unspecified: Secondary | ICD-10-CM
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
## 2021-06-15 NOTE — Assessment & Plan Note (Addendum)
Clinically there is no evidence of recurrence of lymphoma.  CBC in August had erroneous plat count with value of 8000.  Hemoglobin was normal at 13.5 but hematocrit minimally reduced at 40.  No bleeding dyscrasias reported at the SNF.  CBC update indicated.

## 2021-06-15 NOTE — Assessment & Plan Note (Signed)
Renal function is high stage II with creatinine of 0.9 and GFR of 84.12.  He is on allopurinol and the need for this medicine will be verified.

## 2021-06-15 NOTE — Patient Instructions (Signed)
See assessment and plan under each diagnosis in the problem list and acutely for this visit 

## 2021-06-15 NOTE — Assessment & Plan Note (Addendum)
He exhibits no aggressive or abusive behavior.  He exhibits confabulation and coprolalia and repeatedly interrupts his own conversation with the command "shut up, Tyler [name]"

## 2021-06-15 NOTE — Assessment & Plan Note (Signed)
I question the need for the allopurinol.  Uric acid can be checked with the repeat CBC and differential.

## 2021-06-21 LAB — CBC AND DIFFERENTIAL
HCT: 45 (ref 41–53)
Hemoglobin: 14.2 (ref 13.5–17.5)
Platelets: 225 (ref 150–399)
WBC: 5.8

## 2021-06-21 LAB — CBC: RBC: 4.13 (ref 3.87–5.11)

## 2021-08-02 DIAGNOSIS — Z87820 Personal history of traumatic brain injury: Secondary | ICD-10-CM | POA: Diagnosis not present

## 2021-08-02 DIAGNOSIS — R1311 Dysphagia, oral phase: Secondary | ICD-10-CM | POA: Diagnosis not present

## 2021-08-03 DIAGNOSIS — R1311 Dysphagia, oral phase: Secondary | ICD-10-CM | POA: Diagnosis not present

## 2021-08-03 DIAGNOSIS — Z87820 Personal history of traumatic brain injury: Secondary | ICD-10-CM | POA: Diagnosis not present

## 2021-08-04 DIAGNOSIS — Z87820 Personal history of traumatic brain injury: Secondary | ICD-10-CM | POA: Diagnosis not present

## 2021-08-04 DIAGNOSIS — R1311 Dysphagia, oral phase: Secondary | ICD-10-CM | POA: Diagnosis not present

## 2021-08-05 ENCOUNTER — Encounter: Payer: Self-pay | Admitting: Adult Health

## 2021-08-05 ENCOUNTER — Non-Acute Institutional Stay (SKILLED_NURSING_FACILITY): Payer: Medicare HMO | Admitting: Adult Health

## 2021-08-05 DIAGNOSIS — S069X0S Unspecified intracranial injury without loss of consciousness, sequela: Secondary | ICD-10-CM

## 2021-08-05 DIAGNOSIS — F339 Major depressive disorder, recurrent, unspecified: Secondary | ICD-10-CM | POA: Diagnosis not present

## 2021-08-05 DIAGNOSIS — M1A9XX0 Chronic gout, unspecified, without tophus (tophi): Secondary | ICD-10-CM

## 2021-08-05 DIAGNOSIS — Z87820 Personal history of traumatic brain injury: Secondary | ICD-10-CM | POA: Diagnosis not present

## 2021-08-05 DIAGNOSIS — R1311 Dysphagia, oral phase: Secondary | ICD-10-CM | POA: Diagnosis not present

## 2021-08-05 DIAGNOSIS — Z23 Encounter for immunization: Secondary | ICD-10-CM

## 2021-08-05 DIAGNOSIS — F068 Other specified mental disorders due to known physiological condition: Secondary | ICD-10-CM | POA: Diagnosis not present
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
## 2021-08-06 DIAGNOSIS — R1311 Dysphagia, oral phase: Secondary | ICD-10-CM | POA: Diagnosis not present

## 2021-08-06 DIAGNOSIS — Z87820 Personal history of traumatic brain injury: Secondary | ICD-10-CM | POA: Diagnosis not present

## 2021-08-09 ENCOUNTER — Encounter: Payer: Self-pay | Admitting: Adult Health

## 2021-08-09 ENCOUNTER — Non-Acute Institutional Stay (SKILLED_NURSING_FACILITY): Payer: Medicare HMO | Admitting: Adult Health

## 2021-08-09 DIAGNOSIS — Z87820 Personal history of traumatic brain injury: Secondary | ICD-10-CM

## 2021-08-09 DIAGNOSIS — M1A9XX0 Chronic gout, unspecified, without tophus (tophi): Secondary | ICD-10-CM

## 2021-08-09 DIAGNOSIS — R1311 Dysphagia, oral phase: Secondary | ICD-10-CM | POA: Diagnosis not present

## 2021-08-09 DIAGNOSIS — M6281 Muscle weakness (generalized): Secondary | ICD-10-CM | POA: Diagnosis not present

## 2021-08-09 DIAGNOSIS — F339 Major depressive disorder, recurrent, unspecified: Secondary | ICD-10-CM | POA: Diagnosis not present

## 2021-08-09 DIAGNOSIS — Z7189 Other specified counseling: Secondary | ICD-10-CM

## 2021-08-09 DIAGNOSIS — R2681 Unsteadiness on feet: Secondary | ICD-10-CM | POA: Diagnosis not present
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
## 2021-08-10 DIAGNOSIS — R1311 Dysphagia, oral phase: Secondary | ICD-10-CM | POA: Diagnosis not present

## 2021-08-10 DIAGNOSIS — Z87820 Personal history of traumatic brain injury: Secondary | ICD-10-CM | POA: Diagnosis not present

## 2021-08-11 DIAGNOSIS — M6281 Muscle weakness (generalized): Secondary | ICD-10-CM | POA: Diagnosis not present

## 2021-08-11 DIAGNOSIS — R1311 Dysphagia, oral phase: Secondary | ICD-10-CM | POA: Diagnosis not present

## 2021-08-11 DIAGNOSIS — Z87820 Personal history of traumatic brain injury: Secondary | ICD-10-CM | POA: Diagnosis not present

## 2021-08-11 DIAGNOSIS — R2681 Unsteadiness on feet: Secondary | ICD-10-CM | POA: Diagnosis not present

## 2021-08-12 DIAGNOSIS — Z87820 Personal history of traumatic brain injury: Secondary | ICD-10-CM | POA: Diagnosis not present

## 2021-08-12 DIAGNOSIS — R1311 Dysphagia, oral phase: Secondary | ICD-10-CM | POA: Diagnosis not present

## 2021-08-13 DIAGNOSIS — Z87820 Personal history of traumatic brain injury: Secondary | ICD-10-CM | POA: Diagnosis not present

## 2021-08-13 DIAGNOSIS — R1311 Dysphagia, oral phase: Secondary | ICD-10-CM | POA: Diagnosis not present

## 2021-08-16 DIAGNOSIS — R1311 Dysphagia, oral phase: Secondary | ICD-10-CM | POA: Diagnosis not present

## 2021-08-16 DIAGNOSIS — M6281 Muscle weakness (generalized): Secondary | ICD-10-CM | POA: Diagnosis not present

## 2021-08-16 DIAGNOSIS — R2681 Unsteadiness on feet: Secondary | ICD-10-CM | POA: Diagnosis not present

## 2021-08-16 DIAGNOSIS — Z87820 Personal history of traumatic brain injury: Secondary | ICD-10-CM | POA: Diagnosis not present

## 2021-08-17 DIAGNOSIS — M6281 Muscle weakness (generalized): Secondary | ICD-10-CM | POA: Diagnosis not present

## 2021-08-17 DIAGNOSIS — Z87820 Personal history of traumatic brain injury: Secondary | ICD-10-CM | POA: Diagnosis not present

## 2021-08-17 DIAGNOSIS — R1311 Dysphagia, oral phase: Secondary | ICD-10-CM | POA: Diagnosis not present

## 2021-08-17 DIAGNOSIS — R2681 Unsteadiness on feet: Secondary | ICD-10-CM | POA: Diagnosis not present

## 2021-08-19 DIAGNOSIS — Z87820 Personal history of traumatic brain injury: Secondary | ICD-10-CM | POA: Diagnosis not present

## 2021-08-19 DIAGNOSIS — F411 Generalized anxiety disorder: Secondary | ICD-10-CM | POA: Diagnosis not present

## 2021-08-19 DIAGNOSIS — F331 Major depressive disorder, recurrent, moderate: Secondary | ICD-10-CM | POA: Diagnosis not present

## 2021-08-19 DIAGNOSIS — F02B0 Dementia in other diseases classified elsewhere, moderate, without behavioral disturbance, psychotic disturbance, mood disturbance, and anxiety: Secondary | ICD-10-CM | POA: Diagnosis not present

## 2021-09-02 ENCOUNTER — Non-Acute Institutional Stay (SKILLED_NURSING_FACILITY): Payer: Medicare HMO | Admitting: Adult Health

## 2021-09-02 ENCOUNTER — Encounter: Payer: Self-pay | Admitting: Adult Health

## 2021-09-02 DIAGNOSIS — U071 COVID-19: Secondary | ICD-10-CM
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
## 2021-09-03 DIAGNOSIS — I2510 Atherosclerotic heart disease of native coronary artery without angina pectoris: Secondary | ICD-10-CM | POA: Diagnosis not present

## 2021-09-03 DIAGNOSIS — I10 Essential (primary) hypertension: Secondary | ICD-10-CM | POA: Diagnosis not present

## 2021-09-03 LAB — BASIC METABOLIC PANEL
BUN: 18 (ref 4–21)
CO2: 23 — AB (ref 13–22)
Chloride: 102 (ref 99–108)
Creatinine: 0.9 (ref 0.6–1.3)
Glucose: 90
Potassium: 4.3 (ref 3.4–5.3)
Sodium: 138 (ref 137–147)

## 2021-09-03 LAB — HEPATIC FUNCTION PANEL
ALT: 11 (ref 10–40)
AST: 19 (ref 14–40)
Alkaline Phosphatase: 84 (ref 25–125)
Bilirubin, Total: 0.3

## 2021-09-03 LAB — CBC AND DIFFERENTIAL
HCT: 43 (ref 41–53)
Hemoglobin: 14.4 (ref 13.5–17.5)
Platelets: 201 (ref 150–399)
WBC: 6.2

## 2021-09-03 LAB — COMPREHENSIVE METABOLIC PANEL
Albumin: 3.7 (ref 3.5–5.0)
Calcium: 9.2 (ref 8.7–10.7)

## 2021-09-03 LAB — CBC: RBC: 4.06 (ref 3.87–5.11)

## 2021-09-06 ENCOUNTER — Encounter: Payer: Self-pay | Admitting: Internal Medicine

## 2021-09-06 ENCOUNTER — Non-Acute Institutional Stay (SKILLED_NURSING_FACILITY): Payer: Medicare HMO | Admitting: Internal Medicine

## 2021-09-06 DIAGNOSIS — D7589 Other specified diseases of blood and blood-forming organs: Secondary | ICD-10-CM

## 2021-09-06 DIAGNOSIS — U071 COVID-19: Secondary | ICD-10-CM | POA: Diagnosis not present

## 2021-09-06 DIAGNOSIS — F482 Pseudobulbar affect: Secondary | ICD-10-CM

## 2021-09-06 DIAGNOSIS — C8588 Other specified types of non-Hodgkin lymphoma, lymph nodes of multiple sites: Secondary | ICD-10-CM | POA: Diagnosis not present

## 2021-09-06 NOTE — Assessment & Plan Note (Addendum)
No lymphadenopathy or organomegaly present.  CBC and differential essentially normal.  Minimal increase in monocyte count at 15.1% with normals of 0.1-12.  Macrocytosis with MCV of 104.8 without associated anemia.  H/H 14.4/42.6. Plan update of B12 & folate levels  To assess macrocytosis etiology

## 2021-09-06 NOTE — Assessment & Plan Note (Addendum)
Today he did not exhibit profane speech (coprolalia) but repeatedly cut off his responses with "shut up, Dekendrick!"

## 2021-09-06 NOTE — Assessment & Plan Note (Addendum)
Chest x-ray was refused.  He remains afebrile with adequate oxygenation.  Continue to monitor on the oral antiviral. Consider short burst of steroids due to elevated CRP.

## 2021-09-06 NOTE — Progress Notes (Signed)
NURSING HOME LOCATION:  [facility] ROOM NUMBER:  [id number]  CODE STATUS:  Full Code  PCP:  Durenda Age NP,PSC  This is a nursing facility follow up visit of chronic medical diagnoses & to document compliance with Regulation 483.30 (c) in [employer] [place] Manual Phase 2 which mandates caregiver visit ( visits can alternate among physician, PA or NP as per statutes) within 10 days of 30 days / 60 days/ 90 days post admission to SNF date    Interim medical record and care since last SNF visit was updated with review of diagnostic studies and change in clinical status since last visit were documented.  HPI: He is due for follow-up of chronic medical diagnoses; but he also tested positive for COVID last week.  He has received 1 COVID-vaccine. 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 Because of the positive COVID screening; labs were performed.  Comprehensive metabolic profile was completely normal except for osmolality of 276.9 ,slightly reduced from normals of 285-295.  D-dimer was high normal at 0.48.  CRP was 5.32 with normals of less than 0.5.  White count was normal with minimal increase in the monocytes of 15.1% with normal of 0.1-12.  H/H was 14.4/42.6.  Despite absence of anemia; MCV was 104.8.  Epic reveals vitamin B12 level of 327 on 11/21/2019. Oral COVID antiviral was initiated.  Review of systems: Dementia invalidated responses.  He denies any active symptoms at this time ,with special reference to [place]. Constitutional: No fever, significant weight change, fatigue  Eyes: No redness, discharge, pain, vision change ENT/mouth: No nasal congestion,  purulent discharge, earache, change in hearing, sore throat  Cardiovascular: No chest pain, palpitations, paroxysmal nocturnal  dyspnea, claudication, edema  Respiratory: No cough, sputum production, hemoptysis, DOE, significant snoring, apnea   Gastrointestinal: No heartburn, dysphagia, abdominal pain, nausea /vomiting, rectal bleeding, melena, change in bowels Genitourinary: No dysuria, hematuria, pyuria, incontinence, nocturia Musculoskeletal: No joint stiffness, joint swelling, weakness, pain Dermatologic: No rash, pruritus, change in appearance of skin Neurologic: No dizziness, headache, syncope, seizures, numbness, tingling Psychiatric: No significant anxiety, depression, insomnia, anorexia Endocrine: No change in hair/skin/nails, excessive thirst, excessive hunger, excessive urination  Hematologic/lymphatic: No significant bruising, lymphadenopathy, abnormal bleeding Allergy/immunology: No itchy/watery eyes, significant sneezing, urticaria, angioedema  Physical exam:  Pertinent or positive findings: It was approximately 12:30 pm yet he had he was still asleep in bed.  It appeared that his breakfast on the bedside table was not eaten .  He was easily aroused.  Pattern alopecia is present.  Faint facial erythema is suggested over the nose and malar areas.  Teeth are coated.  Well-healed trach scar present.  Breath sounds are decreased.  Pedal pulses were strong.  Clubbing of the nailbeds is suggested.  He repeatedly interrupted himself , saying "shut up, Mako" , as he answered my questions.  General appearance: no acute distress, increased work of breathing is present.   Lymphatic: No lymphadenopathy about the head, neck, axilla. Eyes: No conjunctival inflammation or lid edema is present. There is no scleral icterus. Ears:  External ear exam shows no significant lesions or deformities.   Nose:  External nasal examination shows no deformity or inflammation. Nasal mucosa are pink and moist without lesions, exudates Oral exam:   There is no oropharyngeal erythema or exudate. Neck:  No  thyromegaly, masses, tenderness  noted.    Heart:  Normal rate and regular rhythm. S1 and S2 normal without gallop, murmur, click, rub .  Lungs:  without wheezes, rhonchi, rales, rubs. Abdomen: Bowel sounds are normal. Abdomen is soft and nontender with no organomegaly, hernias, masses. GU: Deferred  Extremities:  No cyanosis, clubbing, edema  Skin: Warm & dry w/o tenting. No significant lesions   See summary under each active problem in the Problem List with associated updated therapeutic plan

## 2021-09-06 NOTE — Patient Instructions (Signed)
See assessment and plan under each diagnosis in the problem list and acutely for this visit 

## 2021-09-06 NOTE — Assessment & Plan Note (Addendum)
Macrocytosis persist without anemia. Verify B12 level current as this was low normal in April 2021 & check folate level .

## 2021-09-08 DIAGNOSIS — Z79899 Other long term (current) drug therapy: Secondary | ICD-10-CM | POA: Diagnosis not present

## 2021-09-08 LAB — VITAMIN B12: Vitamin B-12: 483

## 2021-09-14 DIAGNOSIS — R2681 Unsteadiness on feet: Secondary | ICD-10-CM | POA: Diagnosis not present

## 2021-09-14 DIAGNOSIS — Z87820 Personal history of traumatic brain injury: Secondary | ICD-10-CM | POA: Diagnosis not present

## 2021-09-14 DIAGNOSIS — M6281 Muscle weakness (generalized): Secondary | ICD-10-CM | POA: Diagnosis not present

## 2021-09-15 DIAGNOSIS — F02B0 Dementia in other diseases classified elsewhere, moderate, without behavioral disturbance, psychotic disturbance, mood disturbance, and anxiety: Secondary | ICD-10-CM | POA: Diagnosis not present

## 2021-09-15 DIAGNOSIS — F411 Generalized anxiety disorder: Secondary | ICD-10-CM | POA: Diagnosis not present

## 2021-09-15 DIAGNOSIS — F331 Major depressive disorder, recurrent, moderate: Secondary | ICD-10-CM | POA: Diagnosis not present

## 2021-09-15 DIAGNOSIS — Z87820 Personal history of traumatic brain injury: Secondary | ICD-10-CM | POA: Diagnosis not present

## 2021-09-16 ENCOUNTER — Non-Acute Institutional Stay (INDEPENDENT_AMBULATORY_CARE_PROVIDER_SITE_OTHER): Payer: Medicare HMO | Admitting: Adult Health

## 2021-09-16 ENCOUNTER — Encounter: Payer: Self-pay | Admitting: Adult Health

## 2021-09-16 DIAGNOSIS — Z0000 Encounter for general adult medical examination without abnormal findings: Secondary | ICD-10-CM | POA: Diagnosis not present
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
## 2021-09-17 DIAGNOSIS — B179 Acute viral hepatitis, unspecified: Secondary | ICD-10-CM | POA: Diagnosis not present

## 2021-09-23 LAB — HM HEPATITIS C SCREENING LAB: HM Hepatitis Screen: NEGATIVE

## 2021-09-24 ENCOUNTER — Encounter: Payer: Self-pay | Admitting: Adult Health

## 2021-09-24 ENCOUNTER — Non-Acute Institutional Stay (SKILLED_NURSING_FACILITY): Payer: Medicare HMO | Admitting: Adult Health

## 2021-09-24 DIAGNOSIS — F339 Major depressive disorder, recurrent, unspecified: Secondary | ICD-10-CM

## 2021-09-24 DIAGNOSIS — Z87820 Personal history of traumatic brain injury: Secondary | ICD-10-CM

## 2021-09-24 DIAGNOSIS — M1A9XX0 Chronic gout, unspecified, without tophus (tophi): Secondary | ICD-10-CM | POA: Diagnosis not present
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
## 2021-11-01 ENCOUNTER — Encounter: Payer: Self-pay | Admitting: Adult Health

## 2021-11-01 ENCOUNTER — Non-Acute Institutional Stay (SKILLED_NURSING_FACILITY): Payer: Medicare HMO | Admitting: Adult Health

## 2021-11-01 DIAGNOSIS — F339 Major depressive disorder, recurrent, unspecified: Secondary | ICD-10-CM | POA: Diagnosis not present

## 2021-11-01 DIAGNOSIS — M1A9XX0 Chronic gout, unspecified, without tophus (tophi): Secondary | ICD-10-CM | POA: Diagnosis not present

## 2021-11-01 DIAGNOSIS — Z87820 Personal history of traumatic brain injury: Secondary | ICD-10-CM | POA: Diagnosis not present
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
## 2021-11-08 DIAGNOSIS — Z87820 Personal history of traumatic brain injury: Secondary | ICD-10-CM | POA: Diagnosis not present

## 2021-11-08 DIAGNOSIS — M6259 Muscle wasting and atrophy, not elsewhere classified, multiple sites: Secondary | ICD-10-CM | POA: Diagnosis not present

## 2021-11-09 ENCOUNTER — Encounter: Payer: Self-pay | Admitting: Adult Health

## 2021-11-09 ENCOUNTER — Non-Acute Institutional Stay (SKILLED_NURSING_FACILITY): Payer: Medicare HMO | Admitting: Adult Health

## 2021-11-09 DIAGNOSIS — Z87820 Personal history of traumatic brain injury: Secondary | ICD-10-CM | POA: Diagnosis not present

## 2021-11-09 DIAGNOSIS — M1A9XX0 Chronic gout, unspecified, without tophus (tophi): Secondary | ICD-10-CM

## 2021-11-09 DIAGNOSIS — Z7189 Other specified counseling: Secondary | ICD-10-CM
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
## 2021-11-12 DIAGNOSIS — F338 Other recurrent depressive disorders: Secondary | ICD-10-CM | POA: Diagnosis not present

## 2021-11-23 ENCOUNTER — Encounter: Payer: Self-pay | Admitting: Internal Medicine

## 2021-11-23 ENCOUNTER — Non-Acute Institutional Stay (SKILLED_NURSING_FACILITY): Payer: Medicare HMO | Admitting: Internal Medicine

## 2021-11-23 DIAGNOSIS — S069X0S Unspecified intracranial injury without loss of consciousness, sequela: Secondary | ICD-10-CM | POA: Diagnosis not present

## 2021-11-23 DIAGNOSIS — C8588 Other specified types of non-Hodgkin lymphoma, lymph nodes of multiple sites: Secondary | ICD-10-CM

## 2021-11-23 DIAGNOSIS — F068 Other specified mental disorders due to known physiological condition: Secondary | ICD-10-CM

## 2021-11-23 DIAGNOSIS — D539 Nutritional anemia, unspecified: Secondary | ICD-10-CM | POA: Diagnosis not present

## 2021-11-23 NOTE — Assessment & Plan Note (Signed)
09/03/2021 all blood counts normal. ?

## 2021-11-23 NOTE — Progress Notes (Signed)
? ?NURSING HOME LOCATION:  [place] ?ROOM NUMBER:  118 B ? ?CODE STATUS: DNR ? ?PCP: Durenda Age NP ? ?This is a nursing facility follow up visit of chronic medical diagnoses & to document compliance with Regulation 483.30 (c) in [employer] [place] Phase [employer] which mandates caregiver visit ( visits can alternate among physician, PA or NP as per statutes) within 10 days of 30 days / 60 days/ 90 days post admission to SNF date   ? ?Interim medical record and care since last SNF visit was updated with review of diagnostic studies and change in clinical status since last visit were documented. ? 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 ?Comprehensive metabolic profile was performed 09/03/2021 and was completely normal.  Blood counts were also normal.  The last TSH on record was on 10/27/2020 with a value of 4.03. ? ?Review of systems: His pseudobulbar affect invalidates responses which were all "no" to signs and symptoms.  He stated that "everything is okay".  He interspersed his conversation with profanity followed by the command "shut up, Shanon Brow".  He referred to himself as "pretty dumb and stupid".  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 ? ?Constitutional: No fever, significant weight change, fatigue  ?Eyes: No redness, discharge, pain, vision change ?ENT/mouth: No nasal congestion,  purulent discharge, earache, change in hearing, sore throat  ?Cardiovascular: No chest pain, palpitations, paroxysmal nocturnal dyspnea, claudication, edema  ?Respiratory: No cough, sputum production, hemoptysis, DOE, significant snoring, apnea   ?Gastrointestinal: No heartburn, dysphagia, abdominal pain, nausea /vomiting, rectal bleeding, melena, change in bowels ?Genitourinary: No dysuria,  hematuria, pyuria, incontinence, nocturia ?Musculoskeletal: No joint stiffness, joint swelling, weakness, pain ?Dermatologic: No rash, pruritus, change in appearance of skin ?Neurologic: No dizziness, headache, syncope, seizures, numbness, tingling ?Psychiatric: No significant anxiety, depression, insomnia, anorexia ?Endocrine: No change in hair/skin/nails, excessive thirst, excessive hunger, excessive urination  ?Hematologic/lymphatic: No significant bruising, lymphadenopathy, abnormal bleeding ?Allergy/immunology: No itchy/watery eyes, significant sneezing, urticaria, angioedema ? ?Physical exam:  ?Pertinent or positive findings: Hair is thin and disheveled and beard and mustache are unkempt.  Bilateral ptosis is present.  Facies are weathered.  Teeth are intact but markedly coated. Breath sounds are somewhat decreased.  He has trace-1/2+ edema at the sock line.  Pedal pulses are somewhat decreased.  He is wearing an antiwandering bracelet over the right ankle. ? ?General appearance: Adequately nourished; no acute distress, increased work of breathing is present.   ?Lymphatic: No lymphadenopathy about the head, neck, axilla. ?Eyes: No conjunctival inflammation or lid edema is present. There is no scleral icterus. ?Ears:  External ear exam shows no significant lesions or deformities.   ?Nose:  External nasal examination shows no deformity or inflammation. Nasal mucosa are pink and moist without lesions, exudates ?Neck:  No thyromegaly, masses, tenderness noted.    ?Heart:  Normal rate and regular rhythm. S1 and S2 normal without gallop, murmur, click, rub .  ?Lungs: without wheezes, rhonchi, rales, rubs. ?Abdomen: Bowel sounds are normal. Abdomen is soft and nontender with no organomegaly, hernias, masses. ?GU: Deferred  ?Extremities:  No cyanosis  ?Neurologic exam :Balance, Rhomberg, finger to nose testing could not be completed due to clinical state ?Skin: Warm & dry w/o tenting. ?No significant lesions or  rash. ? ?See summary under each active problem  in the Problem List with associated updated therapeutic plan ? ? ?

## 2021-11-23 NOTE — Assessment & Plan Note (Addendum)
No constitutional symptoms.  No organomegaly or lymphadenopathy. He has no comprehension of having had lymphoma. ?

## 2021-11-23 NOTE — Patient Instructions (Signed)
See assessment and plan under each diagnosis in the problem list and acutely for this visit 

## 2021-11-23 NOTE — Assessment & Plan Note (Addendum)
Unchanged. He demonstrates coprophilia. No behavioral issues reported by staff. ?

## 2022-07-22 DIAGNOSIS — M6281 Muscle weakness (generalized): Secondary | ICD-10-CM | POA: Diagnosis not present

## 2022-07-22 DIAGNOSIS — Z87820 Personal history of traumatic brain injury: Secondary | ICD-10-CM | POA: Diagnosis not present

## 2022-07-25 DIAGNOSIS — Z87820 Personal history of traumatic brain injury: Secondary | ICD-10-CM | POA: Diagnosis not present

## 2022-07-25 DIAGNOSIS — M6281 Muscle weakness (generalized): Secondary | ICD-10-CM | POA: Diagnosis not present

## 2022-07-26 DIAGNOSIS — Z87820 Personal history of traumatic brain injury: Secondary | ICD-10-CM | POA: Diagnosis not present

## 2022-07-26 DIAGNOSIS — M6281 Muscle weakness (generalized): Secondary | ICD-10-CM | POA: Diagnosis not present

## 2022-07-27 DIAGNOSIS — Z87820 Personal history of traumatic brain injury: Secondary | ICD-10-CM | POA: Diagnosis not present

## 2022-07-27 DIAGNOSIS — M6281 Muscle weakness (generalized): Secondary | ICD-10-CM | POA: Diagnosis not present

## 2022-07-28 DIAGNOSIS — Z87820 Personal history of traumatic brain injury: Secondary | ICD-10-CM | POA: Diagnosis not present

## 2022-07-28 DIAGNOSIS — M6281 Muscle weakness (generalized): Secondary | ICD-10-CM | POA: Diagnosis not present

## 2022-07-29 DIAGNOSIS — Z87820 Personal history of traumatic brain injury: Secondary | ICD-10-CM | POA: Diagnosis not present

## 2022-07-29 DIAGNOSIS — M6281 Muscle weakness (generalized): Secondary | ICD-10-CM | POA: Diagnosis not present

## 2022-08-01 DIAGNOSIS — M6281 Muscle weakness (generalized): Secondary | ICD-10-CM | POA: Diagnosis not present

## 2022-08-01 DIAGNOSIS — Z87820 Personal history of traumatic brain injury: Secondary | ICD-10-CM | POA: Diagnosis not present

## 2022-08-02 DIAGNOSIS — M6281 Muscle weakness (generalized): Secondary | ICD-10-CM | POA: Diagnosis not present

## 2022-08-02 DIAGNOSIS — Z87820 Personal history of traumatic brain injury: Secondary | ICD-10-CM | POA: Diagnosis not present

## 2022-08-03 DIAGNOSIS — Z87820 Personal history of traumatic brain injury: Secondary | ICD-10-CM | POA: Diagnosis not present

## 2022-08-03 DIAGNOSIS — M6281 Muscle weakness (generalized): Secondary | ICD-10-CM | POA: Diagnosis not present

## 2022-09-30 DIAGNOSIS — R1311 Dysphagia, oral phase: Secondary | ICD-10-CM | POA: Diagnosis not present

## 2022-09-30 DIAGNOSIS — C8518 Unspecified B-cell lymphoma, lymph nodes of multiple sites: Secondary | ICD-10-CM | POA: Diagnosis not present

## 2022-10-05 DIAGNOSIS — C8518 Unspecified B-cell lymphoma, lymph nodes of multiple sites: Secondary | ICD-10-CM | POA: Diagnosis not present

## 2022-10-05 DIAGNOSIS — R1311 Dysphagia, oral phase: Secondary | ICD-10-CM | POA: Diagnosis not present

## 2022-10-06 DIAGNOSIS — R1311 Dysphagia, oral phase: Secondary | ICD-10-CM | POA: Diagnosis not present

## 2022-10-06 DIAGNOSIS — C8518 Unspecified B-cell lymphoma, lymph nodes of multiple sites: Secondary | ICD-10-CM | POA: Diagnosis not present

## 2022-10-07 DIAGNOSIS — R1311 Dysphagia, oral phase: Secondary | ICD-10-CM | POA: Diagnosis not present

## 2022-10-07 DIAGNOSIS — C8518 Unspecified B-cell lymphoma, lymph nodes of multiple sites: Secondary | ICD-10-CM | POA: Diagnosis not present

## 2022-10-12 DIAGNOSIS — R1311 Dysphagia, oral phase: Secondary | ICD-10-CM | POA: Diagnosis not present

## 2022-10-12 DIAGNOSIS — C8518 Unspecified B-cell lymphoma, lymph nodes of multiple sites: Secondary | ICD-10-CM | POA: Diagnosis not present

## 2022-10-13 DIAGNOSIS — R1311 Dysphagia, oral phase: Secondary | ICD-10-CM | POA: Diagnosis not present

## 2022-10-13 DIAGNOSIS — C8518 Unspecified B-cell lymphoma, lymph nodes of multiple sites: Secondary | ICD-10-CM | POA: Diagnosis not present

## 2022-10-15 DIAGNOSIS — R1311 Dysphagia, oral phase: Secondary | ICD-10-CM | POA: Diagnosis not present

## 2022-10-15 DIAGNOSIS — C8518 Unspecified B-cell lymphoma, lymph nodes of multiple sites: Secondary | ICD-10-CM | POA: Diagnosis not present

## 2022-10-16 DIAGNOSIS — R1311 Dysphagia, oral phase: Secondary | ICD-10-CM | POA: Diagnosis not present

## 2022-10-16 DIAGNOSIS — C8518 Unspecified B-cell lymphoma, lymph nodes of multiple sites: Secondary | ICD-10-CM | POA: Diagnosis not present

## 2022-10-18 DIAGNOSIS — C8518 Unspecified B-cell lymphoma, lymph nodes of multiple sites: Secondary | ICD-10-CM | POA: Diagnosis not present

## 2022-10-18 DIAGNOSIS — R1311 Dysphagia, oral phase: Secondary | ICD-10-CM | POA: Diagnosis not present

## 2022-10-21 DIAGNOSIS — C8518 Unspecified B-cell lymphoma, lymph nodes of multiple sites: Secondary | ICD-10-CM | POA: Diagnosis not present

## 2022-10-21 DIAGNOSIS — R1311 Dysphagia, oral phase: Secondary | ICD-10-CM | POA: Diagnosis not present

## 2022-10-24 DIAGNOSIS — M6281 Muscle weakness (generalized): Secondary | ICD-10-CM | POA: Diagnosis not present

## 2022-10-24 DIAGNOSIS — S069X0S Unspecified intracranial injury without loss of consciousness, sequela: Secondary | ICD-10-CM | POA: Diagnosis not present

## 2022-10-24 DIAGNOSIS — R2681 Unsteadiness on feet: Secondary | ICD-10-CM | POA: Diagnosis not present

## 2022-10-24 DIAGNOSIS — R1311 Dysphagia, oral phase: Secondary | ICD-10-CM | POA: Diagnosis not present

## 2022-10-24 DIAGNOSIS — C8518 Unspecified B-cell lymphoma, lymph nodes of multiple sites: Secondary | ICD-10-CM | POA: Diagnosis not present

## 2022-11-09 ENCOUNTER — Emergency Department (HOSPITAL_COMMUNITY)
Admission: EM | Admit: 2022-11-09 | Discharge: 2022-11-09 | Disposition: A | Discharge status: Home / Self Care | Payer: Medicare HMO | Attending: Emergency Medicine | Admitting: Emergency Medicine

## 2022-11-09 ENCOUNTER — Encounter: Payer: Self-pay | Admitting: Hematology

## 2022-11-09 ENCOUNTER — Other Ambulatory Visit: Payer: Self-pay

## 2022-11-09 ENCOUNTER — Emergency Department (HOSPITAL_COMMUNITY): Payer: Medicare HMO

## 2022-11-09 ENCOUNTER — Encounter (HOSPITAL_COMMUNITY): Payer: Self-pay | Admitting: Emergency Medicine

## 2022-11-09 DIAGNOSIS — W19XXXA Unspecified fall, initial encounter: Secondary | ICD-10-CM

## 2022-11-09 DIAGNOSIS — W01198A Fall on same level from slipping, tripping and stumbling with subsequent striking against other object, initial encounter: Secondary | ICD-10-CM | POA: Insufficient documentation

## 2022-11-09 DIAGNOSIS — S0990XA Unspecified injury of head, initial encounter: Secondary | ICD-10-CM | POA: Diagnosis present

## 2022-11-09 DIAGNOSIS — S0083XA Contusion of other part of head, initial encounter: Secondary | ICD-10-CM | POA: Diagnosis not present

## 2022-11-09 NOTE — ED Notes (Signed)
[facility] was called, no ETA °
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
## 2022-11-09 NOTE — Discharge Instructions (Signed)
Thank you for coming to [facility] Emergency Department. You were seen for fall. We did an exam, labs, and imaging, and these showed a very small cut on your left eyebrow that was repaired with Steri-Strips.  Please keep these clean and dry.  They will fall off in approximately 7 days.  Please watch for signs of infection including worsening pain, redness, swelling, pus drainage, or fever.  You can use antibiotic ointment on the cut twice per day.  Please follow up with your primary care provider within 1 week as needed.  Do not hesitate to return to the ED or call 911 if you experience: -Worsening symptoms -Lightheadedness, passing out -Fevers/chills -Anything else that concerns you

## 2022-11-09 NOTE — ED Triage Notes (Signed)
Per [facility] pt coming from [facility] for a mechanical fall when rounding a corner. Patient has laceration to left forehead. No blood thinners. Denies any pain. Patient at his baseline.

## 2022-11-09 NOTE — ED Notes (Signed)
Spoke with Vaminita at [facility] and advised pt would be returning.

## 2022-11-10 DIAGNOSIS — C8518 Unspecified B-cell lymphoma, lymph nodes of multiple sites: Secondary | ICD-10-CM | POA: Diagnosis not present

## 2022-11-10 DIAGNOSIS — S069X0S Unspecified intracranial injury without loss of consciousness, sequela: Secondary | ICD-10-CM | POA: Diagnosis not present

## 2022-11-10 DIAGNOSIS — M6281 Muscle weakness (generalized): Secondary | ICD-10-CM | POA: Diagnosis not present

## 2022-11-10 DIAGNOSIS — R2681 Unsteadiness on feet: Secondary | ICD-10-CM | POA: Diagnosis not present

## 2022-11-10 DIAGNOSIS — R1311 Dysphagia, oral phase: Secondary | ICD-10-CM | POA: Diagnosis not present

## 2022-11-11 DIAGNOSIS — M6281 Muscle weakness (generalized): Secondary | ICD-10-CM | POA: Diagnosis not present

## 2022-11-11 DIAGNOSIS — C8518 Unspecified B-cell lymphoma, lymph nodes of multiple sites: Secondary | ICD-10-CM | POA: Diagnosis not present

## 2022-11-11 DIAGNOSIS — S069X0S Unspecified intracranial injury without loss of consciousness, sequela: Secondary | ICD-10-CM | POA: Diagnosis not present

## 2022-11-11 DIAGNOSIS — R2681 Unsteadiness on feet: Secondary | ICD-10-CM | POA: Diagnosis not present

## 2022-11-11 DIAGNOSIS — R1311 Dysphagia, oral phase: Secondary | ICD-10-CM | POA: Diagnosis not present

## 2022-11-12 DIAGNOSIS — M6281 Muscle weakness (generalized): Secondary | ICD-10-CM | POA: Diagnosis not present

## 2022-11-12 DIAGNOSIS — S069X0S Unspecified intracranial injury without loss of consciousness, sequela: Secondary | ICD-10-CM | POA: Diagnosis not present

## 2022-11-12 DIAGNOSIS — R1311 Dysphagia, oral phase: Secondary | ICD-10-CM | POA: Diagnosis not present

## 2022-11-12 DIAGNOSIS — C8518 Unspecified B-cell lymphoma, lymph nodes of multiple sites: Secondary | ICD-10-CM | POA: Diagnosis not present

## 2022-11-12 DIAGNOSIS — R2681 Unsteadiness on feet: Secondary | ICD-10-CM | POA: Diagnosis not present

## 2022-11-14 DIAGNOSIS — R1311 Dysphagia, oral phase: Secondary | ICD-10-CM | POA: Diagnosis not present

## 2022-11-14 DIAGNOSIS — R2681 Unsteadiness on feet: Secondary | ICD-10-CM | POA: Diagnosis not present

## 2022-11-14 DIAGNOSIS — M6281 Muscle weakness (generalized): Secondary | ICD-10-CM | POA: Diagnosis not present

## 2022-11-14 DIAGNOSIS — C8518 Unspecified B-cell lymphoma, lymph nodes of multiple sites: Secondary | ICD-10-CM | POA: Diagnosis not present

## 2022-11-14 DIAGNOSIS — S069X0S Unspecified intracranial injury without loss of consciousness, sequela: Secondary | ICD-10-CM | POA: Diagnosis not present

## 2022-11-15 DIAGNOSIS — R2681 Unsteadiness on feet: Secondary | ICD-10-CM | POA: Diagnosis not present

## 2022-11-15 DIAGNOSIS — C8518 Unspecified B-cell lymphoma, lymph nodes of multiple sites: Secondary | ICD-10-CM | POA: Diagnosis not present

## 2022-11-15 DIAGNOSIS — S069X0S Unspecified intracranial injury without loss of consciousness, sequela: Secondary | ICD-10-CM | POA: Diagnosis not present

## 2022-11-15 DIAGNOSIS — R1311 Dysphagia, oral phase: Secondary | ICD-10-CM | POA: Diagnosis not present

## 2022-11-15 DIAGNOSIS — M6281 Muscle weakness (generalized): Secondary | ICD-10-CM | POA: Diagnosis not present

## 2022-11-17 DIAGNOSIS — R2681 Unsteadiness on feet: Secondary | ICD-10-CM | POA: Diagnosis not present

## 2022-11-17 DIAGNOSIS — R1311 Dysphagia, oral phase: Secondary | ICD-10-CM | POA: Diagnosis not present

## 2022-11-17 DIAGNOSIS — M6281 Muscle weakness (generalized): Secondary | ICD-10-CM | POA: Diagnosis not present

## 2022-11-17 DIAGNOSIS — C8518 Unspecified B-cell lymphoma, lymph nodes of multiple sites: Secondary | ICD-10-CM | POA: Diagnosis not present

## 2022-11-17 DIAGNOSIS — S069X0S Unspecified intracranial injury without loss of consciousness, sequela: Secondary | ICD-10-CM | POA: Diagnosis not present

## 2022-11-18 DIAGNOSIS — C8518 Unspecified B-cell lymphoma, lymph nodes of multiple sites: Secondary | ICD-10-CM | POA: Diagnosis not present

## 2022-11-18 DIAGNOSIS — R1311 Dysphagia, oral phase: Secondary | ICD-10-CM | POA: Diagnosis not present

## 2022-11-18 DIAGNOSIS — S069X0S Unspecified intracranial injury without loss of consciousness, sequela: Secondary | ICD-10-CM | POA: Diagnosis not present

## 2022-11-18 DIAGNOSIS — M6281 Muscle weakness (generalized): Secondary | ICD-10-CM | POA: Diagnosis not present

## 2022-11-18 DIAGNOSIS — R2681 Unsteadiness on feet: Secondary | ICD-10-CM | POA: Diagnosis not present

## 2022-11-20 DIAGNOSIS — S069X0S Unspecified intracranial injury without loss of consciousness, sequela: Secondary | ICD-10-CM | POA: Diagnosis not present

## 2022-11-20 DIAGNOSIS — M6281 Muscle weakness (generalized): Secondary | ICD-10-CM | POA: Diagnosis not present

## 2022-11-20 DIAGNOSIS — R2681 Unsteadiness on feet: Secondary | ICD-10-CM | POA: Diagnosis not present

## 2022-11-20 DIAGNOSIS — C8518 Unspecified B-cell lymphoma, lymph nodes of multiple sites: Secondary | ICD-10-CM | POA: Diagnosis not present

## 2022-11-20 DIAGNOSIS — R1311 Dysphagia, oral phase: Secondary | ICD-10-CM | POA: Diagnosis not present

## 2022-11-21 DIAGNOSIS — C8518 Unspecified B-cell lymphoma, lymph nodes of multiple sites: Secondary | ICD-10-CM | POA: Diagnosis not present

## 2022-11-21 DIAGNOSIS — R1311 Dysphagia, oral phase: Secondary | ICD-10-CM | POA: Diagnosis not present

## 2022-11-21 DIAGNOSIS — R2681 Unsteadiness on feet: Secondary | ICD-10-CM | POA: Diagnosis not present

## 2022-11-21 DIAGNOSIS — M6281 Muscle weakness (generalized): Secondary | ICD-10-CM | POA: Diagnosis not present

## 2022-11-21 DIAGNOSIS — S069X0S Unspecified intracranial injury without loss of consciousness, sequela: Secondary | ICD-10-CM | POA: Diagnosis not present

## 2022-11-22 DIAGNOSIS — R2681 Unsteadiness on feet: Secondary | ICD-10-CM | POA: Diagnosis not present

## 2022-11-22 DIAGNOSIS — R1311 Dysphagia, oral phase: Secondary | ICD-10-CM | POA: Diagnosis not present

## 2022-11-22 DIAGNOSIS — M6281 Muscle weakness (generalized): Secondary | ICD-10-CM | POA: Diagnosis not present

## 2022-11-22 DIAGNOSIS — S069X0S Unspecified intracranial injury without loss of consciousness, sequela: Secondary | ICD-10-CM | POA: Diagnosis not present

## 2022-11-22 DIAGNOSIS — C8518 Unspecified B-cell lymphoma, lymph nodes of multiple sites: Secondary | ICD-10-CM | POA: Diagnosis not present

## 2023-02-05 DIAGNOSIS — R2689 Other abnormalities of gait and mobility: Secondary | ICD-10-CM | POA: Diagnosis not present

## 2023-02-05 DIAGNOSIS — C8518 Unspecified B-cell lymphoma, lymph nodes of multiple sites: Secondary | ICD-10-CM | POA: Diagnosis not present

## 2023-02-05 DIAGNOSIS — M6281 Muscle weakness (generalized): Secondary | ICD-10-CM | POA: Diagnosis not present

## 2023-02-07 DIAGNOSIS — R2689 Other abnormalities of gait and mobility: Secondary | ICD-10-CM | POA: Diagnosis not present

## 2023-02-07 DIAGNOSIS — C8518 Unspecified B-cell lymphoma, lymph nodes of multiple sites: Secondary | ICD-10-CM | POA: Diagnosis not present

## 2023-02-07 DIAGNOSIS — M6281 Muscle weakness (generalized): Secondary | ICD-10-CM | POA: Diagnosis not present

## 2023-02-08 DIAGNOSIS — M6281 Muscle weakness (generalized): Secondary | ICD-10-CM | POA: Diagnosis not present

## 2023-02-08 DIAGNOSIS — C8518 Unspecified B-cell lymphoma, lymph nodes of multiple sites: Secondary | ICD-10-CM | POA: Diagnosis not present

## 2023-02-08 DIAGNOSIS — R2689 Other abnormalities of gait and mobility: Secondary | ICD-10-CM | POA: Diagnosis not present

## 2023-02-09 DIAGNOSIS — M6281 Muscle weakness (generalized): Secondary | ICD-10-CM | POA: Diagnosis not present

## 2023-02-09 DIAGNOSIS — R2689 Other abnormalities of gait and mobility: Secondary | ICD-10-CM | POA: Diagnosis not present

## 2023-02-09 DIAGNOSIS — C8518 Unspecified B-cell lymphoma, lymph nodes of multiple sites: Secondary | ICD-10-CM | POA: Diagnosis not present

## 2023-02-10 DIAGNOSIS — R2689 Other abnormalities of gait and mobility: Secondary | ICD-10-CM | POA: Diagnosis not present

## 2023-02-10 DIAGNOSIS — C8518 Unspecified B-cell lymphoma, lymph nodes of multiple sites: Secondary | ICD-10-CM | POA: Diagnosis not present

## 2023-02-10 DIAGNOSIS — M6281 Muscle weakness (generalized): Secondary | ICD-10-CM | POA: Diagnosis not present

## 2023-02-13 DIAGNOSIS — C8518 Unspecified B-cell lymphoma, lymph nodes of multiple sites: Secondary | ICD-10-CM | POA: Diagnosis not present

## 2023-02-13 DIAGNOSIS — M6281 Muscle weakness (generalized): Secondary | ICD-10-CM | POA: Diagnosis not present

## 2023-02-13 DIAGNOSIS — R2689 Other abnormalities of gait and mobility: Secondary | ICD-10-CM | POA: Diagnosis not present

## 2023-02-14 DIAGNOSIS — C8518 Unspecified B-cell lymphoma, lymph nodes of multiple sites: Secondary | ICD-10-CM | POA: Diagnosis not present

## 2023-02-14 DIAGNOSIS — M6281 Muscle weakness (generalized): Secondary | ICD-10-CM | POA: Diagnosis not present

## 2023-02-14 DIAGNOSIS — R2689 Other abnormalities of gait and mobility: Secondary | ICD-10-CM | POA: Diagnosis not present

## 2023-02-15 DIAGNOSIS — M6281 Muscle weakness (generalized): Secondary | ICD-10-CM | POA: Diagnosis not present

## 2023-02-15 DIAGNOSIS — C8518 Unspecified B-cell lymphoma, lymph nodes of multiple sites: Secondary | ICD-10-CM | POA: Diagnosis not present

## 2023-02-15 DIAGNOSIS — R2689 Other abnormalities of gait and mobility: Secondary | ICD-10-CM | POA: Diagnosis not present

## 2023-02-16 DIAGNOSIS — R2689 Other abnormalities of gait and mobility: Secondary | ICD-10-CM | POA: Diagnosis not present

## 2023-02-16 DIAGNOSIS — C8518 Unspecified B-cell lymphoma, lymph nodes of multiple sites: Secondary | ICD-10-CM | POA: Diagnosis not present

## 2023-02-16 DIAGNOSIS — M6281 Muscle weakness (generalized): Secondary | ICD-10-CM | POA: Diagnosis not present

## 2023-02-17 DIAGNOSIS — C8518 Unspecified B-cell lymphoma, lymph nodes of multiple sites: Secondary | ICD-10-CM | POA: Diagnosis not present

## 2023-02-17 DIAGNOSIS — R2689 Other abnormalities of gait and mobility: Secondary | ICD-10-CM | POA: Diagnosis not present

## 2023-02-17 DIAGNOSIS — M6281 Muscle weakness (generalized): Secondary | ICD-10-CM | POA: Diagnosis not present

## 2023-02-20 DIAGNOSIS — M6281 Muscle weakness (generalized): Secondary | ICD-10-CM | POA: Diagnosis not present

## 2023-02-20 DIAGNOSIS — C8518 Unspecified B-cell lymphoma, lymph nodes of multiple sites: Secondary | ICD-10-CM | POA: Diagnosis not present

## 2023-02-20 DIAGNOSIS — R2689 Other abnormalities of gait and mobility: Secondary | ICD-10-CM | POA: Diagnosis not present

## 2023-02-21 DIAGNOSIS — C8518 Unspecified B-cell lymphoma, lymph nodes of multiple sites: Secondary | ICD-10-CM | POA: Diagnosis not present

## 2023-02-21 DIAGNOSIS — M6281 Muscle weakness (generalized): Secondary | ICD-10-CM | POA: Diagnosis not present

## 2023-02-21 DIAGNOSIS — R2689 Other abnormalities of gait and mobility: Secondary | ICD-10-CM | POA: Diagnosis not present

## 2023-02-23 DIAGNOSIS — C8518 Unspecified B-cell lymphoma, lymph nodes of multiple sites: Secondary | ICD-10-CM | POA: Diagnosis not present

## 2023-02-23 DIAGNOSIS — R2689 Other abnormalities of gait and mobility: Secondary | ICD-10-CM | POA: Diagnosis not present

## 2023-02-23 DIAGNOSIS — M6281 Muscle weakness (generalized): Secondary | ICD-10-CM | POA: Diagnosis not present

## 2023-02-24 DIAGNOSIS — C8518 Unspecified B-cell lymphoma, lymph nodes of multiple sites: Secondary | ICD-10-CM | POA: Diagnosis not present

## 2023-02-24 DIAGNOSIS — M6281 Muscle weakness (generalized): Secondary | ICD-10-CM | POA: Diagnosis not present

## 2023-02-24 DIAGNOSIS — R2689 Other abnormalities of gait and mobility: Secondary | ICD-10-CM | POA: Diagnosis not present

## 2023-02-25 DIAGNOSIS — R2689 Other abnormalities of gait and mobility: Secondary | ICD-10-CM | POA: Diagnosis not present

## 2023-02-25 DIAGNOSIS — M6281 Muscle weakness (generalized): Secondary | ICD-10-CM | POA: Diagnosis not present

## 2023-02-25 DIAGNOSIS — C8518 Unspecified B-cell lymphoma, lymph nodes of multiple sites: Secondary | ICD-10-CM | POA: Diagnosis not present

## 2023-02-27 DIAGNOSIS — C8518 Unspecified B-cell lymphoma, lymph nodes of multiple sites: Secondary | ICD-10-CM | POA: Diagnosis not present

## 2023-02-27 DIAGNOSIS — M6281 Muscle weakness (generalized): Secondary | ICD-10-CM | POA: Diagnosis not present

## 2023-02-27 DIAGNOSIS — R2689 Other abnormalities of gait and mobility: Secondary | ICD-10-CM | POA: Diagnosis not present

## 2023-02-28 DIAGNOSIS — C8518 Unspecified B-cell lymphoma, lymph nodes of multiple sites: Secondary | ICD-10-CM | POA: Diagnosis not present

## 2023-02-28 DIAGNOSIS — M6281 Muscle weakness (generalized): Secondary | ICD-10-CM | POA: Diagnosis not present

## 2023-02-28 DIAGNOSIS — R2689 Other abnormalities of gait and mobility: Secondary | ICD-10-CM | POA: Diagnosis not present

## 2023-03-01 DIAGNOSIS — C8518 Unspecified B-cell lymphoma, lymph nodes of multiple sites: Secondary | ICD-10-CM | POA: Diagnosis not present

## 2023-03-01 DIAGNOSIS — R2689 Other abnormalities of gait and mobility: Secondary | ICD-10-CM | POA: Diagnosis not present

## 2023-03-01 DIAGNOSIS — M6281 Muscle weakness (generalized): Secondary | ICD-10-CM | POA: Diagnosis not present

## 2023-03-02 DIAGNOSIS — R2689 Other abnormalities of gait and mobility: Secondary | ICD-10-CM | POA: Diagnosis not present

## 2023-03-02 DIAGNOSIS — M6281 Muscle weakness (generalized): Secondary | ICD-10-CM | POA: Diagnosis not present

## 2023-03-02 DIAGNOSIS — C8518 Unspecified B-cell lymphoma, lymph nodes of multiple sites: Secondary | ICD-10-CM | POA: Diagnosis not present

## 2023-03-03 DIAGNOSIS — R2689 Other abnormalities of gait and mobility: Secondary | ICD-10-CM | POA: Diagnosis not present

## 2023-03-03 DIAGNOSIS — M6281 Muscle weakness (generalized): Secondary | ICD-10-CM | POA: Diagnosis not present

## 2023-03-03 DIAGNOSIS — C8518 Unspecified B-cell lymphoma, lymph nodes of multiple sites: Secondary | ICD-10-CM | POA: Diagnosis not present

## 2023-03-06 DIAGNOSIS — C8518 Unspecified B-cell lymphoma, lymph nodes of multiple sites: Secondary | ICD-10-CM | POA: Diagnosis not present

## 2023-03-06 DIAGNOSIS — M6281 Muscle weakness (generalized): Secondary | ICD-10-CM | POA: Diagnosis not present

## 2023-03-06 DIAGNOSIS — R2689 Other abnormalities of gait and mobility: Secondary | ICD-10-CM | POA: Diagnosis not present

## 2023-03-07 DIAGNOSIS — M6281 Muscle weakness (generalized): Secondary | ICD-10-CM | POA: Diagnosis not present

## 2023-03-07 DIAGNOSIS — R2689 Other abnormalities of gait and mobility: Secondary | ICD-10-CM | POA: Diagnosis not present

## 2023-03-07 DIAGNOSIS — C8518 Unspecified B-cell lymphoma, lymph nodes of multiple sites: Secondary | ICD-10-CM | POA: Diagnosis not present

## 2023-03-08 DIAGNOSIS — C8518 Unspecified B-cell lymphoma, lymph nodes of multiple sites: Secondary | ICD-10-CM | POA: Diagnosis not present

## 2023-03-08 DIAGNOSIS — M6281 Muscle weakness (generalized): Secondary | ICD-10-CM | POA: Diagnosis not present

## 2023-03-08 DIAGNOSIS — R2689 Other abnormalities of gait and mobility: Secondary | ICD-10-CM | POA: Diagnosis not present

## 2023-03-09 DIAGNOSIS — R2689 Other abnormalities of gait and mobility: Secondary | ICD-10-CM | POA: Diagnosis not present

## 2023-03-09 DIAGNOSIS — M6281 Muscle weakness (generalized): Secondary | ICD-10-CM | POA: Diagnosis not present

## 2023-03-09 DIAGNOSIS — C8518 Unspecified B-cell lymphoma, lymph nodes of multiple sites: Secondary | ICD-10-CM | POA: Diagnosis not present

## 2023-03-10 DIAGNOSIS — C8518 Unspecified B-cell lymphoma, lymph nodes of multiple sites: Secondary | ICD-10-CM | POA: Diagnosis not present

## 2023-03-10 DIAGNOSIS — R2689 Other abnormalities of gait and mobility: Secondary | ICD-10-CM | POA: Diagnosis not present

## 2023-03-10 DIAGNOSIS — M6281 Muscle weakness (generalized): Secondary | ICD-10-CM | POA: Diagnosis not present

## 2023-03-13 DIAGNOSIS — R2689 Other abnormalities of gait and mobility: Secondary | ICD-10-CM | POA: Diagnosis not present

## 2023-03-13 DIAGNOSIS — M6281 Muscle weakness (generalized): Secondary | ICD-10-CM | POA: Diagnosis not present

## 2023-03-13 DIAGNOSIS — C8518 Unspecified B-cell lymphoma, lymph nodes of multiple sites: Secondary | ICD-10-CM | POA: Diagnosis not present

## 2023-03-14 DIAGNOSIS — R2689 Other abnormalities of gait and mobility: Secondary | ICD-10-CM | POA: Diagnosis not present

## 2023-03-14 DIAGNOSIS — C8518 Unspecified B-cell lymphoma, lymph nodes of multiple sites: Secondary | ICD-10-CM | POA: Diagnosis not present

## 2023-03-14 DIAGNOSIS — M6281 Muscle weakness (generalized): Secondary | ICD-10-CM | POA: Diagnosis not present

## 2023-04-10 DIAGNOSIS — U099 Post covid-19 condition, unspecified: Secondary | ICD-10-CM | POA: Diagnosis not present

## 2023-04-10 DIAGNOSIS — R2681 Unsteadiness on feet: Secondary | ICD-10-CM | POA: Diagnosis not present

## 2023-04-10 DIAGNOSIS — S069X0S Unspecified intracranial injury without loss of consciousness, sequela: Secondary | ICD-10-CM | POA: Diagnosis not present

## 2023-04-10 DIAGNOSIS — M6281 Muscle weakness (generalized): Secondary | ICD-10-CM | POA: Diagnosis not present

## 2023-04-11 DIAGNOSIS — R2681 Unsteadiness on feet: Secondary | ICD-10-CM | POA: Diagnosis not present

## 2023-04-11 DIAGNOSIS — M6281 Muscle weakness (generalized): Secondary | ICD-10-CM | POA: Diagnosis not present

## 2023-04-11 DIAGNOSIS — U099 Post covid-19 condition, unspecified: Secondary | ICD-10-CM | POA: Diagnosis not present

## 2023-04-11 DIAGNOSIS — S069X0S Unspecified intracranial injury without loss of consciousness, sequela: Secondary | ICD-10-CM | POA: Diagnosis not present

## 2023-04-12 DIAGNOSIS — U099 Post covid-19 condition, unspecified: Secondary | ICD-10-CM | POA: Diagnosis not present

## 2023-04-12 DIAGNOSIS — R2681 Unsteadiness on feet: Secondary | ICD-10-CM | POA: Diagnosis not present

## 2023-04-12 DIAGNOSIS — S069X0S Unspecified intracranial injury without loss of consciousness, sequela: Secondary | ICD-10-CM | POA: Diagnosis not present

## 2023-04-12 DIAGNOSIS — M6281 Muscle weakness (generalized): Secondary | ICD-10-CM | POA: Diagnosis not present

## 2023-04-13 DIAGNOSIS — R2681 Unsteadiness on feet: Secondary | ICD-10-CM | POA: Diagnosis not present

## 2023-04-13 DIAGNOSIS — S069X0S Unspecified intracranial injury without loss of consciousness, sequela: Secondary | ICD-10-CM | POA: Diagnosis not present

## 2023-04-13 DIAGNOSIS — U099 Post covid-19 condition, unspecified: Secondary | ICD-10-CM | POA: Diagnosis not present

## 2023-04-13 DIAGNOSIS — M6281 Muscle weakness (generalized): Secondary | ICD-10-CM | POA: Diagnosis not present

## 2023-04-14 DIAGNOSIS — S069X0S Unspecified intracranial injury without loss of consciousness, sequela: Secondary | ICD-10-CM | POA: Diagnosis not present

## 2023-04-14 DIAGNOSIS — M6281 Muscle weakness (generalized): Secondary | ICD-10-CM | POA: Diagnosis not present

## 2023-04-14 DIAGNOSIS — R2681 Unsteadiness on feet: Secondary | ICD-10-CM | POA: Diagnosis not present

## 2023-04-14 DIAGNOSIS — U099 Post covid-19 condition, unspecified: Secondary | ICD-10-CM | POA: Diagnosis not present

## 2023-04-17 DIAGNOSIS — U099 Post covid-19 condition, unspecified: Secondary | ICD-10-CM | POA: Diagnosis not present

## 2023-04-17 DIAGNOSIS — S069X0S Unspecified intracranial injury without loss of consciousness, sequela: Secondary | ICD-10-CM | POA: Diagnosis not present

## 2023-04-17 DIAGNOSIS — M6281 Muscle weakness (generalized): Secondary | ICD-10-CM | POA: Diagnosis not present

## 2023-04-17 DIAGNOSIS — R2681 Unsteadiness on feet: Secondary | ICD-10-CM | POA: Diagnosis not present

## 2023-04-18 DIAGNOSIS — S069X0S Unspecified intracranial injury without loss of consciousness, sequela: Secondary | ICD-10-CM | POA: Diagnosis not present

## 2023-04-18 DIAGNOSIS — M6281 Muscle weakness (generalized): Secondary | ICD-10-CM | POA: Diagnosis not present

## 2023-04-18 DIAGNOSIS — U099 Post covid-19 condition, unspecified: Secondary | ICD-10-CM | POA: Diagnosis not present

## 2023-04-18 DIAGNOSIS — R2681 Unsteadiness on feet: Secondary | ICD-10-CM | POA: Diagnosis not present

## 2023-04-19 DIAGNOSIS — S069X0S Unspecified intracranial injury without loss of consciousness, sequela: Secondary | ICD-10-CM | POA: Diagnosis not present

## 2023-04-19 DIAGNOSIS — R2681 Unsteadiness on feet: Secondary | ICD-10-CM | POA: Diagnosis not present

## 2023-04-19 DIAGNOSIS — U099 Post covid-19 condition, unspecified: Secondary | ICD-10-CM | POA: Diagnosis not present

## 2023-04-19 DIAGNOSIS — M6281 Muscle weakness (generalized): Secondary | ICD-10-CM | POA: Diagnosis not present

## 2023-04-20 DIAGNOSIS — M6281 Muscle weakness (generalized): Secondary | ICD-10-CM | POA: Diagnosis not present

## 2023-04-20 DIAGNOSIS — S069X0S Unspecified intracranial injury without loss of consciousness, sequela: Secondary | ICD-10-CM | POA: Diagnosis not present

## 2023-04-20 DIAGNOSIS — R2681 Unsteadiness on feet: Secondary | ICD-10-CM | POA: Diagnosis not present

## 2023-04-20 DIAGNOSIS — U099 Post covid-19 condition, unspecified: Secondary | ICD-10-CM | POA: Diagnosis not present

## 2023-04-21 DIAGNOSIS — M6281 Muscle weakness (generalized): Secondary | ICD-10-CM | POA: Diagnosis not present

## 2023-04-21 DIAGNOSIS — S069X0S Unspecified intracranial injury without loss of consciousness, sequela: Secondary | ICD-10-CM | POA: Diagnosis not present

## 2023-04-21 DIAGNOSIS — U099 Post covid-19 condition, unspecified: Secondary | ICD-10-CM | POA: Diagnosis not present

## 2023-04-21 DIAGNOSIS — R2681 Unsteadiness on feet: Secondary | ICD-10-CM | POA: Diagnosis not present

## 2023-04-25 DIAGNOSIS — U099 Post covid-19 condition, unspecified: Secondary | ICD-10-CM | POA: Diagnosis not present

## 2023-04-25 DIAGNOSIS — M6281 Muscle weakness (generalized): Secondary | ICD-10-CM | POA: Diagnosis not present

## 2023-04-25 DIAGNOSIS — R2681 Unsteadiness on feet: Secondary | ICD-10-CM | POA: Diagnosis not present

## 2023-04-25 DIAGNOSIS — Z87820 Personal history of traumatic brain injury: Secondary | ICD-10-CM | POA: Diagnosis not present

## 2023-04-25 DIAGNOSIS — Z741 Need for assistance with personal care: Secondary | ICD-10-CM | POA: Diagnosis not present

## 2023-04-25 DIAGNOSIS — S069X0S Unspecified intracranial injury without loss of consciousness, sequela: Secondary | ICD-10-CM | POA: Diagnosis not present

## 2023-04-26 DIAGNOSIS — U099 Post covid-19 condition, unspecified: Secondary | ICD-10-CM | POA: Diagnosis not present

## 2023-04-26 DIAGNOSIS — R2681 Unsteadiness on feet: Secondary | ICD-10-CM | POA: Diagnosis not present

## 2023-04-26 DIAGNOSIS — Z741 Need for assistance with personal care: Secondary | ICD-10-CM | POA: Diagnosis not present

## 2023-04-26 DIAGNOSIS — Z87820 Personal history of traumatic brain injury: Secondary | ICD-10-CM | POA: Diagnosis not present

## 2023-04-26 DIAGNOSIS — S069X0S Unspecified intracranial injury without loss of consciousness, sequela: Secondary | ICD-10-CM | POA: Diagnosis not present

## 2023-04-26 DIAGNOSIS — M6281 Muscle weakness (generalized): Secondary | ICD-10-CM | POA: Diagnosis not present

## 2023-04-27 DIAGNOSIS — R2681 Unsteadiness on feet: Secondary | ICD-10-CM | POA: Diagnosis not present

## 2023-04-27 DIAGNOSIS — U099 Post covid-19 condition, unspecified: Secondary | ICD-10-CM | POA: Diagnosis not present

## 2023-04-27 DIAGNOSIS — Z741 Need for assistance with personal care: Secondary | ICD-10-CM | POA: Diagnosis not present

## 2023-04-27 DIAGNOSIS — M6281 Muscle weakness (generalized): Secondary | ICD-10-CM | POA: Diagnosis not present

## 2023-04-27 DIAGNOSIS — S069X0S Unspecified intracranial injury without loss of consciousness, sequela: Secondary | ICD-10-CM | POA: Diagnosis not present

## 2023-04-27 DIAGNOSIS — Z87820 Personal history of traumatic brain injury: Secondary | ICD-10-CM | POA: Diagnosis not present

## 2023-04-28 DIAGNOSIS — S069X0S Unspecified intracranial injury without loss of consciousness, sequela: Secondary | ICD-10-CM | POA: Diagnosis not present

## 2023-04-28 DIAGNOSIS — U099 Post covid-19 condition, unspecified: Secondary | ICD-10-CM | POA: Diagnosis not present

## 2023-04-28 DIAGNOSIS — Z741 Need for assistance with personal care: Secondary | ICD-10-CM | POA: Diagnosis not present

## 2023-04-28 DIAGNOSIS — M6281 Muscle weakness (generalized): Secondary | ICD-10-CM | POA: Diagnosis not present

## 2023-04-28 DIAGNOSIS — Z87820 Personal history of traumatic brain injury: Secondary | ICD-10-CM | POA: Diagnosis not present

## 2023-04-28 DIAGNOSIS — R2681 Unsteadiness on feet: Secondary | ICD-10-CM | POA: Diagnosis not present

## 2023-04-29 DIAGNOSIS — R2681 Unsteadiness on feet: Secondary | ICD-10-CM | POA: Diagnosis not present

## 2023-04-29 DIAGNOSIS — S069X0S Unspecified intracranial injury without loss of consciousness, sequela: Secondary | ICD-10-CM | POA: Diagnosis not present

## 2023-04-29 DIAGNOSIS — Z87820 Personal history of traumatic brain injury: Secondary | ICD-10-CM | POA: Diagnosis not present

## 2023-04-29 DIAGNOSIS — M6281 Muscle weakness (generalized): Secondary | ICD-10-CM | POA: Diagnosis not present

## 2023-04-29 DIAGNOSIS — U099 Post covid-19 condition, unspecified: Secondary | ICD-10-CM | POA: Diagnosis not present

## 2023-04-29 DIAGNOSIS — Z741 Need for assistance with personal care: Secondary | ICD-10-CM | POA: Diagnosis not present

## 2023-05-01 DIAGNOSIS — U099 Post covid-19 condition, unspecified: Secondary | ICD-10-CM | POA: Diagnosis not present

## 2023-05-01 DIAGNOSIS — Z741 Need for assistance with personal care: Secondary | ICD-10-CM | POA: Diagnosis not present

## 2023-05-01 DIAGNOSIS — Z87820 Personal history of traumatic brain injury: Secondary | ICD-10-CM | POA: Diagnosis not present

## 2023-05-01 DIAGNOSIS — R2681 Unsteadiness on feet: Secondary | ICD-10-CM | POA: Diagnosis not present

## 2023-05-01 DIAGNOSIS — M6281 Muscle weakness (generalized): Secondary | ICD-10-CM | POA: Diagnosis not present

## 2023-05-01 DIAGNOSIS — S069X0S Unspecified intracranial injury without loss of consciousness, sequela: Secondary | ICD-10-CM | POA: Diagnosis not present

## 2023-05-02 DIAGNOSIS — M6281 Muscle weakness (generalized): Secondary | ICD-10-CM | POA: Diagnosis not present

## 2023-05-02 DIAGNOSIS — S069X0S Unspecified intracranial injury without loss of consciousness, sequela: Secondary | ICD-10-CM | POA: Diagnosis not present

## 2023-05-02 DIAGNOSIS — U099 Post covid-19 condition, unspecified: Secondary | ICD-10-CM | POA: Diagnosis not present

## 2023-05-02 DIAGNOSIS — R2681 Unsteadiness on feet: Secondary | ICD-10-CM | POA: Diagnosis not present

## 2023-05-02 DIAGNOSIS — Z87820 Personal history of traumatic brain injury: Secondary | ICD-10-CM | POA: Diagnosis not present

## 2023-05-02 DIAGNOSIS — Z741 Need for assistance with personal care: Secondary | ICD-10-CM | POA: Diagnosis not present

## 2023-05-03 DIAGNOSIS — S069X0S Unspecified intracranial injury without loss of consciousness, sequela: Secondary | ICD-10-CM | POA: Diagnosis not present

## 2023-05-03 DIAGNOSIS — M6281 Muscle weakness (generalized): Secondary | ICD-10-CM | POA: Diagnosis not present

## 2023-05-03 DIAGNOSIS — U099 Post covid-19 condition, unspecified: Secondary | ICD-10-CM | POA: Diagnosis not present

## 2023-05-03 DIAGNOSIS — Z741 Need for assistance with personal care: Secondary | ICD-10-CM | POA: Diagnosis not present

## 2023-05-03 DIAGNOSIS — Z87820 Personal history of traumatic brain injury: Secondary | ICD-10-CM | POA: Diagnosis not present

## 2023-05-03 DIAGNOSIS — R2681 Unsteadiness on feet: Secondary | ICD-10-CM | POA: Diagnosis not present

## 2023-05-04 DIAGNOSIS — Z87820 Personal history of traumatic brain injury: Secondary | ICD-10-CM | POA: Diagnosis not present

## 2023-05-04 DIAGNOSIS — Z741 Need for assistance with personal care: Secondary | ICD-10-CM | POA: Diagnosis not present

## 2023-05-04 DIAGNOSIS — U099 Post covid-19 condition, unspecified: Secondary | ICD-10-CM | POA: Diagnosis not present

## 2023-05-04 DIAGNOSIS — R2681 Unsteadiness on feet: Secondary | ICD-10-CM | POA: Diagnosis not present

## 2023-05-04 DIAGNOSIS — S069X0S Unspecified intracranial injury without loss of consciousness, sequela: Secondary | ICD-10-CM | POA: Diagnosis not present

## 2023-05-04 DIAGNOSIS — M6281 Muscle weakness (generalized): Secondary | ICD-10-CM | POA: Diagnosis not present

## 2023-05-05 DIAGNOSIS — Z87820 Personal history of traumatic brain injury: Secondary | ICD-10-CM | POA: Diagnosis not present

## 2023-05-05 DIAGNOSIS — R2681 Unsteadiness on feet: Secondary | ICD-10-CM | POA: Diagnosis not present

## 2023-05-05 DIAGNOSIS — S069X0S Unspecified intracranial injury without loss of consciousness, sequela: Secondary | ICD-10-CM | POA: Diagnosis not present

## 2023-05-05 DIAGNOSIS — U099 Post covid-19 condition, unspecified: Secondary | ICD-10-CM | POA: Diagnosis not present

## 2023-05-05 DIAGNOSIS — Z741 Need for assistance with personal care: Secondary | ICD-10-CM | POA: Diagnosis not present

## 2023-05-05 DIAGNOSIS — M6281 Muscle weakness (generalized): Secondary | ICD-10-CM | POA: Diagnosis not present

## 2023-05-08 DIAGNOSIS — Z741 Need for assistance with personal care: Secondary | ICD-10-CM | POA: Diagnosis not present

## 2023-05-08 DIAGNOSIS — Z87820 Personal history of traumatic brain injury: Secondary | ICD-10-CM | POA: Diagnosis not present

## 2023-05-08 DIAGNOSIS — R2681 Unsteadiness on feet: Secondary | ICD-10-CM | POA: Diagnosis not present

## 2023-05-08 DIAGNOSIS — U099 Post covid-19 condition, unspecified: Secondary | ICD-10-CM | POA: Diagnosis not present

## 2023-05-08 DIAGNOSIS — M6281 Muscle weakness (generalized): Secondary | ICD-10-CM | POA: Diagnosis not present

## 2023-05-08 DIAGNOSIS — S069X0S Unspecified intracranial injury without loss of consciousness, sequela: Secondary | ICD-10-CM | POA: Diagnosis not present

## 2023-05-09 DIAGNOSIS — Z741 Need for assistance with personal care: Secondary | ICD-10-CM | POA: Diagnosis not present

## 2023-05-09 DIAGNOSIS — M6281 Muscle weakness (generalized): Secondary | ICD-10-CM | POA: Diagnosis not present

## 2023-05-09 DIAGNOSIS — Z87820 Personal history of traumatic brain injury: Secondary | ICD-10-CM | POA: Diagnosis not present

## 2023-05-09 DIAGNOSIS — S069X0S Unspecified intracranial injury without loss of consciousness, sequela: Secondary | ICD-10-CM | POA: Diagnosis not present

## 2023-05-09 DIAGNOSIS — U099 Post covid-19 condition, unspecified: Secondary | ICD-10-CM | POA: Diagnosis not present

## 2023-05-09 DIAGNOSIS — R2681 Unsteadiness on feet: Secondary | ICD-10-CM | POA: Diagnosis not present

## 2023-05-10 DIAGNOSIS — Z741 Need for assistance with personal care: Secondary | ICD-10-CM | POA: Diagnosis not present

## 2023-05-10 DIAGNOSIS — U099 Post covid-19 condition, unspecified: Secondary | ICD-10-CM | POA: Diagnosis not present

## 2023-05-10 DIAGNOSIS — R2681 Unsteadiness on feet: Secondary | ICD-10-CM | POA: Diagnosis not present

## 2023-05-10 DIAGNOSIS — M6281 Muscle weakness (generalized): Secondary | ICD-10-CM | POA: Diagnosis not present

## 2023-05-10 DIAGNOSIS — S069X0S Unspecified intracranial injury without loss of consciousness, sequela: Secondary | ICD-10-CM | POA: Diagnosis not present

## 2023-05-10 DIAGNOSIS — Z87820 Personal history of traumatic brain injury: Secondary | ICD-10-CM | POA: Diagnosis not present

## 2023-05-11 DIAGNOSIS — U099 Post covid-19 condition, unspecified: Secondary | ICD-10-CM | POA: Diagnosis not present

## 2023-05-11 DIAGNOSIS — Z87820 Personal history of traumatic brain injury: Secondary | ICD-10-CM | POA: Diagnosis not present

## 2023-05-11 DIAGNOSIS — S069X0S Unspecified intracranial injury without loss of consciousness, sequela: Secondary | ICD-10-CM | POA: Diagnosis not present

## 2023-05-11 DIAGNOSIS — R2681 Unsteadiness on feet: Secondary | ICD-10-CM | POA: Diagnosis not present

## 2023-05-11 DIAGNOSIS — Z741 Need for assistance with personal care: Secondary | ICD-10-CM | POA: Diagnosis not present

## 2023-05-11 DIAGNOSIS — M6281 Muscle weakness (generalized): Secondary | ICD-10-CM | POA: Diagnosis not present

## 2023-05-12 DIAGNOSIS — U099 Post covid-19 condition, unspecified: Secondary | ICD-10-CM | POA: Diagnosis not present

## 2023-05-12 DIAGNOSIS — Z741 Need for assistance with personal care: Secondary | ICD-10-CM | POA: Diagnosis not present

## 2023-05-12 DIAGNOSIS — R2681 Unsteadiness on feet: Secondary | ICD-10-CM | POA: Diagnosis not present

## 2023-05-12 DIAGNOSIS — M6281 Muscle weakness (generalized): Secondary | ICD-10-CM | POA: Diagnosis not present

## 2023-05-12 DIAGNOSIS — Z87820 Personal history of traumatic brain injury: Secondary | ICD-10-CM | POA: Diagnosis not present

## 2023-05-12 DIAGNOSIS — Z23 Encounter for immunization: Secondary | ICD-10-CM | POA: Diagnosis not present

## 2023-05-12 DIAGNOSIS — S069X0S Unspecified intracranial injury without loss of consciousness, sequela: Secondary | ICD-10-CM | POA: Diagnosis not present

## 2023-05-13 DIAGNOSIS — S069X0S Unspecified intracranial injury without loss of consciousness, sequela: Secondary | ICD-10-CM | POA: Diagnosis not present

## 2023-05-13 DIAGNOSIS — R2681 Unsteadiness on feet: Secondary | ICD-10-CM | POA: Diagnosis not present

## 2023-05-13 DIAGNOSIS — U099 Post covid-19 condition, unspecified: Secondary | ICD-10-CM | POA: Diagnosis not present

## 2023-05-13 DIAGNOSIS — Z741 Need for assistance with personal care: Secondary | ICD-10-CM | POA: Diagnosis not present

## 2023-05-13 DIAGNOSIS — Z87820 Personal history of traumatic brain injury: Secondary | ICD-10-CM | POA: Diagnosis not present

## 2023-05-13 DIAGNOSIS — M6281 Muscle weakness (generalized): Secondary | ICD-10-CM | POA: Diagnosis not present

## 2023-05-15 DIAGNOSIS — Z741 Need for assistance with personal care: Secondary | ICD-10-CM | POA: Diagnosis not present

## 2023-05-15 DIAGNOSIS — U099 Post covid-19 condition, unspecified: Secondary | ICD-10-CM | POA: Diagnosis not present

## 2023-05-15 DIAGNOSIS — R2681 Unsteadiness on feet: Secondary | ICD-10-CM | POA: Diagnosis not present

## 2023-05-15 DIAGNOSIS — S069X0S Unspecified intracranial injury without loss of consciousness, sequela: Secondary | ICD-10-CM | POA: Diagnosis not present

## 2023-05-15 DIAGNOSIS — Z87820 Personal history of traumatic brain injury: Secondary | ICD-10-CM | POA: Diagnosis not present

## 2023-05-15 DIAGNOSIS — M6281 Muscle weakness (generalized): Secondary | ICD-10-CM | POA: Diagnosis not present

## 2023-05-17 DIAGNOSIS — U099 Post covid-19 condition, unspecified: Secondary | ICD-10-CM | POA: Diagnosis not present

## 2023-05-17 DIAGNOSIS — Z87820 Personal history of traumatic brain injury: Secondary | ICD-10-CM | POA: Diagnosis not present

## 2023-05-17 DIAGNOSIS — M6281 Muscle weakness (generalized): Secondary | ICD-10-CM | POA: Diagnosis not present

## 2023-05-17 DIAGNOSIS — R2681 Unsteadiness on feet: Secondary | ICD-10-CM | POA: Diagnosis not present

## 2023-05-17 DIAGNOSIS — S069X0S Unspecified intracranial injury without loss of consciousness, sequela: Secondary | ICD-10-CM | POA: Diagnosis not present

## 2023-05-17 DIAGNOSIS — Z741 Need for assistance with personal care: Secondary | ICD-10-CM | POA: Diagnosis not present

## 2023-05-18 DIAGNOSIS — M6281 Muscle weakness (generalized): Secondary | ICD-10-CM | POA: Diagnosis not present

## 2023-05-18 DIAGNOSIS — Z87820 Personal history of traumatic brain injury: Secondary | ICD-10-CM | POA: Diagnosis not present

## 2023-05-18 DIAGNOSIS — R2681 Unsteadiness on feet: Secondary | ICD-10-CM | POA: Diagnosis not present

## 2023-05-18 DIAGNOSIS — S069X0S Unspecified intracranial injury without loss of consciousness, sequela: Secondary | ICD-10-CM | POA: Diagnosis not present

## 2023-05-18 DIAGNOSIS — U099 Post covid-19 condition, unspecified: Secondary | ICD-10-CM | POA: Diagnosis not present

## 2023-05-18 DIAGNOSIS — Z741 Need for assistance with personal care: Secondary | ICD-10-CM | POA: Diagnosis not present

## 2023-05-20 DIAGNOSIS — S069X0S Unspecified intracranial injury without loss of consciousness, sequela: Secondary | ICD-10-CM | POA: Diagnosis not present

## 2023-05-20 DIAGNOSIS — R2681 Unsteadiness on feet: Secondary | ICD-10-CM | POA: Diagnosis not present

## 2023-05-20 DIAGNOSIS — Z741 Need for assistance with personal care: Secondary | ICD-10-CM | POA: Diagnosis not present

## 2023-05-20 DIAGNOSIS — M6281 Muscle weakness (generalized): Secondary | ICD-10-CM | POA: Diagnosis not present

## 2023-05-20 DIAGNOSIS — U099 Post covid-19 condition, unspecified: Secondary | ICD-10-CM | POA: Diagnosis not present

## 2023-05-20 DIAGNOSIS — Z87820 Personal history of traumatic brain injury: Secondary | ICD-10-CM | POA: Diagnosis not present

## 2023-05-22 DIAGNOSIS — R2681 Unsteadiness on feet: Secondary | ICD-10-CM | POA: Diagnosis not present

## 2023-05-22 DIAGNOSIS — Z741 Need for assistance with personal care: Secondary | ICD-10-CM | POA: Diagnosis not present

## 2023-05-22 DIAGNOSIS — M6281 Muscle weakness (generalized): Secondary | ICD-10-CM | POA: Diagnosis not present

## 2023-05-22 DIAGNOSIS — S069X0S Unspecified intracranial injury without loss of consciousness, sequela: Secondary | ICD-10-CM | POA: Diagnosis not present

## 2023-05-22 DIAGNOSIS — U099 Post covid-19 condition, unspecified: Secondary | ICD-10-CM | POA: Diagnosis not present

## 2023-05-22 DIAGNOSIS — Z87820 Personal history of traumatic brain injury: Secondary | ICD-10-CM | POA: Diagnosis not present

## 2023-05-23 DIAGNOSIS — M6281 Muscle weakness (generalized): Secondary | ICD-10-CM | POA: Diagnosis not present

## 2023-05-23 DIAGNOSIS — S069X0S Unspecified intracranial injury without loss of consciousness, sequela: Secondary | ICD-10-CM | POA: Diagnosis not present

## 2023-05-23 DIAGNOSIS — Z741 Need for assistance with personal care: Secondary | ICD-10-CM | POA: Diagnosis not present

## 2023-05-23 DIAGNOSIS — Z87820 Personal history of traumatic brain injury: Secondary | ICD-10-CM | POA: Diagnosis not present

## 2023-05-23 DIAGNOSIS — U099 Post covid-19 condition, unspecified: Secondary | ICD-10-CM | POA: Diagnosis not present

## 2023-05-23 DIAGNOSIS — R2681 Unsteadiness on feet: Secondary | ICD-10-CM | POA: Diagnosis not present

## 2023-05-24 DIAGNOSIS — U099 Post covid-19 condition, unspecified: Secondary | ICD-10-CM | POA: Diagnosis not present

## 2023-05-24 DIAGNOSIS — M6281 Muscle weakness (generalized): Secondary | ICD-10-CM | POA: Diagnosis not present

## 2023-05-24 DIAGNOSIS — R2681 Unsteadiness on feet: Secondary | ICD-10-CM | POA: Diagnosis not present

## 2023-05-24 DIAGNOSIS — S069X0S Unspecified intracranial injury without loss of consciousness, sequela: Secondary | ICD-10-CM | POA: Diagnosis not present

## 2023-05-24 DIAGNOSIS — Z741 Need for assistance with personal care: Secondary | ICD-10-CM | POA: Diagnosis not present

## 2023-05-24 DIAGNOSIS — Z87820 Personal history of traumatic brain injury: Secondary | ICD-10-CM | POA: Diagnosis not present

## 2023-08-05 ENCOUNTER — Emergency Department (HOSPITAL_COMMUNITY)
Admission: EM | Admit: 2023-08-05 | Discharge: 2023-08-06 | Disposition: A | Discharge status: Skilled Nursing Facility | Payer: Medicare HMO | Attending: Student | Admitting: Student

## 2023-08-05 ENCOUNTER — Encounter (HOSPITAL_COMMUNITY): Payer: Self-pay

## 2023-08-05 ENCOUNTER — Other Ambulatory Visit: Payer: Self-pay

## 2023-08-05 DIAGNOSIS — I447 Left bundle-branch block, unspecified: Secondary | ICD-10-CM | POA: Diagnosis not present

## 2023-08-05 DIAGNOSIS — Z87820 Personal history of traumatic brain injury: Secondary | ICD-10-CM | POA: Insufficient documentation

## 2023-08-05 DIAGNOSIS — Z20822 Contact with and (suspected) exposure to covid-19: Secondary | ICD-10-CM | POA: Insufficient documentation

## 2023-08-05 DIAGNOSIS — I951 Orthostatic hypotension: Secondary | ICD-10-CM | POA: Diagnosis not present

## 2023-08-05 DIAGNOSIS — I471 Supraventricular tachycardia, unspecified: Secondary | ICD-10-CM | POA: Insufficient documentation

## 2023-08-05 DIAGNOSIS — R404 Transient alteration of awareness: Secondary | ICD-10-CM | POA: Insufficient documentation

## 2023-08-05 DIAGNOSIS — R000 Tachycardia, unspecified: Secondary | ICD-10-CM | POA: Diagnosis present

## 2023-08-05 LAB — RESP PANEL BY RT-PCR (RSV, FLU A&B, COVID)  RVPGX2
Influenza A by PCR: NEGATIVE
Influenza B by PCR: NEGATIVE
Resp Syncytial Virus by PCR: NEGATIVE
SARS Coronavirus 2 by RT PCR: NEGATIVE

## 2023-08-05 LAB — COMPREHENSIVE METABOLIC PANEL
ALT: 13 U/L (ref 0–44)
AST: 16 U/L (ref 15–41)
Albumin: 3.4 g/dL — ABNORMAL LOW (ref 3.5–5.0)
Alkaline Phosphatase: 82 U/L (ref 38–126)
Anion gap: 9 (ref 5–15)
BUN: 19 mg/dL (ref 8–23)
CO2: 23 mmol/L (ref 22–32)
Calcium: 8.8 mg/dL — ABNORMAL LOW (ref 8.9–10.3)
Chloride: 102 mmol/L (ref 98–111)
Creatinine, Ser: 1.15 mg/dL (ref 0.61–1.24)
GFR, Estimated: >60 mL/min (ref >60)
Glucose, Bld: 119 mg/dL — ABNORMAL HIGH (ref 70–99)
Potassium: 4.8 mmol/L (ref 3.5–5.1)
Sodium: 134 mmol/L — ABNORMAL LOW (ref 135–145)
Total Bilirubin: 0.6 mg/dL (ref 0.0–1.2)
Total Protein: 6.3 g/dL — ABNORMAL LOW (ref 6.5–8.1)

## 2023-08-05 LAB — CBC WITH DIFFERENTIAL/PLATELET
Abs Immature Granulocytes: 0.04 10*3/uL (ref 0.00–0.07)
Basophils Absolute: 0 10*3/uL (ref 0.0–0.1)
Basophils Relative: 0 %
Eosinophils Absolute: 0.1 10*3/uL (ref 0.0–0.5)
Eosinophils Relative: 1 %
HCT: 44.4 % (ref 39.0–52.0)
Hemoglobin: 15 g/dL (ref 13.0–17.0)
Immature Granulocytes: 0 %
Lymphocytes Relative: 9 %
Lymphs Abs: 0.9 10*3/uL (ref 0.7–4.0)
MCH: 36.6 pg — ABNORMAL HIGH (ref 26.0–34.0)
MCHC: 33.8 g/dL (ref 30.0–36.0)
MCV: 108.3 fL — ABNORMAL HIGH (ref 80.0–100.0)
Monocytes Absolute: 1 10*3/uL (ref 0.1–1.0)
Monocytes Relative: 10 %
Neutro Abs: 7.7 10*3/uL (ref 1.7–7.7)
Neutrophils Relative %: 80 %
Platelets: 243 10*3/uL (ref 150–400)
RBC: 4.1 MIL/uL — ABNORMAL LOW (ref 4.22–5.81)
RDW: 14.3 % (ref 11.5–15.5)
WBC: 9.7 10*3/uL (ref 4.0–10.5)
nRBC: 0 % (ref 0.0–0.2)

## 2023-08-05 LAB — MAGNESIUM: Magnesium: 2 mg/dL (ref 1.7–2.4)

## 2023-08-05 LAB — TROPONIN I (HIGH SENSITIVITY): Troponin I (High Sensitivity): 9 ng/L (ref <18)

## 2023-08-05 MED ORDER — LACTATED RINGERS IV BOLUS
1000.0000 mL | Freq: Once | INTRAVENOUS | Status: AC
  Administered 2023-08-05: 1000 mL via INTRAVENOUS

## 2023-08-05 MED ORDER — ADENOSINE 6 MG/2ML IV SOLN
INTRAVENOUS | Status: AC
  Filled 2023-08-05: qty 6

## 2023-08-05 MED ORDER — ADENOSINE 6 MG/2ML IV SOLN: 6.0000 mg | Freq: Once | INTRAVENOUS | Status: DC

## 2023-08-05 NOTE — Discharge Instructions (Addendum)
 You have been seen here in the emergency department for concern for low blood pressure, we repleted your fluid.  We checked blood work and your heart, everything is looking good, we feel you are safe to go back to your facility  We have obtained a full history, performed a physical exam, in addition to other diagnostic tests and treatments. Right now, we feel that you are safe for discharge from a medical perspective, and do not have an acute life threatening illness.   To do: 1.) Take all medications as prescribed.   2.) If anything changes, or you develop fevers, chills, inability to eat or drink, severe pain, new symptoms, return of symptoms, worsening of symptoms, or any other concerns, please call 911 or come back to the emergency department as soon as possible.   3.) Please make an appointment with your primary care doctor for a follow-up visit after being seen here in the emergency department.   Thank you for allowing me to take care of you today. We hope that you feel better soon.
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
## 2023-08-05 NOTE — ED Notes (Signed)
 Ptar called

## 2023-08-05 NOTE — ED Provider Notes (Signed)
 Patient was discharged, waiting for transportation, when he went into a rapid rhythm.  Monitor showed supraventricular tachycardia, per my interpretation.  I reviewed his electrocardiogram and my interpretation was supraventricular tachycardia with occasional PV, changed from sinus rhythm and ECG earlier today.  Patient was in no distress, denied being aware of his heart racing.  On exam, lungs are clear and heart is tachycardic.  I performed carotid sinus massage on the left carotid artery with no change in rhythm, performed carotid sinus massage on the right carotid artery with successful conversion to sinus rhythm.  He will need to be observed to make sure that the rhythm is stable.   EKG Interpretation Date/Time:  Saturday August 05 2023 23:46:51 EST Ventricular Rate:  167 PR Interval:  167 QRS Duration:  117 QT Interval:  292 QTC Calculation: 487 R Axis:   -33  Text Interpretation: Supraventricular tachycardia Ventricular premature complex Incomplete left bundle branch block When compared with ECG of EARLIER SAME DATE Supraventricular tachycardia has replaced Sinus rhythm Confirmed by Raford Lenis ([id number]) on 08/05/2023 11:53:56 PM        CRITICAL CARE Performed by: Lenis Raford Total critical care time: 35 minutes Critical care time was exclusive of separately billable procedures and treating other patients. Critical care was necessary to treat or prevent imminent or life-threatening deterioration. Critical care was time spent personally by me on the following activities: development of treatment plan with patient and/or surrogate as well as nursing, discussions with consultants, evaluation of patient's response to treatment, examination of patient, obtaining history from patient or surrogate, ordering and performing treatments and interventions, ordering and review of laboratory studies, ordering and review of radiographic studies, pulse oximetry and re-evaluation of patient's condition.    Raford Lenis, MD 08/06/23 0000

## 2023-08-05 NOTE — ED Triage Notes (Signed)
 Pt bib ems from [facility] c/o orthostatic hypotension. Around 0900 the pt had a one minute episode where he went absent. No hx of seizures. Pt returned to normal after episode and staff kept track of his vitals. Staff noticed that his BP was low and wanted him to be seen.   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

## 2023-08-06 DIAGNOSIS — I471 Supraventricular tachycardia, unspecified: Secondary | ICD-10-CM | POA: Diagnosis not present

## 2023-08-06 NOTE — ED Notes (Signed)
Dr. Preston Fleeting at bedside.

## 2023-08-06 NOTE — ED Notes (Signed)
 Attempted to call report x2, put on hold, then call disconnected.

## 2023-08-09 DIAGNOSIS — R2681 Unsteadiness on feet: Secondary | ICD-10-CM | POA: Diagnosis not present

## 2023-08-09 DIAGNOSIS — S069X0S Unspecified intracranial injury without loss of consciousness, sequela: Secondary | ICD-10-CM | POA: Diagnosis not present

## 2023-08-09 DIAGNOSIS — M6259 Muscle wasting and atrophy, not elsewhere classified, multiple sites: Secondary | ICD-10-CM | POA: Diagnosis not present

## 2023-08-10 DIAGNOSIS — R2681 Unsteadiness on feet: Secondary | ICD-10-CM | POA: Diagnosis not present

## 2023-08-10 DIAGNOSIS — M6259 Muscle wasting and atrophy, not elsewhere classified, multiple sites: Secondary | ICD-10-CM | POA: Diagnosis not present

## 2023-08-10 DIAGNOSIS — S069X0S Unspecified intracranial injury without loss of consciousness, sequela: Secondary | ICD-10-CM | POA: Diagnosis not present

## 2023-08-12 DIAGNOSIS — M6259 Muscle wasting and atrophy, not elsewhere classified, multiple sites: Secondary | ICD-10-CM | POA: Diagnosis not present

## 2023-08-12 DIAGNOSIS — S069X0S Unspecified intracranial injury without loss of consciousness, sequela: Secondary | ICD-10-CM | POA: Diagnosis not present

## 2023-08-12 DIAGNOSIS — R2681 Unsteadiness on feet: Secondary | ICD-10-CM | POA: Diagnosis not present

## 2023-08-14 DIAGNOSIS — M6259 Muscle wasting and atrophy, not elsewhere classified, multiple sites: Secondary | ICD-10-CM | POA: Diagnosis not present

## 2023-08-14 DIAGNOSIS — R2681 Unsteadiness on feet: Secondary | ICD-10-CM | POA: Diagnosis not present

## 2023-08-14 DIAGNOSIS — S069X0S Unspecified intracranial injury without loss of consciousness, sequela: Secondary | ICD-10-CM | POA: Diagnosis not present

## 2023-08-15 DIAGNOSIS — M6259 Muscle wasting and atrophy, not elsewhere classified, multiple sites: Secondary | ICD-10-CM | POA: Diagnosis not present

## 2023-08-15 DIAGNOSIS — R2681 Unsteadiness on feet: Secondary | ICD-10-CM | POA: Diagnosis not present

## 2023-08-15 DIAGNOSIS — S069X0S Unspecified intracranial injury without loss of consciousness, sequela: Secondary | ICD-10-CM | POA: Diagnosis not present

## 2023-08-16 DIAGNOSIS — M6259 Muscle wasting and atrophy, not elsewhere classified, multiple sites: Secondary | ICD-10-CM | POA: Diagnosis not present

## 2023-08-16 DIAGNOSIS — S069X0S Unspecified intracranial injury without loss of consciousness, sequela: Secondary | ICD-10-CM | POA: Diagnosis not present

## 2023-08-16 DIAGNOSIS — R2681 Unsteadiness on feet: Secondary | ICD-10-CM | POA: Diagnosis not present

## 2023-08-17 DIAGNOSIS — S069X0S Unspecified intracranial injury without loss of consciousness, sequela: Secondary | ICD-10-CM | POA: Diagnosis not present

## 2023-08-17 DIAGNOSIS — M6259 Muscle wasting and atrophy, not elsewhere classified, multiple sites: Secondary | ICD-10-CM | POA: Diagnosis not present

## 2023-08-17 DIAGNOSIS — R2681 Unsteadiness on feet: Secondary | ICD-10-CM | POA: Diagnosis not present

## 2023-08-18 DIAGNOSIS — M6259 Muscle wasting and atrophy, not elsewhere classified, multiple sites: Secondary | ICD-10-CM | POA: Diagnosis not present

## 2023-08-18 DIAGNOSIS — R2681 Unsteadiness on feet: Secondary | ICD-10-CM | POA: Diagnosis not present

## 2023-08-18 DIAGNOSIS — S069X0S Unspecified intracranial injury without loss of consciousness, sequela: Secondary | ICD-10-CM | POA: Diagnosis not present

## 2023-08-19 DIAGNOSIS — M6259 Muscle wasting and atrophy, not elsewhere classified, multiple sites: Secondary | ICD-10-CM | POA: Diagnosis not present

## 2023-08-19 DIAGNOSIS — R2681 Unsteadiness on feet: Secondary | ICD-10-CM | POA: Diagnosis not present

## 2023-08-19 DIAGNOSIS — S069X0S Unspecified intracranial injury without loss of consciousness, sequela: Secondary | ICD-10-CM | POA: Diagnosis not present

## 2023-08-21 DIAGNOSIS — M6259 Muscle wasting and atrophy, not elsewhere classified, multiple sites: Secondary | ICD-10-CM | POA: Diagnosis not present

## 2023-08-21 DIAGNOSIS — R2681 Unsteadiness on feet: Secondary | ICD-10-CM | POA: Diagnosis not present

## 2023-08-21 DIAGNOSIS — S069X0S Unspecified intracranial injury without loss of consciousness, sequela: Secondary | ICD-10-CM | POA: Diagnosis not present

## 2023-08-23 DIAGNOSIS — M6259 Muscle wasting and atrophy, not elsewhere classified, multiple sites: Secondary | ICD-10-CM | POA: Diagnosis not present

## 2023-08-23 DIAGNOSIS — R2681 Unsteadiness on feet: Secondary | ICD-10-CM | POA: Diagnosis not present

## 2023-08-23 DIAGNOSIS — S069X0S Unspecified intracranial injury without loss of consciousness, sequela: Secondary | ICD-10-CM | POA: Diagnosis not present

## 2023-08-24 DIAGNOSIS — S069X0S Unspecified intracranial injury without loss of consciousness, sequela: Secondary | ICD-10-CM | POA: Diagnosis not present

## 2023-08-24 DIAGNOSIS — R2681 Unsteadiness on feet: Secondary | ICD-10-CM | POA: Diagnosis not present

## 2023-08-24 DIAGNOSIS — M6259 Muscle wasting and atrophy, not elsewhere classified, multiple sites: Secondary | ICD-10-CM | POA: Diagnosis not present

## 2023-08-25 DIAGNOSIS — R2681 Unsteadiness on feet: Secondary | ICD-10-CM | POA: Diagnosis not present

## 2023-08-25 DIAGNOSIS — S069X0S Unspecified intracranial injury without loss of consciousness, sequela: Secondary | ICD-10-CM | POA: Diagnosis not present

## 2023-08-25 DIAGNOSIS — M6259 Muscle wasting and atrophy, not elsewhere classified, multiple sites: Secondary | ICD-10-CM | POA: Diagnosis not present

## 2023-08-28 DIAGNOSIS — M6259 Muscle wasting and atrophy, not elsewhere classified, multiple sites: Secondary | ICD-10-CM | POA: Diagnosis not present

## 2023-08-28 DIAGNOSIS — S069X0S Unspecified intracranial injury without loss of consciousness, sequela: Secondary | ICD-10-CM | POA: Diagnosis not present

## 2023-08-28 DIAGNOSIS — R2681 Unsteadiness on feet: Secondary | ICD-10-CM | POA: Diagnosis not present

## 2023-08-29 DIAGNOSIS — M6259 Muscle wasting and atrophy, not elsewhere classified, multiple sites: Secondary | ICD-10-CM | POA: Diagnosis not present

## 2023-08-29 DIAGNOSIS — S069X0S Unspecified intracranial injury without loss of consciousness, sequela: Secondary | ICD-10-CM | POA: Diagnosis not present

## 2023-08-29 DIAGNOSIS — R2681 Unsteadiness on feet: Secondary | ICD-10-CM | POA: Diagnosis not present

## 2023-08-30 DIAGNOSIS — S069X0S Unspecified intracranial injury without loss of consciousness, sequela: Secondary | ICD-10-CM | POA: Diagnosis not present

## 2023-08-30 DIAGNOSIS — M6259 Muscle wasting and atrophy, not elsewhere classified, multiple sites: Secondary | ICD-10-CM | POA: Diagnosis not present

## 2023-08-30 DIAGNOSIS — R2681 Unsteadiness on feet: Secondary | ICD-10-CM | POA: Diagnosis not present

## 2023-08-31 DIAGNOSIS — M6259 Muscle wasting and atrophy, not elsewhere classified, multiple sites: Secondary | ICD-10-CM | POA: Diagnosis not present

## 2023-08-31 DIAGNOSIS — R2681 Unsteadiness on feet: Secondary | ICD-10-CM | POA: Diagnosis not present

## 2023-08-31 DIAGNOSIS — S069X0S Unspecified intracranial injury without loss of consciousness, sequela: Secondary | ICD-10-CM | POA: Diagnosis not present

## 2023-09-02 DIAGNOSIS — S069X0S Unspecified intracranial injury without loss of consciousness, sequela: Secondary | ICD-10-CM | POA: Diagnosis not present

## 2023-09-02 DIAGNOSIS — M6259 Muscle wasting and atrophy, not elsewhere classified, multiple sites: Secondary | ICD-10-CM | POA: Diagnosis not present

## 2023-09-02 DIAGNOSIS — R2681 Unsteadiness on feet: Secondary | ICD-10-CM | POA: Diagnosis not present

## 2023-09-04 DIAGNOSIS — R2681 Unsteadiness on feet: Secondary | ICD-10-CM | POA: Diagnosis not present

## 2023-09-04 DIAGNOSIS — M6259 Muscle wasting and atrophy, not elsewhere classified, multiple sites: Secondary | ICD-10-CM | POA: Diagnosis not present

## 2023-09-04 DIAGNOSIS — S069X0S Unspecified intracranial injury without loss of consciousness, sequela: Secondary | ICD-10-CM | POA: Diagnosis not present

## 2023-09-05 DIAGNOSIS — M6259 Muscle wasting and atrophy, not elsewhere classified, multiple sites: Secondary | ICD-10-CM | POA: Diagnosis not present

## 2023-09-05 DIAGNOSIS — R2681 Unsteadiness on feet: Secondary | ICD-10-CM | POA: Diagnosis not present

## 2023-09-05 DIAGNOSIS — S069X0S Unspecified intracranial injury without loss of consciousness, sequela: Secondary | ICD-10-CM | POA: Diagnosis not present

## 2023-09-06 DIAGNOSIS — M6259 Muscle wasting and atrophy, not elsewhere classified, multiple sites: Secondary | ICD-10-CM | POA: Diagnosis not present

## 2023-09-06 DIAGNOSIS — R2681 Unsteadiness on feet: Secondary | ICD-10-CM | POA: Diagnosis not present

## 2023-09-06 DIAGNOSIS — S069X0S Unspecified intracranial injury without loss of consciousness, sequela: Secondary | ICD-10-CM | POA: Diagnosis not present

## 2023-09-07 DIAGNOSIS — M6259 Muscle wasting and atrophy, not elsewhere classified, multiple sites: Secondary | ICD-10-CM | POA: Diagnosis not present

## 2023-09-07 DIAGNOSIS — S069X0S Unspecified intracranial injury without loss of consciousness, sequela: Secondary | ICD-10-CM | POA: Diagnosis not present

## 2023-09-07 DIAGNOSIS — R2681 Unsteadiness on feet: Secondary | ICD-10-CM | POA: Diagnosis not present

## 2023-09-08 DIAGNOSIS — S069X0S Unspecified intracranial injury without loss of consciousness, sequela: Secondary | ICD-10-CM | POA: Diagnosis not present

## 2023-09-08 DIAGNOSIS — R2681 Unsteadiness on feet: Secondary | ICD-10-CM | POA: Diagnosis not present

## 2023-09-08 DIAGNOSIS — M6259 Muscle wasting and atrophy, not elsewhere classified, multiple sites: Secondary | ICD-10-CM | POA: Diagnosis not present

## 2023-09-09 DIAGNOSIS — R2681 Unsteadiness on feet: Secondary | ICD-10-CM | POA: Diagnosis not present

## 2023-09-09 DIAGNOSIS — M6259 Muscle wasting and atrophy, not elsewhere classified, multiple sites: Secondary | ICD-10-CM | POA: Diagnosis not present

## 2023-09-09 DIAGNOSIS — S069X0S Unspecified intracranial injury without loss of consciousness, sequela: Secondary | ICD-10-CM | POA: Diagnosis not present

## 2023-09-12 DIAGNOSIS — M6259 Muscle wasting and atrophy, not elsewhere classified, multiple sites: Secondary | ICD-10-CM | POA: Diagnosis not present

## 2023-09-12 DIAGNOSIS — R2681 Unsteadiness on feet: Secondary | ICD-10-CM | POA: Diagnosis not present

## 2023-09-12 DIAGNOSIS — S069X0S Unspecified intracranial injury without loss of consciousness, sequela: Secondary | ICD-10-CM | POA: Diagnosis not present

## 2023-09-13 DIAGNOSIS — R2681 Unsteadiness on feet: Secondary | ICD-10-CM | POA: Diagnosis not present

## 2023-09-13 DIAGNOSIS — S069X0S Unspecified intracranial injury without loss of consciousness, sequela: Secondary | ICD-10-CM | POA: Diagnosis not present

## 2023-09-13 DIAGNOSIS — M6259 Muscle wasting and atrophy, not elsewhere classified, multiple sites: Secondary | ICD-10-CM | POA: Diagnosis not present

## 2023-09-14 DIAGNOSIS — R2681 Unsteadiness on feet: Secondary | ICD-10-CM | POA: Diagnosis not present

## 2023-09-14 DIAGNOSIS — M6259 Muscle wasting and atrophy, not elsewhere classified, multiple sites: Secondary | ICD-10-CM | POA: Diagnosis not present

## 2023-09-14 DIAGNOSIS — S069X0S Unspecified intracranial injury without loss of consciousness, sequela: Secondary | ICD-10-CM | POA: Diagnosis not present

## 2023-09-15 DIAGNOSIS — S069X0S Unspecified intracranial injury without loss of consciousness, sequela: Secondary | ICD-10-CM | POA: Diagnosis not present

## 2023-09-15 DIAGNOSIS — R2681 Unsteadiness on feet: Secondary | ICD-10-CM | POA: Diagnosis not present

## 2023-09-15 DIAGNOSIS — M6259 Muscle wasting and atrophy, not elsewhere classified, multiple sites: Secondary | ICD-10-CM | POA: Diagnosis not present

## 2023-10-10 DIAGNOSIS — R41841 Cognitive communication deficit: Secondary | ICD-10-CM | POA: Diagnosis not present

## 2023-10-10 DIAGNOSIS — S069X0S Unspecified intracranial injury without loss of consciousness, sequela: Secondary | ICD-10-CM | POA: Diagnosis not present

## 2023-10-11 DIAGNOSIS — R41841 Cognitive communication deficit: Secondary | ICD-10-CM | POA: Diagnosis not present

## 2023-10-11 DIAGNOSIS — S069X0S Unspecified intracranial injury without loss of consciousness, sequela: Secondary | ICD-10-CM | POA: Diagnosis not present

## 2023-10-12 DIAGNOSIS — S069X0S Unspecified intracranial injury without loss of consciousness, sequela: Secondary | ICD-10-CM | POA: Diagnosis not present

## 2023-10-12 DIAGNOSIS — R41841 Cognitive communication deficit: Secondary | ICD-10-CM | POA: Diagnosis not present

## 2023-10-13 DIAGNOSIS — S069X0S Unspecified intracranial injury without loss of consciousness, sequela: Secondary | ICD-10-CM | POA: Diagnosis not present

## 2023-10-13 DIAGNOSIS — R41841 Cognitive communication deficit: Secondary | ICD-10-CM | POA: Diagnosis not present

## 2023-10-16 DIAGNOSIS — S069X0S Unspecified intracranial injury without loss of consciousness, sequela: Secondary | ICD-10-CM | POA: Diagnosis not present

## 2023-10-16 DIAGNOSIS — R41841 Cognitive communication deficit: Secondary | ICD-10-CM | POA: Diagnosis not present

## 2023-10-17 DIAGNOSIS — S069X0S Unspecified intracranial injury without loss of consciousness, sequela: Secondary | ICD-10-CM | POA: Diagnosis not present

## 2023-10-17 DIAGNOSIS — R41841 Cognitive communication deficit: Secondary | ICD-10-CM | POA: Diagnosis not present

## 2023-10-18 DIAGNOSIS — R41841 Cognitive communication deficit: Secondary | ICD-10-CM | POA: Diagnosis not present

## 2023-10-18 DIAGNOSIS — S069X0S Unspecified intracranial injury without loss of consciousness, sequela: Secondary | ICD-10-CM | POA: Diagnosis not present

## 2023-10-19 DIAGNOSIS — R41841 Cognitive communication deficit: Secondary | ICD-10-CM | POA: Diagnosis not present

## 2023-10-19 DIAGNOSIS — S069X0S Unspecified intracranial injury without loss of consciousness, sequela: Secondary | ICD-10-CM | POA: Diagnosis not present

## 2023-10-20 DIAGNOSIS — R41841 Cognitive communication deficit: Secondary | ICD-10-CM | POA: Diagnosis not present

## 2023-10-20 DIAGNOSIS — S069X0S Unspecified intracranial injury without loss of consciousness, sequela: Secondary | ICD-10-CM | POA: Diagnosis not present

## 2023-10-23 DIAGNOSIS — S069X0S Unspecified intracranial injury without loss of consciousness, sequela: Secondary | ICD-10-CM | POA: Diagnosis not present

## 2023-10-23 DIAGNOSIS — R41841 Cognitive communication deficit: Secondary | ICD-10-CM | POA: Diagnosis not present

## 2023-10-24 DIAGNOSIS — S069X0S Unspecified intracranial injury without loss of consciousness, sequela: Secondary | ICD-10-CM | POA: Diagnosis not present

## 2023-10-24 DIAGNOSIS — R41841 Cognitive communication deficit: Secondary | ICD-10-CM | POA: Diagnosis not present

## 2023-10-25 DIAGNOSIS — S069X0S Unspecified intracranial injury without loss of consciousness, sequela: Secondary | ICD-10-CM | POA: Diagnosis not present

## 2023-10-25 DIAGNOSIS — R41841 Cognitive communication deficit: Secondary | ICD-10-CM | POA: Diagnosis not present

## 2023-10-26 DIAGNOSIS — R41841 Cognitive communication deficit: Secondary | ICD-10-CM | POA: Diagnosis not present

## 2023-10-26 DIAGNOSIS — S069X0S Unspecified intracranial injury without loss of consciousness, sequela: Secondary | ICD-10-CM | POA: Diagnosis not present

## 2023-10-27 DIAGNOSIS — R41841 Cognitive communication deficit: Secondary | ICD-10-CM | POA: Diagnosis not present

## 2023-10-27 DIAGNOSIS — S069X0S Unspecified intracranial injury without loss of consciousness, sequela: Secondary | ICD-10-CM | POA: Diagnosis not present

## 2023-10-30 DIAGNOSIS — R41841 Cognitive communication deficit: Secondary | ICD-10-CM | POA: Diagnosis not present

## 2023-10-30 DIAGNOSIS — S069X0S Unspecified intracranial injury without loss of consciousness, sequela: Secondary | ICD-10-CM | POA: Diagnosis not present

## 2023-10-31 DIAGNOSIS — S069X0S Unspecified intracranial injury without loss of consciousness, sequela: Secondary | ICD-10-CM | POA: Diagnosis not present

## 2023-10-31 DIAGNOSIS — R41841 Cognitive communication deficit: Secondary | ICD-10-CM | POA: Diagnosis not present

## 2023-11-01 DIAGNOSIS — S069X0S Unspecified intracranial injury without loss of consciousness, sequela: Secondary | ICD-10-CM | POA: Diagnosis not present

## 2023-11-01 DIAGNOSIS — R41841 Cognitive communication deficit: Secondary | ICD-10-CM | POA: Diagnosis not present

## 2023-11-02 DIAGNOSIS — S069X0S Unspecified intracranial injury without loss of consciousness, sequela: Secondary | ICD-10-CM | POA: Diagnosis not present

## 2023-11-02 DIAGNOSIS — R41841 Cognitive communication deficit: Secondary | ICD-10-CM | POA: Diagnosis not present

## 2023-11-03 DIAGNOSIS — S069X0S Unspecified intracranial injury without loss of consciousness, sequela: Secondary | ICD-10-CM | POA: Diagnosis not present

## 2023-11-03 DIAGNOSIS — R41841 Cognitive communication deficit: Secondary | ICD-10-CM | POA: Diagnosis not present

## 2023-11-06 DIAGNOSIS — R41841 Cognitive communication deficit: Secondary | ICD-10-CM | POA: Diagnosis not present

## 2023-11-06 DIAGNOSIS — S069X0S Unspecified intracranial injury without loss of consciousness, sequela: Secondary | ICD-10-CM | POA: Diagnosis not present

## 2023-11-07 DIAGNOSIS — R41841 Cognitive communication deficit: Secondary | ICD-10-CM | POA: Diagnosis not present

## 2023-11-07 DIAGNOSIS — S069X0S Unspecified intracranial injury without loss of consciousness, sequela: Secondary | ICD-10-CM | POA: Diagnosis not present

## 2023-11-08 DIAGNOSIS — S069X0S Unspecified intracranial injury without loss of consciousness, sequela: Secondary | ICD-10-CM | POA: Diagnosis not present

## 2023-11-08 DIAGNOSIS — R41841 Cognitive communication deficit: Secondary | ICD-10-CM | POA: Diagnosis not present

## 2023-11-09 DIAGNOSIS — S069X0S Unspecified intracranial injury without loss of consciousness, sequela: Secondary | ICD-10-CM | POA: Diagnosis not present

## 2023-11-09 DIAGNOSIS — R41841 Cognitive communication deficit: Secondary | ICD-10-CM | POA: Diagnosis not present

## 2023-11-10 DIAGNOSIS — S069X0S Unspecified intracranial injury without loss of consciousness, sequela: Secondary | ICD-10-CM | POA: Diagnosis not present

## 2023-11-10 DIAGNOSIS — R41841 Cognitive communication deficit: Secondary | ICD-10-CM | POA: Diagnosis not present

## 2023-11-13 DIAGNOSIS — S069X0S Unspecified intracranial injury without loss of consciousness, sequela: Secondary | ICD-10-CM | POA: Diagnosis not present

## 2023-11-13 DIAGNOSIS — R41841 Cognitive communication deficit: Secondary | ICD-10-CM | POA: Diagnosis not present

## 2023-11-14 DIAGNOSIS — R41841 Cognitive communication deficit: Secondary | ICD-10-CM | POA: Diagnosis not present

## 2023-11-14 DIAGNOSIS — S069X0S Unspecified intracranial injury without loss of consciousness, sequela: Secondary | ICD-10-CM | POA: Diagnosis not present

## 2023-11-15 DIAGNOSIS — S069X0S Unspecified intracranial injury without loss of consciousness, sequela: Secondary | ICD-10-CM | POA: Diagnosis not present

## 2023-11-15 DIAGNOSIS — R41841 Cognitive communication deficit: Secondary | ICD-10-CM | POA: Diagnosis not present

## 2023-11-16 DIAGNOSIS — R41841 Cognitive communication deficit: Secondary | ICD-10-CM | POA: Diagnosis not present

## 2023-11-16 DIAGNOSIS — S069X0S Unspecified intracranial injury without loss of consciousness, sequela: Secondary | ICD-10-CM | POA: Diagnosis not present

## 2023-11-17 DIAGNOSIS — S069X0S Unspecified intracranial injury without loss of consciousness, sequela: Secondary | ICD-10-CM | POA: Diagnosis not present

## 2023-11-17 DIAGNOSIS — R41841 Cognitive communication deficit: Secondary | ICD-10-CM | POA: Diagnosis not present

## 2023-11-20 DIAGNOSIS — R41841 Cognitive communication deficit: Secondary | ICD-10-CM | POA: Diagnosis not present

## 2023-11-20 DIAGNOSIS — S069X0S Unspecified intracranial injury without loss of consciousness, sequela: Secondary | ICD-10-CM | POA: Diagnosis not present

## 2023-11-21 DIAGNOSIS — S069X0S Unspecified intracranial injury without loss of consciousness, sequela: Secondary | ICD-10-CM | POA: Diagnosis not present

## 2023-11-21 DIAGNOSIS — R41841 Cognitive communication deficit: Secondary | ICD-10-CM | POA: Diagnosis not present

## 2023-11-22 DIAGNOSIS — S069X0S Unspecified intracranial injury without loss of consciousness, sequela: Secondary | ICD-10-CM | POA: Diagnosis not present

## 2023-11-22 DIAGNOSIS — R41841 Cognitive communication deficit: Secondary | ICD-10-CM | POA: Diagnosis not present

## 2023-11-23 DIAGNOSIS — R41841 Cognitive communication deficit: Secondary | ICD-10-CM | POA: Diagnosis not present

## 2023-11-23 DIAGNOSIS — S069X0S Unspecified intracranial injury without loss of consciousness, sequela: Secondary | ICD-10-CM | POA: Diagnosis not present

## 2023-11-24 DIAGNOSIS — R41841 Cognitive communication deficit: Secondary | ICD-10-CM | POA: Diagnosis not present

## 2023-11-24 DIAGNOSIS — S069X0S Unspecified intracranial injury without loss of consciousness, sequela: Secondary | ICD-10-CM | POA: Diagnosis not present

## 2023-11-27 DIAGNOSIS — R41841 Cognitive communication deficit: Secondary | ICD-10-CM | POA: Diagnosis not present

## 2023-11-27 DIAGNOSIS — S069X0S Unspecified intracranial injury without loss of consciousness, sequela: Secondary | ICD-10-CM | POA: Diagnosis not present

## 2023-11-28 DIAGNOSIS — S069X0S Unspecified intracranial injury without loss of consciousness, sequela: Secondary | ICD-10-CM | POA: Diagnosis not present

## 2023-11-28 DIAGNOSIS — R41841 Cognitive communication deficit: Secondary | ICD-10-CM | POA: Diagnosis not present

## 2023-11-29 DIAGNOSIS — S069X0S Unspecified intracranial injury without loss of consciousness, sequela: Secondary | ICD-10-CM | POA: Diagnosis not present

## 2023-11-29 DIAGNOSIS — R41841 Cognitive communication deficit: Secondary | ICD-10-CM | POA: Diagnosis not present

## 2023-12-02 DIAGNOSIS — S069X0S Unspecified intracranial injury without loss of consciousness, sequela: Secondary | ICD-10-CM | POA: Diagnosis not present

## 2023-12-02 DIAGNOSIS — R41841 Cognitive communication deficit: Secondary | ICD-10-CM | POA: Diagnosis not present

## 2023-12-03 DIAGNOSIS — R41841 Cognitive communication deficit: Secondary | ICD-10-CM | POA: Diagnosis not present

## 2023-12-03 DIAGNOSIS — S069X0S Unspecified intracranial injury without loss of consciousness, sequela: Secondary | ICD-10-CM | POA: Diagnosis not present

## 2023-12-04 DIAGNOSIS — S069X0S Unspecified intracranial injury without loss of consciousness, sequela: Secondary | ICD-10-CM | POA: Diagnosis not present

## 2023-12-04 DIAGNOSIS — R41841 Cognitive communication deficit: Secondary | ICD-10-CM | POA: Diagnosis not present

## 2023-12-06 DIAGNOSIS — S069X0S Unspecified intracranial injury without loss of consciousness, sequela: Secondary | ICD-10-CM | POA: Diagnosis not present

## 2023-12-06 DIAGNOSIS — R41841 Cognitive communication deficit: Secondary | ICD-10-CM | POA: Diagnosis not present

## 2023-12-07 DIAGNOSIS — S069X0S Unspecified intracranial injury without loss of consciousness, sequela: Secondary | ICD-10-CM | POA: Diagnosis not present

## 2023-12-07 DIAGNOSIS — R41841 Cognitive communication deficit: Secondary | ICD-10-CM | POA: Diagnosis not present

## 2023-12-08 DIAGNOSIS — R41841 Cognitive communication deficit: Secondary | ICD-10-CM | POA: Diagnosis not present

## 2023-12-08 DIAGNOSIS — S069X0S Unspecified intracranial injury without loss of consciousness, sequela: Secondary | ICD-10-CM | POA: Diagnosis not present

## 2023-12-09 DIAGNOSIS — R41841 Cognitive communication deficit: Secondary | ICD-10-CM | POA: Diagnosis not present

## 2023-12-09 DIAGNOSIS — S069X0S Unspecified intracranial injury without loss of consciousness, sequela: Secondary | ICD-10-CM | POA: Diagnosis not present

## 2023-12-11 DIAGNOSIS — R41841 Cognitive communication deficit: Secondary | ICD-10-CM | POA: Diagnosis not present

## 2023-12-11 DIAGNOSIS — S069X0S Unspecified intracranial injury without loss of consciousness, sequela: Secondary | ICD-10-CM | POA: Diagnosis not present

## 2023-12-12 DIAGNOSIS — R41841 Cognitive communication deficit: Secondary | ICD-10-CM | POA: Diagnosis not present

## 2023-12-12 DIAGNOSIS — S069X0S Unspecified intracranial injury without loss of consciousness, sequela: Secondary | ICD-10-CM | POA: Diagnosis not present

## 2023-12-13 DIAGNOSIS — R41841 Cognitive communication deficit: Secondary | ICD-10-CM | POA: Diagnosis not present

## 2023-12-13 DIAGNOSIS — S069X0S Unspecified intracranial injury without loss of consciousness, sequela: Secondary | ICD-10-CM | POA: Diagnosis not present

## 2023-12-14 DIAGNOSIS — R41841 Cognitive communication deficit: Secondary | ICD-10-CM | POA: Diagnosis not present

## 2023-12-14 DIAGNOSIS — S069X0S Unspecified intracranial injury without loss of consciousness, sequela: Secondary | ICD-10-CM | POA: Diagnosis not present

## 2023-12-15 DIAGNOSIS — R41841 Cognitive communication deficit: Secondary | ICD-10-CM | POA: Diagnosis not present

## 2023-12-15 DIAGNOSIS — S069X0S Unspecified intracranial injury without loss of consciousness, sequela: Secondary | ICD-10-CM | POA: Diagnosis not present

## 2023-12-18 DIAGNOSIS — R41841 Cognitive communication deficit: Secondary | ICD-10-CM | POA: Diagnosis not present

## 2023-12-18 DIAGNOSIS — S069X0S Unspecified intracranial injury without loss of consciousness, sequela: Secondary | ICD-10-CM | POA: Diagnosis not present

## 2023-12-19 DIAGNOSIS — S069X0S Unspecified intracranial injury without loss of consciousness, sequela: Secondary | ICD-10-CM | POA: Diagnosis not present

## 2023-12-19 DIAGNOSIS — R41841 Cognitive communication deficit: Secondary | ICD-10-CM | POA: Diagnosis not present

## 2023-12-20 DIAGNOSIS — S069X0S Unspecified intracranial injury without loss of consciousness, sequela: Secondary | ICD-10-CM | POA: Diagnosis not present

## 2023-12-20 DIAGNOSIS — R41841 Cognitive communication deficit: Secondary | ICD-10-CM | POA: Diagnosis not present

## 2023-12-22 DIAGNOSIS — S069X0S Unspecified intracranial injury without loss of consciousness, sequela: Secondary | ICD-10-CM | POA: Diagnosis not present

## 2023-12-22 DIAGNOSIS — R41841 Cognitive communication deficit: Secondary | ICD-10-CM | POA: Diagnosis not present

## 2023-12-23 DIAGNOSIS — R41841 Cognitive communication deficit: Secondary | ICD-10-CM | POA: Diagnosis not present

## 2023-12-23 DIAGNOSIS — S069X0S Unspecified intracranial injury without loss of consciousness, sequela: Secondary | ICD-10-CM | POA: Diagnosis not present

## 2023-12-25 DIAGNOSIS — S069X0S Unspecified intracranial injury without loss of consciousness, sequela: Secondary | ICD-10-CM | POA: Diagnosis not present

## 2023-12-26 DIAGNOSIS — S069X0S Unspecified intracranial injury without loss of consciousness, sequela: Secondary | ICD-10-CM | POA: Diagnosis not present

## 2024-02-20 DIAGNOSIS — R41841 Cognitive communication deficit: Secondary | ICD-10-CM | POA: Diagnosis not present

## 2024-02-20 DIAGNOSIS — S069X0S Unspecified intracranial injury without loss of consciousness, sequela: Secondary | ICD-10-CM | POA: Diagnosis not present

## 2024-02-21 DIAGNOSIS — R41841 Cognitive communication deficit: Secondary | ICD-10-CM | POA: Diagnosis not present

## 2024-02-21 DIAGNOSIS — S069X0S Unspecified intracranial injury without loss of consciousness, sequela: Secondary | ICD-10-CM | POA: Diagnosis not present

## 2024-02-22 DIAGNOSIS — R41841 Cognitive communication deficit: Secondary | ICD-10-CM | POA: Diagnosis not present

## 2024-02-22 DIAGNOSIS — S069X0S Unspecified intracranial injury without loss of consciousness, sequela: Secondary | ICD-10-CM | POA: Diagnosis not present

## 2024-02-26 DIAGNOSIS — S069X0S Unspecified intracranial injury without loss of consciousness, sequela: Secondary | ICD-10-CM | POA: Diagnosis not present

## 2024-02-26 DIAGNOSIS — R41841 Cognitive communication deficit: Secondary | ICD-10-CM | POA: Diagnosis not present

## 2024-02-27 DIAGNOSIS — R41841 Cognitive communication deficit: Secondary | ICD-10-CM | POA: Diagnosis not present

## 2024-02-27 DIAGNOSIS — S069X0S Unspecified intracranial injury without loss of consciousness, sequela: Secondary | ICD-10-CM | POA: Diagnosis not present

## 2024-02-28 DIAGNOSIS — S069X0S Unspecified intracranial injury without loss of consciousness, sequela: Secondary | ICD-10-CM | POA: Diagnosis not present

## 2024-02-28 DIAGNOSIS — R41841 Cognitive communication deficit: Secondary | ICD-10-CM | POA: Diagnosis not present

## 2024-02-29 DIAGNOSIS — R41841 Cognitive communication deficit: Secondary | ICD-10-CM | POA: Diagnosis not present

## 2024-02-29 DIAGNOSIS — S069X0S Unspecified intracranial injury without loss of consciousness, sequela: Secondary | ICD-10-CM | POA: Diagnosis not present

## 2024-03-01 DIAGNOSIS — S069X0S Unspecified intracranial injury without loss of consciousness, sequela: Secondary | ICD-10-CM | POA: Diagnosis not present

## 2024-03-01 DIAGNOSIS — R41841 Cognitive communication deficit: Secondary | ICD-10-CM | POA: Diagnosis not present

## 2024-03-05 DIAGNOSIS — R41841 Cognitive communication deficit: Secondary | ICD-10-CM | POA: Diagnosis not present

## 2024-03-05 DIAGNOSIS — S069X0S Unspecified intracranial injury without loss of consciousness, sequela: Secondary | ICD-10-CM | POA: Diagnosis not present

## 2024-03-08 DIAGNOSIS — R41841 Cognitive communication deficit: Secondary | ICD-10-CM | POA: Diagnosis not present

## 2024-03-08 DIAGNOSIS — S069X0S Unspecified intracranial injury without loss of consciousness, sequela: Secondary | ICD-10-CM | POA: Diagnosis not present

## 2024-03-09 DIAGNOSIS — S069X0S Unspecified intracranial injury without loss of consciousness, sequela: Secondary | ICD-10-CM | POA: Diagnosis not present

## 2024-03-09 DIAGNOSIS — R41841 Cognitive communication deficit: Secondary | ICD-10-CM | POA: Diagnosis not present

## 2024-03-11 DIAGNOSIS — R41841 Cognitive communication deficit: Secondary | ICD-10-CM | POA: Diagnosis not present

## 2024-03-11 DIAGNOSIS — S069X0S Unspecified intracranial injury without loss of consciousness, sequela: Secondary | ICD-10-CM | POA: Diagnosis not present

## 2024-03-12 DIAGNOSIS — R41841 Cognitive communication deficit: Secondary | ICD-10-CM | POA: Diagnosis not present

## 2024-03-12 DIAGNOSIS — S069X0S Unspecified intracranial injury without loss of consciousness, sequela: Secondary | ICD-10-CM | POA: Diagnosis not present

## 2024-03-13 DIAGNOSIS — S069X0S Unspecified intracranial injury without loss of consciousness, sequela: Secondary | ICD-10-CM | POA: Diagnosis not present

## 2024-03-13 DIAGNOSIS — R41841 Cognitive communication deficit: Secondary | ICD-10-CM | POA: Diagnosis not present

## 2024-03-14 DIAGNOSIS — R41841 Cognitive communication deficit: Secondary | ICD-10-CM | POA: Diagnosis not present

## 2024-03-14 DIAGNOSIS — S069X0S Unspecified intracranial injury without loss of consciousness, sequela: Secondary | ICD-10-CM | POA: Diagnosis not present

## 2024-03-15 DIAGNOSIS — R41841 Cognitive communication deficit: Secondary | ICD-10-CM | POA: Diagnosis not present

## 2024-03-15 DIAGNOSIS — S069X0S Unspecified intracranial injury without loss of consciousness, sequela: Secondary | ICD-10-CM | POA: Diagnosis not present

## 2024-03-19 DIAGNOSIS — S069X0S Unspecified intracranial injury without loss of consciousness, sequela: Secondary | ICD-10-CM | POA: Diagnosis not present

## 2024-03-19 DIAGNOSIS — R41841 Cognitive communication deficit: Secondary | ICD-10-CM | POA: Diagnosis not present

## 2024-03-20 DIAGNOSIS — R41841 Cognitive communication deficit: Secondary | ICD-10-CM | POA: Diagnosis not present

## 2024-03-20 DIAGNOSIS — S069X0S Unspecified intracranial injury without loss of consciousness, sequela: Secondary | ICD-10-CM | POA: Diagnosis not present

## 2024-03-21 DIAGNOSIS — S069X0S Unspecified intracranial injury without loss of consciousness, sequela: Secondary | ICD-10-CM | POA: Diagnosis not present

## 2024-03-21 DIAGNOSIS — R41841 Cognitive communication deficit: Secondary | ICD-10-CM | POA: Diagnosis not present

## 2024-03-23 DIAGNOSIS — S069X0S Unspecified intracranial injury without loss of consciousness, sequela: Secondary | ICD-10-CM | POA: Diagnosis not present

## 2024-03-23 DIAGNOSIS — R41841 Cognitive communication deficit: Secondary | ICD-10-CM | POA: Diagnosis not present

## 2024-03-26 DIAGNOSIS — S069X0S Unspecified intracranial injury without loss of consciousness, sequela: Secondary | ICD-10-CM | POA: Diagnosis not present

## 2024-03-26 DIAGNOSIS — R41841 Cognitive communication deficit: Secondary | ICD-10-CM | POA: Diagnosis not present

## 2024-03-26 DIAGNOSIS — M6281 Muscle weakness (generalized): Secondary | ICD-10-CM | POA: Diagnosis not present

## 2024-03-28 DIAGNOSIS — R41841 Cognitive communication deficit: Secondary | ICD-10-CM | POA: Diagnosis not present

## 2024-03-28 DIAGNOSIS — S069X0S Unspecified intracranial injury without loss of consciousness, sequela: Secondary | ICD-10-CM | POA: Diagnosis not present

## 2024-03-28 DIAGNOSIS — M6281 Muscle weakness (generalized): Secondary | ICD-10-CM | POA: Diagnosis not present

## 2024-03-29 DIAGNOSIS — S069X0S Unspecified intracranial injury without loss of consciousness, sequela: Secondary | ICD-10-CM | POA: Diagnosis not present

## 2024-03-29 DIAGNOSIS — R41841 Cognitive communication deficit: Secondary | ICD-10-CM | POA: Diagnosis not present

## 2024-03-29 DIAGNOSIS — M6281 Muscle weakness (generalized): Secondary | ICD-10-CM | POA: Diagnosis not present

## 2024-03-29 DIAGNOSIS — R2681 Unsteadiness on feet: Secondary | ICD-10-CM | POA: Diagnosis not present

## 2024-03-30 DIAGNOSIS — M6281 Muscle weakness (generalized): Secondary | ICD-10-CM | POA: Diagnosis not present

## 2024-03-30 DIAGNOSIS — R2681 Unsteadiness on feet: Secondary | ICD-10-CM | POA: Diagnosis not present

## 2024-03-30 DIAGNOSIS — R41841 Cognitive communication deficit: Secondary | ICD-10-CM | POA: Diagnosis not present

## 2024-03-30 DIAGNOSIS — S069X0S Unspecified intracranial injury without loss of consciousness, sequela: Secondary | ICD-10-CM | POA: Diagnosis not present

## 2024-04-02 DIAGNOSIS — S069X0S Unspecified intracranial injury without loss of consciousness, sequela: Secondary | ICD-10-CM | POA: Diagnosis not present

## 2024-04-02 DIAGNOSIS — M6281 Muscle weakness (generalized): Secondary | ICD-10-CM | POA: Diagnosis not present

## 2024-04-02 DIAGNOSIS — R41841 Cognitive communication deficit: Secondary | ICD-10-CM | POA: Diagnosis not present

## 2024-04-03 DIAGNOSIS — M6281 Muscle weakness (generalized): Secondary | ICD-10-CM | POA: Diagnosis not present

## 2024-04-03 DIAGNOSIS — R41841 Cognitive communication deficit: Secondary | ICD-10-CM | POA: Diagnosis not present

## 2024-04-03 DIAGNOSIS — S069X0S Unspecified intracranial injury without loss of consciousness, sequela: Secondary | ICD-10-CM | POA: Diagnosis not present

## 2024-04-03 DIAGNOSIS — R2681 Unsteadiness on feet: Secondary | ICD-10-CM | POA: Diagnosis not present

## 2024-04-04 DIAGNOSIS — R41841 Cognitive communication deficit: Secondary | ICD-10-CM | POA: Diagnosis not present

## 2024-04-04 DIAGNOSIS — M6281 Muscle weakness (generalized): Secondary | ICD-10-CM | POA: Diagnosis not present

## 2024-04-04 DIAGNOSIS — S069X0S Unspecified intracranial injury without loss of consciousness, sequela: Secondary | ICD-10-CM | POA: Diagnosis not present

## 2024-04-04 DIAGNOSIS — R2681 Unsteadiness on feet: Secondary | ICD-10-CM | POA: Diagnosis not present

## 2024-04-05 DIAGNOSIS — R2681 Unsteadiness on feet: Secondary | ICD-10-CM | POA: Diagnosis not present

## 2024-04-05 DIAGNOSIS — M6281 Muscle weakness (generalized): Secondary | ICD-10-CM | POA: Diagnosis not present

## 2024-04-05 DIAGNOSIS — R41841 Cognitive communication deficit: Secondary | ICD-10-CM | POA: Diagnosis not present

## 2024-04-05 DIAGNOSIS — S069X0S Unspecified intracranial injury without loss of consciousness, sequela: Secondary | ICD-10-CM | POA: Diagnosis not present

## 2024-04-06 DIAGNOSIS — R41841 Cognitive communication deficit: Secondary | ICD-10-CM | POA: Diagnosis not present

## 2024-04-06 DIAGNOSIS — R2681 Unsteadiness on feet: Secondary | ICD-10-CM | POA: Diagnosis not present

## 2024-04-06 DIAGNOSIS — S069X0S Unspecified intracranial injury without loss of consciousness, sequela: Secondary | ICD-10-CM | POA: Diagnosis not present

## 2024-04-08 DIAGNOSIS — S069X0S Unspecified intracranial injury without loss of consciousness, sequela: Secondary | ICD-10-CM | POA: Diagnosis not present

## 2024-04-08 DIAGNOSIS — R41841 Cognitive communication deficit: Secondary | ICD-10-CM | POA: Diagnosis not present

## 2024-04-08 DIAGNOSIS — R2681 Unsteadiness on feet: Secondary | ICD-10-CM | POA: Diagnosis not present

## 2024-04-09 DIAGNOSIS — S069X0S Unspecified intracranial injury without loss of consciousness, sequela: Secondary | ICD-10-CM | POA: Diagnosis not present

## 2024-04-09 DIAGNOSIS — R41841 Cognitive communication deficit: Secondary | ICD-10-CM | POA: Diagnosis not present

## 2024-04-09 DIAGNOSIS — M6281 Muscle weakness (generalized): Secondary | ICD-10-CM | POA: Diagnosis not present

## 2024-04-10 DIAGNOSIS — M6281 Muscle weakness (generalized): Secondary | ICD-10-CM | POA: Diagnosis not present

## 2024-04-10 DIAGNOSIS — S069X0S Unspecified intracranial injury without loss of consciousness, sequela: Secondary | ICD-10-CM | POA: Diagnosis not present

## 2024-04-10 DIAGNOSIS — R41841 Cognitive communication deficit: Secondary | ICD-10-CM | POA: Diagnosis not present

## 2024-04-11 DIAGNOSIS — S069X0S Unspecified intracranial injury without loss of consciousness, sequela: Secondary | ICD-10-CM | POA: Diagnosis not present

## 2024-04-11 DIAGNOSIS — R2681 Unsteadiness on feet: Secondary | ICD-10-CM | POA: Diagnosis not present

## 2024-04-11 DIAGNOSIS — M6281 Muscle weakness (generalized): Secondary | ICD-10-CM | POA: Diagnosis not present

## 2024-04-11 DIAGNOSIS — R41841 Cognitive communication deficit: Secondary | ICD-10-CM | POA: Diagnosis not present

## 2024-04-13 DIAGNOSIS — R41841 Cognitive communication deficit: Secondary | ICD-10-CM | POA: Diagnosis not present

## 2024-04-13 DIAGNOSIS — S069X0S Unspecified intracranial injury without loss of consciousness, sequela: Secondary | ICD-10-CM | POA: Diagnosis not present

## 2024-04-13 DIAGNOSIS — M6281 Muscle weakness (generalized): Secondary | ICD-10-CM | POA: Diagnosis not present

## 2024-04-14 DIAGNOSIS — S069X0S Unspecified intracranial injury without loss of consciousness, sequela: Secondary | ICD-10-CM | POA: Diagnosis not present

## 2024-04-14 DIAGNOSIS — M6281 Muscle weakness (generalized): Secondary | ICD-10-CM | POA: Diagnosis not present

## 2024-04-14 DIAGNOSIS — R41841 Cognitive communication deficit: Secondary | ICD-10-CM | POA: Diagnosis not present

## 2024-04-15 DIAGNOSIS — R2681 Unsteadiness on feet: Secondary | ICD-10-CM | POA: Diagnosis not present

## 2024-04-15 DIAGNOSIS — R41841 Cognitive communication deficit: Secondary | ICD-10-CM | POA: Diagnosis not present

## 2024-04-15 DIAGNOSIS — M6281 Muscle weakness (generalized): Secondary | ICD-10-CM | POA: Diagnosis not present

## 2024-04-15 DIAGNOSIS — S069X0S Unspecified intracranial injury without loss of consciousness, sequela: Secondary | ICD-10-CM | POA: Diagnosis not present

## 2024-04-16 DIAGNOSIS — M6281 Muscle weakness (generalized): Secondary | ICD-10-CM | POA: Diagnosis not present

## 2024-04-16 DIAGNOSIS — R41841 Cognitive communication deficit: Secondary | ICD-10-CM | POA: Diagnosis not present

## 2024-04-16 DIAGNOSIS — S069X0S Unspecified intracranial injury without loss of consciousness, sequela: Secondary | ICD-10-CM | POA: Diagnosis not present

## 2024-04-16 DIAGNOSIS — R2681 Unsteadiness on feet: Secondary | ICD-10-CM | POA: Diagnosis not present

## 2024-04-17 DIAGNOSIS — R41841 Cognitive communication deficit: Secondary | ICD-10-CM | POA: Diagnosis not present

## 2024-04-17 DIAGNOSIS — M6281 Muscle weakness (generalized): Secondary | ICD-10-CM | POA: Diagnosis not present

## 2024-04-17 DIAGNOSIS — S069X0S Unspecified intracranial injury without loss of consciousness, sequela: Secondary | ICD-10-CM | POA: Diagnosis not present

## 2024-04-18 DIAGNOSIS — M6281 Muscle weakness (generalized): Secondary | ICD-10-CM | POA: Diagnosis not present

## 2024-04-18 DIAGNOSIS — R2681 Unsteadiness on feet: Secondary | ICD-10-CM | POA: Diagnosis not present

## 2024-04-18 DIAGNOSIS — R41841 Cognitive communication deficit: Secondary | ICD-10-CM | POA: Diagnosis not present

## 2024-04-18 DIAGNOSIS — S069X0S Unspecified intracranial injury without loss of consciousness, sequela: Secondary | ICD-10-CM | POA: Diagnosis not present

## 2024-04-19 DIAGNOSIS — M6281 Muscle weakness (generalized): Secondary | ICD-10-CM | POA: Diagnosis not present

## 2024-04-19 DIAGNOSIS — R41841 Cognitive communication deficit: Secondary | ICD-10-CM | POA: Diagnosis not present

## 2024-04-19 DIAGNOSIS — R2681 Unsteadiness on feet: Secondary | ICD-10-CM | POA: Diagnosis not present

## 2024-04-19 DIAGNOSIS — S069X0S Unspecified intracranial injury without loss of consciousness, sequela: Secondary | ICD-10-CM | POA: Diagnosis not present

## 2024-04-22 DIAGNOSIS — R2681 Unsteadiness on feet: Secondary | ICD-10-CM | POA: Diagnosis not present

## 2024-04-22 DIAGNOSIS — S069X0S Unspecified intracranial injury without loss of consciousness, sequela: Secondary | ICD-10-CM | POA: Diagnosis not present

## 2024-04-22 DIAGNOSIS — R41841 Cognitive communication deficit: Secondary | ICD-10-CM | POA: Diagnosis not present

## 2024-04-23 DIAGNOSIS — M6281 Muscle weakness (generalized): Secondary | ICD-10-CM | POA: Diagnosis not present

## 2024-04-23 DIAGNOSIS — R41841 Cognitive communication deficit: Secondary | ICD-10-CM | POA: Diagnosis not present

## 2024-04-23 DIAGNOSIS — R2681 Unsteadiness on feet: Secondary | ICD-10-CM | POA: Diagnosis not present

## 2024-04-23 DIAGNOSIS — S069X0S Unspecified intracranial injury without loss of consciousness, sequela: Secondary | ICD-10-CM | POA: Diagnosis not present

## 2024-04-24 DIAGNOSIS — R41841 Cognitive communication deficit: Secondary | ICD-10-CM | POA: Diagnosis not present

## 2024-04-24 DIAGNOSIS — S069X0S Unspecified intracranial injury without loss of consciousness, sequela: Secondary | ICD-10-CM | POA: Diagnosis not present

## 2024-04-24 DIAGNOSIS — R2681 Unsteadiness on feet: Secondary | ICD-10-CM | POA: Diagnosis not present

## 2024-04-25 DIAGNOSIS — S069X0S Unspecified intracranial injury without loss of consciousness, sequela: Secondary | ICD-10-CM | POA: Diagnosis not present

## 2024-04-25 DIAGNOSIS — R2681 Unsteadiness on feet: Secondary | ICD-10-CM | POA: Diagnosis not present

## 2024-04-25 DIAGNOSIS — M6281 Muscle weakness (generalized): Secondary | ICD-10-CM | POA: Diagnosis not present

## 2024-04-25 DIAGNOSIS — R41841 Cognitive communication deficit: Secondary | ICD-10-CM | POA: Diagnosis not present

## 2024-04-26 DIAGNOSIS — M6281 Muscle weakness (generalized): Secondary | ICD-10-CM | POA: Diagnosis not present

## 2024-04-26 DIAGNOSIS — R2681 Unsteadiness on feet: Secondary | ICD-10-CM | POA: Diagnosis not present

## 2024-04-26 DIAGNOSIS — S069X0S Unspecified intracranial injury without loss of consciousness, sequela: Secondary | ICD-10-CM | POA: Diagnosis not present

## 2024-04-26 DIAGNOSIS — R41841 Cognitive communication deficit: Secondary | ICD-10-CM | POA: Diagnosis not present
